# Patient Record
Sex: Female | Born: 1970 | Race: White | Hispanic: No | Marital: Single | Smoking: Never smoker
Health system: Southern US, Community
[De-identification: ages and names within clinical notes are randomized; demographics above are authoritative.]

## PROBLEM LIST (undated history)

## (undated) DIAGNOSIS — E611 Iron deficiency: Secondary | ICD-10-CM

## (undated) DIAGNOSIS — J45909 Unspecified asthma, uncomplicated: Secondary | ICD-10-CM

## (undated) DIAGNOSIS — E785 Hyperlipidemia, unspecified: Secondary | ICD-10-CM

## (undated) DIAGNOSIS — K317 Polyp of stomach and duodenum: Secondary | ICD-10-CM

## (undated) DIAGNOSIS — J309 Allergic rhinitis, unspecified: Secondary | ICD-10-CM

## (undated) DIAGNOSIS — R195 Other fecal abnormalities: Secondary | ICD-10-CM

## (undated) DIAGNOSIS — B019 Varicella without complication: Secondary | ICD-10-CM

## (undated) DIAGNOSIS — D649 Anemia, unspecified: Secondary | ICD-10-CM

## (undated) DIAGNOSIS — T7840XA Allergy, unspecified, initial encounter: Secondary | ICD-10-CM

## (undated) DIAGNOSIS — K811 Chronic cholecystitis: Secondary | ICD-10-CM

## (undated) DIAGNOSIS — I7300 Raynaud's syndrome without gangrene: Secondary | ICD-10-CM

## (undated) DIAGNOSIS — I10 Essential (primary) hypertension: Secondary | ICD-10-CM

## (undated) DIAGNOSIS — U071 COVID-19: Secondary | ICD-10-CM

## (undated) DIAGNOSIS — E119 Type 2 diabetes mellitus without complications: Secondary | ICD-10-CM

## (undated) DIAGNOSIS — D126 Benign neoplasm of colon, unspecified: Secondary | ICD-10-CM

## (undated) DIAGNOSIS — N390 Urinary tract infection, site not specified: Secondary | ICD-10-CM

## (undated) DIAGNOSIS — K219 Gastro-esophageal reflux disease without esophagitis: Secondary | ICD-10-CM

## (undated) DIAGNOSIS — E215 Disorder of parathyroid gland, unspecified: Secondary | ICD-10-CM

## (undated) DIAGNOSIS — K760 Fatty (change of) liver, not elsewhere classified: Secondary | ICD-10-CM

## (undated) DIAGNOSIS — A0811 Acute gastroenteropathy due to Norwalk agent: Secondary | ICD-10-CM

## (undated) HISTORY — DX: Varicella without complication: B01.9

## (undated) HISTORY — DX: Fatty (change of) liver, not elsewhere classified: K76.0

## (undated) HISTORY — DX: Benign neoplasm of colon, unspecified: D12.6

## (undated) HISTORY — DX: Allergic rhinitis, unspecified: J30.9

## (undated) HISTORY — DX: Unspecified asthma, uncomplicated: J45.909

## (undated) HISTORY — DX: Type 2 diabetes mellitus without complications: E11.9

## (undated) HISTORY — DX: Allergy, unspecified, initial encounter: T78.40XA

## (undated) HISTORY — DX: Urinary tract infection, site not specified: N39.0

## (undated) HISTORY — DX: Iron deficiency: E61.1

## (undated) HISTORY — DX: Anemia, unspecified: D64.9

## (undated) HISTORY — DX: Chronic cholecystitis: K81.1

## (undated) HISTORY — DX: Gastro-esophageal reflux disease without esophagitis: K21.9

## (undated) HISTORY — PX: BREAST CYST EXCISION: SHX579

## (undated) HISTORY — DX: Hyperlipidemia, unspecified: E78.5

## (undated) HISTORY — PX: CHOLECYSTECTOMY: SHX55

## (undated) HISTORY — PX: POLYPECTOMY: SHX149

## (undated) HISTORY — DX: Disorder of parathyroid gland, unspecified: E21.5

## (undated) HISTORY — DX: COVID-19: U07.1

## (undated) HISTORY — DX: Raynaud's syndrome without gangrene: I73.00

## (undated) HISTORY — DX: Other fecal abnormalities: R19.5

## (undated) HISTORY — PX: COLONOSCOPY: SHX174

## (undated) HISTORY — DX: Essential (primary) hypertension: I10

## (undated) HISTORY — DX: Polyp of stomach and duodenum: K31.7

---

## 2000-09-27 ENCOUNTER — Other Ambulatory Visit: Admission: RE | Admit: 2000-09-27 | Discharge: 2000-09-27 | Payer: Self-pay | Admitting: Obstetrics and Gynecology

## 2000-09-27 ENCOUNTER — Other Ambulatory Visit: Admission: RE | Admit: 2000-09-27 | Discharge: 2000-09-27 | Payer: Self-pay | Admitting: Obstetrics & Gynecology

## 2001-02-23 ENCOUNTER — Encounter: Admission: RE | Admit: 2001-02-23 | Discharge: 2001-05-24 | Payer: Self-pay | Admitting: Obstetrics and Gynecology

## 2001-05-01 ENCOUNTER — Inpatient Hospital Stay (HOSPITAL_COMMUNITY): Admission: AD | Admit: 2001-05-01 | Discharge: 2001-05-01 | Payer: Self-pay | Admitting: Obstetrics & Gynecology

## 2001-05-05 ENCOUNTER — Inpatient Hospital Stay (HOSPITAL_COMMUNITY): Admission: AD | Admit: 2001-05-05 | Discharge: 2001-05-07 | Payer: Self-pay | Admitting: Obstetrics & Gynecology

## 2001-12-26 ENCOUNTER — Other Ambulatory Visit: Admission: RE | Admit: 2001-12-26 | Discharge: 2001-12-26 | Payer: Self-pay | Admitting: Obstetrics and Gynecology

## 2003-02-05 ENCOUNTER — Other Ambulatory Visit: Admission: RE | Admit: 2003-02-05 | Discharge: 2003-02-05 | Payer: Self-pay | Admitting: Internal Medicine

## 2006-01-18 ENCOUNTER — Ambulatory Visit (HOSPITAL_COMMUNITY): Admission: RE | Admit: 2006-01-18 | Discharge: 2006-01-18 | Payer: Self-pay | Admitting: Obstetrics & Gynecology

## 2006-02-17 ENCOUNTER — Encounter: Admission: RE | Admit: 2006-02-17 | Discharge: 2006-02-17 | Payer: Self-pay | Admitting: Obstetrics & Gynecology

## 2006-02-22 ENCOUNTER — Ambulatory Visit: Payer: Self-pay | Admitting: Cardiology

## 2006-03-06 ENCOUNTER — Ambulatory Visit: Payer: Self-pay | Admitting: Family Medicine

## 2006-05-16 HISTORY — PX: TUBAL LIGATION: SHX77

## 2006-08-30 ENCOUNTER — Encounter: Admission: RE | Admit: 2006-08-30 | Discharge: 2006-08-30 | Payer: Self-pay | Admitting: Obstetrics & Gynecology

## 2006-09-01 ENCOUNTER — Encounter: Admission: RE | Admit: 2006-09-01 | Discharge: 2006-09-01 | Payer: Self-pay | Admitting: Obstetrics & Gynecology

## 2007-05-17 HISTORY — PX: PARATHYROIDECTOMY: SHX19

## 2007-12-28 ENCOUNTER — Ambulatory Visit: Payer: Self-pay | Admitting: Anesthesiology

## 2008-03-06 ENCOUNTER — Encounter: Admission: RE | Admit: 2008-03-06 | Discharge: 2008-03-06 | Payer: Self-pay | Admitting: Obstetrics & Gynecology

## 2008-08-24 ENCOUNTER — Emergency Department (HOSPITAL_COMMUNITY): Admission: EM | Admit: 2008-08-24 | Discharge: 2008-08-24 | Payer: Self-pay | Admitting: Emergency Medicine

## 2010-08-25 LAB — URINALYSIS, ROUTINE W REFLEX MICROSCOPIC
Bilirubin Urine: NEGATIVE
Glucose, UA: NEGATIVE mg/dL
Hgb urine dipstick: NEGATIVE
Ketones, ur: NEGATIVE mg/dL
Specific Gravity, Urine: 1.013 (ref 1.005–1.030)
pH: 6 (ref 5.0–8.0)

## 2010-10-01 NOTE — Assessment & Plan Note (Signed)
[facility]                              CARDIOLOGY OFFICE NOTE   Nicole Bryant, Nicole Bryant             MRN:          [id number]  DATE:02/22/2006                            DOB:          Oct 11, 1970    REFERRING PHYSICIAN:  Freddy Finner, M.D.   REFERRING PHYSICIAN:  Freddy Finner, M.D.   REASON FOR CONSULTATION:  Dyslipidemia.   HISTORY OF PRESENT ILLNESS:  Nicole Bryant is a pleasant 40 year old  woman with a reported history of previously documented mild dyslipidemia,  although on no specific medical therapy.  She has had no problems with  hypertension or documented type 2 diabetes mellitus, although she does have  prior history of gestational diabetes.  She has had gastroesophageal reflux  and has used Pepcid over the counter with relief.  She states that she  recently underwent a tubal ligation and has not been exercising regularly,  although prior to that had a personal trainer and was doing exercise most  days of the week with a subsequent 15 pound weight loss and feeling well.  She had a recent lipid profile obtained as a matter of screening showing a  total cholesterol of 201, triglycerides 116, HDL 37, LDL 141 and a random  glucose of 104.  Thyroid studies were normal.  I reviewed these with her  today.  Her electrocardiogram showed sinus rhythm with sinus arrhythmia and  possible left atrial enlargement.   She has no exertional angina or limiting dyspnea on exertion.  Today, we  talked about lifestyle modification including strategies for weight loss  through diet and exercise.  We talked about her propensity for developing  the metabolic syndrome and I reviewed with her strategies for reducing her  risk down the road.   ALLERGIES:  SULFA.   PRESENT MEDICATIONS:  Over-the-counter Pepcid.   PAST MEDICAL HISTORY:  As outlined in the history of present illness.  She  is status post cyst removal from her left breast in  1991.   SOCIAL HISTORY:  The patient is married and has one child.  She is a  [employer] trader.  She denies any tobacco and alcohol use.  She drinks one to two  caffeinated beverages daily.   FAMILY HISTORY:  Noncontributory for premature cardiovascular disease.   REVIEW OF SYSTEMS:  As per the history of present illness.   PHYSICAL EXAMINATION:  Blood pressure 151/88, heart rate 83 and regular,  weight is not recorded.  The patient is overweight and in no acute distress.  HEENT:  Conjunctivae and lids are normal.  Pharynx is clear.  Neck is supple  without obvious jugular venous pressure or loud bruits.  LUNGS:  Clear without labored breathing.  CARDIAC:  Reveals a regular rate and rhythm without loud murmur or gallop.  EXTREMITIES:  Exhibit no significant pitting edema.   IMPRESSION/RECOMMENDATIONS:  1. Mild dyslipidemia as discussed above with low HDL and mildly elevated      LDL, although with normal triglycerides.  Random glucose was 104.  At      this point, I have discussed her propensity for developing metabolic      syndrome  and encouraged her to get back to a regular exercise regimen      which she had already dissipated doing.  We also talked about a healthy      diet with a goal of weight loss.  I would anticipate that she should      have a follow-up lipid panel most likely over the next six months.      Would not recommend any specific medical therapy at this particular      time.  She will plan to continue to follow up with Dr. Jennette Kettle.  If her      lipid status worsens or does not come under better control with      conservative measures, she can certainly come back for reassessment.  2. Blood pressure elevated today, although no longstanding history of      hypertension:  This should also be followed.  I have encouraged her to      continue to see Dr. Jennette Kettle for routine checks.       Jonelle Sidle, MD     SGM/MedQ  DD:  02/22/2006  DT:  02/23/2006  Job #:   [id number]   cc:   Freddy Finner, M.D.

## 2010-10-01 NOTE — Op Note (Signed)
NAMECHRISTIANN, Nicole Bryant      ACCOUNT NO.:  [id number]   MEDICAL RECORD NO.:  [id number]          PATIENT TYPE:  AMB   LOCATION:  [facility]                           FACILITY:  [facility]   PHYSICIAN:  Freddy Finner, M.D.   DATE OF BIRTH:  [birth year]   DATE OF PROCEDURE:  01/18/2006  DATE OF DISCHARGE:                                 OPERATIVE REPORT   PREOPERATIVE DIAGNOSIS:  Request for voluntary sterilization.   POSTOPERATIVE DIAGNOSES:  1. Request for voluntary sterilization.  2. Boggy enlargement of uterus with no other evidence of pelvic pathology      or upper abdominal abnormality.   OPERATIVE PROCEDURE:  Laparoscopic tubal occlusion with Filshie clips.   ANESTHESIA:  General.   SURGEON:  Freddy Finner, M.D.   INTRAOPERATIVE COMPLICATIONS:  None.   Patient is a 40 year old with one living child who has requested permanent  surgical sterilization.  Other options have been offered to her, including  an IUD, which she has refused.  She is admitted now for laparoscopic  sterilization.   She was admitted on the morning of surgery and brought to the operating room  and placed under adequate general anesthesia, placed in the dorsal lithotomy  position using the Allen stirrups.  Betadine prep of the abdomen, perineum,  and vagina was carried out in the usual fashion.  The bladder was evacuated  with a Robinson catheter.  Hulka tenaculum was attached to the cervix under  direct visualization.  Sterile drapes were applied.  A small internal  umbilical skin incision was made, through which the 11 mm nonbloody  disposable trocar was introduced while elevating the anterior abdominal wall  manually.  Direct inspection revealed adequate placement with no evidence of  injury on entry.  Pneumoperitoneum was allowed to accumulate with carbon  dioxide gas.  Systematic examination of the pelvic and abdominal contents  were carried out, and findings are noted above and recorded.  Photographs  were obtained in the office record.  The isthmus portion of each fallopian  tube was occluded with a Filshie clip without difficulty or without intra-  abdominal bleeding.  Carbon dioxide gas was then allowed to escape from the  abdomen.  The skin incision was closed with interrupted subcuticular sutures  of 3-0 Dexon, and 0.5% plain Marcaine was injected through the incision  sites for postoperative analgesia.  Toradol was given IV and IM 30 mg at  each location.  Immediately on completion of the procedure, she was taken to  the recovery room in good condition.  She is given Vicodin to be taken as  needed for postoperative pain.  She is to have routine outpatient surgical  instruction.  She is to follow up in the office in approximately two weeks  for postoperative visit.     Freddy Finner, M.D.  Electronically Signed    WRN/MEDQ  D:  01/18/2006  T:  01/18/2006  Job:  [id number]

## 2014-08-27 ENCOUNTER — Ambulatory Visit
Admit: 2014-08-27 | Disposition: A | Discharge status: Home / Self Care | Payer: Self-pay | Attending: Family Medicine | Admitting: Family Medicine

## 2014-09-30 ENCOUNTER — Encounter: Payer: Self-pay | Admitting: *Deleted

## 2015-06-29 ENCOUNTER — Ambulatory Visit: Payer: Self-pay | Admitting: Family Medicine

## 2015-07-06 ENCOUNTER — Ambulatory Visit: Payer: Self-pay | Admitting: Family Medicine

## 2015-07-07 ENCOUNTER — Encounter: Payer: Self-pay | Admitting: Family Medicine

## 2015-07-07 ENCOUNTER — Ambulatory Visit (INDEPENDENT_AMBULATORY_CARE_PROVIDER_SITE_OTHER): Payer: BLUE CROSS/BLUE SHIELD | Admitting: Family Medicine

## 2015-07-07 VITALS — BP 130/80 | HR 81 | Temp 98.5°F | Ht 62.5 in | Wt 194.8 lb

## 2015-07-07 DIAGNOSIS — R1013 Epigastric pain: Secondary | ICD-10-CM | POA: Diagnosis not present

## 2015-07-07 DIAGNOSIS — R0602 Shortness of breath: Secondary | ICD-10-CM | POA: Diagnosis not present

## 2015-07-07 DIAGNOSIS — E119 Type 2 diabetes mellitus without complications: Secondary | ICD-10-CM | POA: Diagnosis not present

## 2015-07-07 DIAGNOSIS — R7989 Other specified abnormal findings of blood chemistry: Secondary | ICD-10-CM

## 2015-07-07 LAB — LIPID PANEL
CHOL/HDL RATIO: 6
CHOLESTEROL: 214 mg/dL — AB (ref 0–200)
HDL: 37.8 mg/dL — ABNORMAL LOW (ref >39.00)
NONHDL: 176.49
Triglycerides: 320 mg/dL — ABNORMAL HIGH (ref 0.0–149.0)
VLDL: 64 mg/dL — ABNORMAL HIGH (ref 0.0–40.0)

## 2015-07-07 LAB — CBC
HCT: 42.3 % (ref 36.0–46.0)
HEMOGLOBIN: 13.9 g/dL (ref 12.0–15.0)
MCHC: 32.9 g/dL (ref 30.0–36.0)
MCV: 83.6 fl (ref 78.0–100.0)
PLATELETS: 244 10*3/uL (ref 150.0–400.0)
RBC: 5.07 Mil/uL (ref 3.87–5.11)
RDW: 14.5 % (ref 11.5–15.5)
WBC: 7 10*3/uL (ref 4.0–10.5)

## 2015-07-07 LAB — COMPREHENSIVE METABOLIC PANEL
ALK PHOS: 59 U/L (ref 39–117)
ALT: 15 U/L (ref 0–35)
AST: 16 U/L (ref 0–37)
Albumin: 4.5 g/dL (ref 3.5–5.2)
BUN: 8 mg/dL (ref 6–23)
CO2: 29 mEq/L (ref 19–32)
Calcium: 9.6 mg/dL (ref 8.4–10.5)
Chloride: 101 mEq/L (ref 96–112)
Creatinine, Ser: 0.76 mg/dL (ref 0.40–1.20)
GFR: 87.58 mL/min (ref >60.00)
GLUCOSE: 195 mg/dL — AB (ref 70–99)
POTASSIUM: 4.2 meq/L (ref 3.5–5.1)
SODIUM: 139 meq/L (ref 135–145)
TOTAL PROTEIN: 7.4 g/dL (ref 6.0–8.3)
Total Bilirubin: 0.8 mg/dL (ref 0.2–1.2)

## 2015-07-07 LAB — VITAMIN D 25 HYDROXY (VIT D DEFICIENCY, FRACTURES): VITD: 57.13 ng/mL (ref 30.00–100.00)

## 2015-07-07 LAB — HEMOGLOBIN A1C: Hgb A1c MFr Bld: 7.6 % — ABNORMAL HIGH (ref 4.6–6.5)

## 2015-07-07 LAB — LDL CHOLESTEROL, DIRECT: LDL DIRECT: 130 mg/dL

## 2015-07-07 LAB — LIPASE: Lipase: 21 U/L (ref 11.0–59.0)

## 2015-07-07 LAB — POCT URINE PREGNANCY: PREG TEST UR: NEGATIVE

## 2015-07-07 MED ORDER — FLUTICASONE PROPIONATE 50 MCG/ACT NA SUSP: 2.0000 | Freq: Every day | NASAL | Status: DC

## 2015-07-07 MED ORDER — PANTOPRAZOLE SODIUM 40 MG PO TBEC: 40.0000 mg | DELAYED_RELEASE_TABLET | Freq: Every day | ORAL | Status: DC

## 2015-07-07 NOTE — Assessment & Plan Note (Addendum)
History is most consistent with gastritis versus GERD, though patient does report a mild sensation of shortness of breath with this. Given this an EKG was obtained and was reassuring. Doubt cardiac cause given history of reflux and reassuring EKG and atypical symptoms. Benign lung exam. No history of DVT or PE. No leg swelling. Unlikely VTE. Mild epigastric tenderness on abdominal exam. We will check a CMP, lipase, and CBC to evaluate this further. Urine pregnancy was negative. We will start her on Protonix 40 mg daily. She'll continue to monitor. She is given return precautions.

## 2015-07-07 NOTE — Patient Instructions (Signed)
Nice to see you. We will treat your stomach discomfort with Protonix. Please continue to monitor this. We will also check some lab work to evaluate this further. If you develop worsening pain, nausea, vomiting, diarrhea, blood in her stool, urinary symptoms, vaginal bleeding, chest pain, shortness of breath, or any new or changing symptoms please seek medical attention.

## 2015-07-07 NOTE — Progress Notes (Signed)
Pre visit review using our clinic review tool, if applicable. No additional management support is needed unless otherwise documented below in the visit note. 

## 2015-07-07 NOTE — Assessment & Plan Note (Signed)
Reportedly diet controlled. We'll check an A1c and lipid panel today.

## 2015-07-07 NOTE — Progress Notes (Signed)
Patient ID: Nicole Bryant, female   DOB: 1970/11/16, 45 y.o.   MRN: [id number]  [name], MD Phone: [phone]  Nicole Bryant is a 45 y.o. female who presents today for new patient visit.  Epigastric discomfort: Patient notes history of GERD and gastritis. She notes for the last several days her upper abdomen has felt like it is on fire. She notes nausea with this. She notes no reflux symptoms though does have mild mucus and cough with this. States this feels like her reflux. She notes mild shortness of breath as well that she describes as needing to take a deep breath when she feels the pain in her stomach. She also notes mild shortness of breath with activity and history of bronchitis. She denies chest pain. She notes the stomach discomfort is mildly worse when active. She notes monthly periods with her last period being last Thursday. She takes Prilosec 40 mg daily for this. No history of cardiac issues in her. Does have family history of MI in a grandfather and uncle in their 13s. Father had a brain aneurysm. She takes Aleve daily. No shortness of breath at this time.  DIABETES Disease Monitoring: Blood Sugar ranges-not checking Polyuria/phagia/dipsia- no      Visual problems- no Not currently on medications.   Active Ambulatory Problems    Diagnosis Date Noted  . Abdominal pain, epigastric 07/07/2015  . Diabetes ([place]) 07/07/2015   Resolved Ambulatory Problems    Diagnosis Date Noted  . No Resolved Ambulatory Problems   Past Medical History  Diagnosis Date  . Chickenpox   . GERD (gastroesophageal reflux disease)   . Allergic rhinitis   . Parathyroid abnormality ([place])   . UTI (lower urinary tract infection)     Family History  Problem Relation Age of Onset  . Stroke Father   . Heart disease      Grandfather, uncle  . Hypertension    . Diabetes      Social History   Social History  . Marital Status: Married    Spouse Name: N/A  . Number of  Children: N/A  . Years of Education: N/A   Occupational History  . Not on file.   Social History Main Topics  . Smoking status: Never Smoker   . Smokeless tobacco: Not on file  . Alcohol Use: No  . Drug Use: No  . Sexual Activity: Not on file   Other Topics Concern  . Not on file   Social History Narrative  . No narrative on file    ROS   General:  Negative for nexplained weight loss, fever Skin: Negative for new or changing mole, sore that won't heal HEENT: Negative for trouble hearing, trouble seeing, ringing in ears, mouth sores, hoarseness, change in voice, dysphagia. CV:  Positive for dyspnea, Negative for chest pain, edema, palpitations Resp: Negative for cough, hemoptysis GI: Positive for abdominal pain and nausea, Negative for vomiting, diarrhea, constipation, melena, hematochezia. GU: Negative for dysuria, incontinence, urinary hesitance, hematuria, vaginal or penile discharge, polyuria, sexual difficulty, lumps in testicle or breasts MSK: Negative for muscle cramps or aches, joint pain or swelling Neuro: Negative for headaches, weakness, numbness, dizziness, passing out/fainting Psych: Negative for depression, anxiety, memory problems  Objective  Physical Exam Filed Vitals:   07/07/15 1026  BP: 130/80  Pulse: 81  Temp: 98.5 F (36.9 C)    BP Readings from Last 3 Encounters:  07/07/15 130/80   Wt Readings from Last 3 Encounters:  07/07/15 194  lb 12.8 oz (88.361 kg)    Physical Exam  Constitutional: She is well-developed, well-nourished, and in no distress.  HENT:  Head: Normocephalic.  Right Ear: External ear normal.  Left Ear: External ear normal.  Mouth/Throat: Oropharynx is clear and moist. No oropharyngeal exudate.  Eyes: Conjunctivae are normal. Pupils are equal, round, and reactive to light.  Neck: Neck supple.  Cardiovascular: Normal rate, regular rhythm and normal heart sounds.  Exam reveals no gallop and no friction rub.   No murmur  heard. Pulmonary/Chest: Effort normal and breath sounds normal. No respiratory distress. She has no wheezes. She has no rales.  Abdominal: Soft. Bowel sounds are normal. She exhibits no distension. There is tenderness (mild epigastric). There is no rebound and no guarding.  Musculoskeletal: She exhibits no edema.  Lymphadenopathy:    She has no cervical adenopathy.  Neurological: She is alert. Gait normal.  Skin: Skin is warm and dry. She is not diaphoretic.  Psychiatric: Mood and affect normal.   EKG: Normal sinus rhythm, rate 72, no ST or T-wave changes  Assessment/Plan:   Abdominal pain, epigastric History is most consistent with gastritis versus GERD, though patient does report a mild sensation of shortness of breath with this. Given this an EKG was obtained and was reassuring. Doubt cardiac cause given history of reflux and reassuring EKG and atypical symptoms. Benign lung exam. No history of DVT or PE. No leg swelling. Unlikely VTE. Mild epigastric tenderness on abdominal exam. We will check a CMP, lipase, and CBC to evaluate this further. Urine pregnancy was negative. We will start her on Protonix 40 mg daily. She'll continue to monitor. She is given return precautions.  Diabetes ([place]) Reportedly diet controlled. We'll check an A1c and lipid panel today.    Orders Placed This Encounter  Procedures  . Comp Met (CMET)  . Lipase  . CBC  . HgB A1c  . Lipid Profile  . Vitamin D (25 hydroxy)  . POCT urine pregnancy  . EKG 12-Lead    [name]

## 2015-07-08 ENCOUNTER — Telehealth: Payer: Self-pay | Admitting: Family Medicine

## 2015-07-08 NOTE — Telephone Encounter (Signed)
Spoke with patient and she is still having the abdominal pain. She said that when she eats or drinks anything that she is still having the burning. When she moves she is feeling nauseous. She took the Protonix yesterday and has taken it ago today.

## 2015-07-08 NOTE — Telephone Encounter (Signed)
Patients symptoms are likely related to gastritis. Her lab work from yesterday was unrevealing for a cause. She had no abnormalities of her pancreas or liver. Please ensure that her symptoms are the same as they were previously and have not changed. She could try taking Tums to see if this would help or we can send in Carafate for the patient to try. We could also consider obtaining an ultrasound of her stomach to evaluate further. Please additionally let her know that her cholesterol was elevated and her triglycerides were elevated. Given that she has diabetes she would benefit from treatment with a statin to decrease her cholesterol. Please see if she is willing to start on this. She would additionally benefit from going on medication for her diabetes. Her A1c was 7.6. Please see if she is willing to start metformin. Thanks.

## 2015-07-08 NOTE — Telephone Encounter (Signed)
Noted. If she worsens or develops new symptoms she should be re-evaluated. Thanks.

## 2015-07-08 NOTE — Telephone Encounter (Signed)
Patient is going to try the Tums for right now. Her symptoms have not gotten any worse from yesterday. She would like to hold off on starting the Metformin and cholesterol medication until she comes in for her follow up appointment.

## 2015-07-08 NOTE — Telephone Encounter (Signed)
Pt called back she was seen yesterday and states she is not feeling better. Pt is feeling nauseous. Call pt @ [phone]. Thank you!

## 2015-07-14 ENCOUNTER — Ambulatory Visit (INDEPENDENT_AMBULATORY_CARE_PROVIDER_SITE_OTHER): Payer: BLUE CROSS/BLUE SHIELD | Admitting: Family Medicine

## 2015-07-14 ENCOUNTER — Encounter: Payer: Self-pay | Admitting: Family Medicine

## 2015-07-14 ENCOUNTER — Encounter: Payer: Self-pay | Admitting: *Deleted

## 2015-07-14 ENCOUNTER — Telehealth: Payer: Self-pay

## 2015-07-14 VITALS — BP 128/86 | HR 88 | Temp 98.3°F | Ht 62.5 in | Wt 192.2 lb

## 2015-07-14 DIAGNOSIS — R1013 Epigastric pain: Secondary | ICD-10-CM | POA: Diagnosis not present

## 2015-07-14 MED ORDER — SUCRALFATE 1 GM/10ML PO SUSP: 1.0000 g | Freq: Three times a day (TID) | ORAL | Status: DC

## 2015-07-14 MED ORDER — SUCRALFATE 1 G PO TABS: 1.0000 g | ORAL_TABLET | Freq: Three times a day (TID) | ORAL | Status: DC

## 2015-07-14 NOTE — Telephone Encounter (Signed)
Todd with [facility] left v/m carafate suspension is needing PA and tablet form should not require PA; Todd request resubmit carafate in tablet form.

## 2015-07-14 NOTE — Telephone Encounter (Signed)
I have not received any PA request from pharmacy for Carafate.  Will await fax.

## 2015-07-14 NOTE — Progress Notes (Signed)
Pre visit review using our clinic review tool, if applicable. No additional management support is needed unless otherwise documented below in the visit note. 

## 2015-07-14 NOTE — Addendum Note (Signed)
Addended by: Ellamae Sia on: 07/14/2015 10:21 AM   Modules accepted: Orders

## 2015-07-14 NOTE — Progress Notes (Signed)
   Subjective:    Patient ID: Nicole Bryant, female    DOB: 21-Feb-1971, 45 y.o.   MRN: [id number]  HPI   45 year old female  Pt  of Dr. Sallee Lange  with history of diabetes, GERD presents with continue abdominal pain.   She was seen by her PCP on 2/21 with similar complaints when she established care. She was taking aleve daily. At that time, she had epigastric pain, cough and mucus in throat, no reflux symptoms otherwise.  She was on prilosec 40 mg but was changed to protonix at that time.  CMP, cbc and lipase were normal. Nml EKG  Today she reports she has no  improvement on protonix ( no SE). Some worsening of symptoms  At times but  Definitely no better.  She has been eating bland diet, nonsmoker.  She has also tried pepcid in addition with no benefit.  Still with epigastric pain, metal taste in her mouth, no chest pain.  Nausea. No emesis, no diarrhea.    No endoscopy in past. Never seen GI.  Social History /Family History/Past Medical History reviewed and updated if needed.   Review of Systems  Constitutional: Negative for fever and fatigue.  HENT: Negative for ear pain.   Eyes: Negative for pain.  Respiratory: Negative for chest tightness and shortness of breath.   Cardiovascular: Negative for chest pain, palpitations and leg swelling.  Gastrointestinal: Positive for abdominal pain. Negative for blood in stool.  Genitourinary: Negative for dysuria.       Objective:   Physical Exam  Constitutional: Vital signs are normal. She appears well-developed and well-nourished. She is cooperative.  Non-toxic appearance. She does not appear ill. No distress.  HENT:  Head: Normocephalic.  Right Ear: Hearing, tympanic membrane, external ear and ear canal normal. Tympanic membrane is not erythematous, not retracted and not bulging.  Left Ear: Hearing, tympanic membrane, external ear and ear canal normal. Tympanic membrane is not erythematous, not retracted and not bulging.    Nose: No mucosal edema or rhinorrhea. Right sinus exhibits no maxillary sinus tenderness and no frontal sinus tenderness. Left sinus exhibits no maxillary sinus tenderness and no frontal sinus tenderness.  Mouth/Throat: Uvula is midline, oropharynx is clear and moist and mucous membranes are normal.  Eyes: Conjunctivae, EOM and lids are normal. Pupils are equal, round, and reactive to light. Lids are everted and swept, no foreign bodies found.  Neck: Trachea normal and normal range of motion. Neck supple. Carotid bruit is not present. No thyroid mass and no thyromegaly present.  Cardiovascular: Normal rate, regular rhythm, S1 normal, S2 normal, normal heart sounds, intact distal pulses and normal pulses.  Exam reveals no gallop and no friction rub.   No murmur heard. Pulmonary/Chest: Effort normal and breath sounds normal. No tachypnea. No respiratory distress. She has no decreased breath sounds. She has no wheezes. She has no rhonchi. She has no rales.  Abdominal: Soft. Normal appearance and bowel sounds are normal. There is tenderness in the right upper quadrant and epigastric area.  Neurological: She is alert.  Skin: Skin is warm, dry and intact. No rash noted.  Psychiatric: Her speech is normal and behavior is normal. Judgment and thought content normal. Her mood appears not anxious. Cognition and memory are normal. She does not exhibit a depressed mood.          Assessment & Plan:

## 2015-07-14 NOTE — Assessment & Plan Note (Signed)
Will eval for Hypylori. Reviewed lifestyle changes. Start carafate, continue prpotonix. If neg testing and not improving recommend eval for gallbladder disease with RUQ [place] versus endoscopy.

## 2015-07-14 NOTE — Telephone Encounter (Signed)
Pt left v/m; pts pharmacy advised pt needs PA for carafate to be done; pt request cb with status of PA faxed from pharmacy; pt wants to pick up med ASAP.

## 2015-07-14 NOTE — Addendum Note (Signed)
Addended by: Marchia Bond on: 07/14/2015 02:10 PM   Modules accepted: Orders

## 2015-07-14 NOTE — Patient Instructions (Addendum)
Continue protonix. Start carafate to coat stomach.  Stop at lab on way put for H pylori test.  Avoid soda, alcohol, spicy, tomato, citris, peppermint., caffeine, chocolate.  Small meals size, don't wear tight clothing, work on weight loss. If negative, H pylori will plan on ultrasound eval.

## 2015-07-15 DIAGNOSIS — A0811 Acute gastroenteropathy due to Norwalk agent: Secondary | ICD-10-CM

## 2015-07-15 HISTORY — DX: Acute gastroenteropathy due to Norwalk agent: A08.11

## 2015-07-15 LAB — HELICOBACTER PYLORI  SPECIAL ANTIGEN: H. PYLORI Antigen: NOT DETECTED

## 2015-07-15 NOTE — Telephone Encounter (Signed)
[facility] notified Rx was changed to tablet form.  No PA required for tablets.

## 2015-07-16 ENCOUNTER — Telehealth: Payer: Self-pay | Admitting: Family Medicine

## 2015-07-16 NOTE — Telephone Encounter (Signed)
Pt called requesting call back with lab results. Please advise.  [phone]

## 2015-07-21 ENCOUNTER — Other Ambulatory Visit: Payer: Self-pay | Admitting: Family Medicine

## 2015-07-21 MED ORDER — SUCRALFATE 1 G PO TABS: 1.0000 g | ORAL_TABLET | Freq: Three times a day (TID) | ORAL | Status: DC

## 2015-07-21 NOTE — Telephone Encounter (Signed)
Last office visit 07/14/2015 with Dr. Diona Browner.  Last filled 07/14/2015 for #30 with no refills with instructions to take four times a day.  Refill?

## 2015-07-29 ENCOUNTER — Other Ambulatory Visit: Payer: Self-pay | Admitting: Family Medicine

## 2015-07-29 MED ORDER — SUCRALFATE 1 G PO TABS: 1.0000 g | ORAL_TABLET | Freq: Three times a day (TID) | ORAL | Status: DC

## 2015-07-29 NOTE — Telephone Encounter (Signed)
We will refill. Patient needs follow-up appointment for this issue.

## 2015-07-30 ENCOUNTER — Encounter: Payer: Self-pay | Admitting: Family Medicine

## 2015-08-03 ENCOUNTER — Encounter: Payer: Self-pay | Admitting: Family Medicine

## 2015-08-03 ENCOUNTER — Ambulatory Visit (INDEPENDENT_AMBULATORY_CARE_PROVIDER_SITE_OTHER): Payer: BLUE CROSS/BLUE SHIELD | Admitting: Family Medicine

## 2015-08-03 VITALS — BP 124/82 | HR 78 | Temp 98.5°F | Ht 65.25 in | Wt 191.2 lb

## 2015-08-03 DIAGNOSIS — R195 Other fecal abnormalities: Secondary | ICD-10-CM

## 2015-08-03 DIAGNOSIS — R1013 Epigastric pain: Secondary | ICD-10-CM | POA: Diagnosis not present

## 2015-08-03 DIAGNOSIS — K5900 Constipation, unspecified: Secondary | ICD-10-CM

## 2015-08-03 DIAGNOSIS — E119 Type 2 diabetes mellitus without complications: Secondary | ICD-10-CM

## 2015-08-03 DIAGNOSIS — E785 Hyperlipidemia, unspecified: Secondary | ICD-10-CM

## 2015-08-03 HISTORY — DX: Other fecal abnormalities: R19.5

## 2015-08-03 MED ORDER — RELION LANCETS STANDARD 21G MISC: Status: DC

## 2015-08-03 MED ORDER — PANTOPRAZOLE SODIUM 40 MG PO TBEC: 40.0000 mg | DELAYED_RELEASE_TABLET | Freq: Two times a day (BID) | ORAL | Status: DC

## 2015-08-03 MED ORDER — RELION CONFIRM GLUCOSE MONITOR W/DEVICE KIT: PACK | Status: AC

## 2015-08-03 MED ORDER — ATORVASTATIN CALCIUM 40 MG PO TABS: 40.0000 mg | ORAL_TABLET | Freq: Every day | ORAL | Status: DC

## 2015-08-03 MED ORDER — GLUCOSE BLOOD VI STRP: ORAL_STRIP | Status: DC

## 2015-08-03 MED ORDER — POLYETHYLENE GLYCOL 3350 17 GM/SCOOP PO POWD: 17.0000 g | Freq: Every day | ORAL | Status: DC

## 2015-08-03 NOTE — Progress Notes (Signed)
Pre visit review using our clinic review tool, if applicable. No additional management support is needed unless otherwise documented below in the visit note. 

## 2015-08-03 NOTE — Assessment & Plan Note (Signed)
Patient notes constipation since last week. No BM since Thursday. Normoactive bowel sounds. Constipation could be contributing to her abdominal discomfort. She will continue Colace and we will start her on MiraLAX. She did have positive FOBT which could be related to her constipation or other cause. We will refer her to GI for her positive FOBT and evaluation of her abdominal discomfort. She did recently have a CBC with a normal hemoglobin. Her vital signs are stable. Doubt significant blood loss. She will monitor for further bleeding. Given return precautions.

## 2015-08-03 NOTE — Assessment & Plan Note (Signed)
Patient with continued discomfort in her epigastric region. Mild tenderness on palpation. No peritoneal signs. Not associated with food intake so doubt gallbladder pathology. Suspect gastritis or gastric ulcer. We will increase Protonix to twice daily. Given persistence we will have the patient see GI. She will continue to monitor. She is given return precautions.

## 2015-08-03 NOTE — Assessment & Plan Note (Signed)
No gross blood on rectal exam. Positive FOBT. Recent normal hemoglobin. Stable vital signs make significant blood loss unlikely. Could be related to her constipation and potential hemorrhoids or fissure though no abnormalities seen on exam. We will refer to GI for further evaluation. Given return precautions.

## 2015-08-03 NOTE — Patient Instructions (Signed)
Nice to see you. We will refer you to GI for further evaluation. We will start her on MiraLAX for constipation. We will start her on Lipitor for your cholesterol. Please work on diet and exercise for your Diabetes. If you develop worsening abdominal pain, worsening nausea, or vomiting, diarrhea, blood in her stool, or any new or changing symptoms please seek medical attention.

## 2015-08-03 NOTE — Progress Notes (Signed)
Patient ID: Nicole Bryant, female   DOB: 01-Jul-1970, 45 y.o.   MRN: [id number]  [name], MD Phone: [phone]  Nicole Bryant is a 45 y.o. female who presents today for f/u.  DIABETES Disease Monitoring: Blood Sugar ranges-not checking Polyuria/phagia/dipsia- no    Currently working on diet and exercise. Not on medication.  HYPERLIPIDEMIA Symptoms Chest pain on exertion:  no   Leg claudication:   no Currently not taking medication. She is not eating a significant amount of food at this time given her nausea related to epigastric discomfort. She is not exercising either.  Epigastric pain: Patient notes this is unchanged from previously. She notes Carafate previously prescribed helped the first go round though has not helped since then. She is taking Protonix. She notes some nausea specifically with smelling food. She does not a sour taste in her throat as well. Discomfort is in the upper half of her abdomen in the epigastric area. Some cramping discomfort. No vomiting or diarrhea. Does note some constipation starting last Thursday. No BM since that time. Small quarter-sized amount of blood in her stool one time last week. Is taking Colace starting last night. No prior constipation. No urinary symptoms. LMP was last week. She notes abdominal discomfort does not worsen with eating. She does get more nauseous after eating. No right upper quadrant pain. She has had a tubal ligation.   PMH: nonsmoker    ROS see history of present illness  Objective  Physical Exam Filed Vitals:   08/03/15 1026  BP: 124/82  Pulse: 78  Temp: 98.5 F (36.9 C)    BP Readings from Last 3 Encounters:  08/03/15 124/82  07/14/15 128/86  07/07/15 130/80   Wt Readings from Last 3 Encounters:  08/03/15 191 lb 3.2 oz (86.728 kg)  07/14/15 192 lb 4 oz (87.204 kg)  07/07/15 194 lb 12.8 oz (88.361 kg)    Physical Exam  Constitutional: She is well-developed, well-nourished, and in  no distress.  HENT:  Head: Normocephalic and atraumatic.  Right Ear: External ear normal.  Left Ear: External ear normal.  Cardiovascular: Normal rate, regular rhythm and normal heart sounds.  Exam reveals no gallop and no friction rub.   No murmur heard. Pulmonary/Chest: Effort normal and breath sounds normal. No respiratory distress. She has no wheezes. She has no rales.  Abdominal: Soft. Bowel sounds are normal. She exhibits no distension. There is tenderness (mild epigastric discomfort). There is no rebound and no guarding.  Genitourinary: Guaiac positive stool.  Normal-appearing rectum, normal rectal tone, stool felt in the colon, no gross blood on exam  Musculoskeletal: She exhibits no edema.  Neurological: She is alert. Gait normal.  Skin: Skin is warm and dry. She is not diaphoretic.     Assessment/Plan: Please see individual problem list.  Abdominal pain, epigastric Patient with continued discomfort in her epigastric region. Mild tenderness on palpation. No peritoneal signs. Not associated with food intake so doubt gallbladder pathology. Suspect gastritis or gastric ulcer. We will increase Protonix to twice daily. Given persistence we will have the patient see GI. She will continue to monitor. She is given return precautions.  Diabetes ([place]) A1c slightly above goal. Patient is not currently taking any medications for this. I discussed starting medication though she wants to work on diet and exercise and recheck in 2 months. Advised on diet and exercise.  Hyperlipidemia Discussed elevated triglycerides and LDL cholesterol. Given her history of diabetes I discussed recommendation that she be placed on a  statin to decrease risk of cardiovascular events in the future. She is amenable to this. We will start her on Lipitor 40 mg daily.  Constipation Patient notes constipation since last week. No BM since Thursday. Normoactive bowel sounds. Constipation could be contributing to her  abdominal discomfort. She will continue Colace and we will start her on MiraLAX. She did have positive FOBT which could be related to her constipation or other cause. We will refer her to GI for her positive FOBT and evaluation of her abdominal discomfort. She did recently have a CBC with a normal hemoglobin. Her vital signs are stable. Doubt significant blood loss. She will monitor for further bleeding. Given return precautions.  Guaiac positive stools No gross blood on rectal exam. Positive FOBT. Recent normal hemoglobin. Stable vital signs make significant blood loss unlikely. Could be related to her constipation and potential hemorrhoids or fissure though no abnormalities seen on exam. We will refer to GI for further evaluation. Given return precautions.    Orders Placed This Encounter  Procedures  . Ambulatory referral to Gastroenterology    Referral Priority:  Routine    Referral Type:  Consultation    Referral Reason:  Specialty Services Required    Number of Visits Requested:  1    Meds ordered this encounter  Medications  . polyethylene glycol powder (GLYCOLAX/MIRALAX) powder    Sig: Take 17 g by mouth daily.    Dispense:  3350 g    Refill:  1  . atorvastatin (LIPITOR) 40 MG tablet    Sig: Take 1 tablet (40 mg total) by mouth daily.    Dispense:  90 tablet    Refill:  3  . RELION LANCETS STANDARD 21G MISC    Sig: Check your blood sugar once daily before breakfast.    Dispense:  200 each    Refill:  0  . Blood Glucose Monitoring Suppl (RELION CONFIRM GLUCOSE MONITOR) w/Device KIT    Sig: Check your blood glucose once daily before breakfast    Dispense:  1 kit    Refill:  0  . glucose blood (RELION GLUCOSE TEST STRIPS) test strip    Sig: Check your blood glucose once daily before breakfast    Dispense:  100 each    Refill:  12    [name], MD [place]

## 2015-08-03 NOTE — Assessment & Plan Note (Signed)
Discussed elevated triglycerides and LDL cholesterol. Given her history of diabetes I discussed recommendation that she be placed on a statin to decrease risk of cardiovascular events in the future. She is amenable to this. We will start her on Lipitor 40 mg daily.

## 2015-08-03 NOTE — Assessment & Plan Note (Signed)
A1c slightly above goal. Patient is not currently taking any medications for this. I discussed starting medication though she wants to work on diet and exercise and recheck in 2 months. Advised on diet and exercise.

## 2015-08-04 ENCOUNTER — Encounter: Payer: Self-pay | Admitting: Internal Medicine

## 2015-08-05 ENCOUNTER — Ambulatory Visit: Payer: BLUE CROSS/BLUE SHIELD | Admitting: Family Medicine

## 2015-08-10 ENCOUNTER — Ambulatory Visit (INDEPENDENT_AMBULATORY_CARE_PROVIDER_SITE_OTHER): Payer: BLUE CROSS/BLUE SHIELD | Admitting: Internal Medicine

## 2015-08-10 ENCOUNTER — Encounter: Payer: Self-pay | Admitting: Internal Medicine

## 2015-08-10 ENCOUNTER — Ambulatory Visit (INDEPENDENT_AMBULATORY_CARE_PROVIDER_SITE_OTHER): Payer: BLUE CROSS/BLUE SHIELD | Admitting: Family Medicine

## 2015-08-10 ENCOUNTER — Encounter: Payer: Self-pay | Admitting: Family Medicine

## 2015-08-10 ENCOUNTER — Other Ambulatory Visit (INDEPENDENT_AMBULATORY_CARE_PROVIDER_SITE_OTHER): Payer: BLUE CROSS/BLUE SHIELD

## 2015-08-10 VITALS — BP 138/88 | HR 88 | Ht 64.0 in | Wt 186.2 lb

## 2015-08-10 VITALS — BP 136/88 | HR 97 | Temp 99.0°F | Ht 62.5 in | Wt 187.0 lb

## 2015-08-10 DIAGNOSIS — R6881 Early satiety: Secondary | ICD-10-CM

## 2015-08-10 DIAGNOSIS — R194 Change in bowel habit: Secondary | ICD-10-CM | POA: Diagnosis not present

## 2015-08-10 DIAGNOSIS — R110 Nausea: Secondary | ICD-10-CM

## 2015-08-10 DIAGNOSIS — R195 Other fecal abnormalities: Secondary | ICD-10-CM | POA: Diagnosis not present

## 2015-08-10 DIAGNOSIS — R109 Unspecified abdominal pain: Secondary | ICD-10-CM

## 2015-08-10 DIAGNOSIS — R059 Cough, unspecified: Secondary | ICD-10-CM

## 2015-08-10 DIAGNOSIS — R509 Fever, unspecified: Secondary | ICD-10-CM

## 2015-08-10 DIAGNOSIS — J111 Influenza due to unidentified influenza virus with other respiratory manifestations: Secondary | ICD-10-CM | POA: Diagnosis not present

## 2015-08-10 DIAGNOSIS — R05 Cough: Secondary | ICD-10-CM | POA: Diagnosis not present

## 2015-08-10 LAB — POCT INFLUENZA A/B
INFLUENZA A, POC: POSITIVE — AB
Influenza B, POC: POSITIVE — AB

## 2015-08-10 LAB — IGA: IGA: 317 mg/dL (ref 68–378)

## 2015-08-10 LAB — TSH: TSH: 2.51 u[IU]/mL (ref 0.35–4.50)

## 2015-08-10 MED ORDER — OSELTAMIVIR PHOSPHATE 75 MG PO CAPS: 75.0000 mg | ORAL_CAPSULE | Freq: Two times a day (BID) | ORAL | Status: DC

## 2015-08-10 NOTE — Assessment & Plan Note (Addendum)
Patient's symptoms consistent with influenza and she had a positive rapid flu in the office today. Given her history of diabetes we will proceed with treatment with Tamiflu. Discussed staying well-hydrated. Tylenol for discomfort. She has cough syrup at home that she will use. She notes she had itching with the previously that was never had an anaphylactic-like reaction to any anti-tussive medications. Discussed that she should monitor she has recurrence of itching she should let us know. Of note patient was accompanied by a friend that has spent the majority of the last 5 days with her and is additionally a patient in the clinic, her name is Chief of Staff. I discussed potential prophylaxis with Tamiflu with my patient and Caren Griffins and reviewed Cynthia's chart as well and Caren Griffins opted to proceed with prophylactic Tamiflu with a paper prescription provided to [place]. Caren Griffins was advised that if she develops symptoms she should seek medical attention. My patient was given return precautions.

## 2015-08-10 NOTE — Patient Instructions (Signed)
Nice to see you. You have the flu. We will treat you with Tamiflu. You should stay well hydrated by drinking plenty of fluids. You can take Tylenol for any discomfort. Dry the cough syrup you have at home. If you have itching with a please let us know. If you develop chest pain, shortness of breath, cough productive of bloody, persistent fevers, or any new or changing symptoms please seek medical attention.

## 2015-08-10 NOTE — Patient Instructions (Addendum)
Please go to your primary care and get swabbed for the flu TODAY!  You have been scheduled for an endoscopy and colonoscopy on 09/16/15, Wednesday at 2:30 pm.   You have been scheduled for a previsit to get instructions for procedure on Wednesday, 09/02/15 @ 9 am.  We have sent the following medications to your pharmacy for you to pick up at your convenience: Zofran 4 mg every 6 hours as needed  Continue your Protonix.  Your physician has requested that you go to the basement for the following lab work before leaving today: Celiac, TSH  You have been scheduled for an abdominal ultrasound at [facility] (1st floor of hospital) on 08/21/15 at 8:00 am. Please arrive 15 minutes prior to your appointment for registration. Make certain not to have anything to eat or drink 6 hours prior to your appointment. Should you need to reschedule your appointment, please contact radiology at [phone]. This test typically takes about 30 minutes to perform.

## 2015-08-10 NOTE — Progress Notes (Signed)
Patient ID: Nicole Bryant, female   DOB: [birth year], 45 y.o.   MRN: [id number] HPI: Nicole Bryant is a 45 year old female with a past medical history of diabetes, GERD, hyperlipidemia and Raynaud's disease who is seen in consultation at the request of Dr. Caryl Bis to evaluate abdominal pain and heme positive stool. She reports that she is had 6 weeks of epigastric and mid abdominal pain as well as tightness in her chest. This is worse with eating. She's had early satiety and nausea. She's been eating dramatically less amount of food and has lost 10 pounds. She's had long-standing reflux disease and was on omeprazole 40 mg daily which is been changed to pantoprazole 40 mg twice daily. She feels some breakthrough heartburn despite the new twice daily pantoprazole. About a month ago she was taking Aleve daily for Raynaud's pain but stopped on advice from primary care. She took 3 weeks a Carafate which helped slightly but has been off a week. She has not had vomiting. Her bowel movements were previously okay though she had a week of constipation and started Colace. Colace seem to help but recently at 200 mg a day she's had loose stools. She denies black or tarry stools. She had red blood in her stool about a week ago. She is not taking medication for diabetes but been monitoring blood sugars. They seem to have been slightly better recently but his highest 300 in the last month. She has been trying to control her sugars with dietary modification. She is divorced and has one daughter. No alcohol or tobacco.  Separate from these issues last Wednesday she developed cough and sore throat. Over the weekend she's had fever as high as 102 Fahrenheit. Diffuse body aches and chills. No evaluation of these symptoms to date.  Primary care performed lab testing. She was also found to have heme positive stool. Liver enzyme was normal. H. pylori antigen negative. Hemoglobin normal. Lipase normal. White cell  count normal. Hemoglobin A1c elevated at 7.6. Triglycerides elevated at 320. Vitamin D normal. Urine pregnancy test negative  Past Medical History  Diagnosis Date  . Chickenpox   . Diabetes ([place])   . GERD (gastroesophageal reflux disease)   . Allergic rhinitis   . Parathyroid abnormality ([place])   . UTI (lower urinary tract infection)   . Diabetes ([place])   . Hyperlipemia   . Raynaud disease     Past Surgical History  Procedure Laterality Date  . Parathyroidectomy  2009  . Tubal ligation  2008  . Breast cyst excision      Outpatient Prescriptions Prior to Visit  Medication Sig Dispense Refill  . Blood Glucose Monitoring Suppl (RELION CONFIRM GLUCOSE MONITOR) w/Device KIT Check your blood glucose once daily before breakfast 1 kit 0  . cholecalciferol (VITAMIN D) 1000 units tablet Take 5,000 Units by mouth daily.    . fluticasone (FLONASE) 50 MCG/ACT nasal spray Place 2 sprays into both nostrils daily. 16 g 6  . glucose blood (RELION GLUCOSE TEST STRIPS) test strip Check your blood glucose once daily before breakfast 100 each 12  . loratadine (CLARITIN) 10 MG tablet Take 10 mg by mouth daily.    . pantoprazole (PROTONIX) 40 MG tablet Take 1 tablet (40 mg total) by mouth 2 (two) times daily before a meal. 60 tablet 3  . Pyridoxine HCl (VITAMIN B6) 200 MG TABS Take by mouth daily.    Marland Kitchen RELION LANCETS STANDARD 21G MISC Check your blood sugar once daily before breakfast. 200 each  0  . atorvastatin (LIPITOR) 40 MG tablet Take 1 tablet (40 mg total) by mouth daily. 90 tablet 3  . polyethylene glycol powder (GLYCOLAX/MIRALAX) powder Take 17 g by mouth daily. 3350 g 1  . sucralfate (CARAFATE) 1 g tablet Take 1 tablet (1 g total) by mouth 4 (four) times daily -  with meals and at bedtime. 30 tablet 0   No facility-administered medications prior to visit.    Allergies  Allergen Reactions  . Codeine Itching  . Sulfa Antibiotics     Family History  Problem Relation Age of Onset  . Stroke  Father   . Heart disease Maternal Grandfather   . Hypertension Paternal Aunt   . Diabetes Paternal Aunt   . Cancer Cousin     Peritoneal carcinoma  . Prostate cancer      great uncle  . Heart disease Paternal Grandmother   . Diverticulosis Mother   . GER disease Maternal Aunt     Social History  Substance Use Topics  . Smoking status: Never Smoker   . Smokeless tobacco: Never Used  . Alcohol Use: No    ROS: As per history of present illness, otherwise negative  BP 138/88 mmHg  Pulse 88  Ht _0  (1.626 m)  Wt 186 lb 4 oz (84.482 kg)  BMI 31.95 kg/m2  LMP 08/03/2015 (Within Weeks) Constitutional: Well-developed and well-nourished. No distress. HEENT: Normocephalic and atraumatic. Conjunctivae are normal.  No scleral icterus. Neck: Neck supple. Trachea midline. Cardiovascular: Normal rate, regular rhythm and intact distal pulses. No M/R/G Pulmonary/chest: Effort normal and breath sounds normal. No wheezing, rales or rhonchi. Abdominal: Soft, upper abdominal tenderness without rebound or guarding, nondistended. Bowel sounds active throughout.  Extremities: no clubbing, cyanosis, or edema Lymphadenopathy: No cervical adenopathy noted. Neurological: Alert and oriented to person place and time. Skin: Skin is warm and dry. No rashes noted. Psychiatric: Normal mood and affect. Behavior is normal.  RELEVANT LABS AND IMAGING: CBC    Component Value Date/Time   WBC 7.0 07/07/2015 1118   RBC 5.07 07/07/2015 1118   HGB 13.9 07/07/2015 1118   HCT 42.3 07/07/2015 1118   PLT 244.0 07/07/2015 1118   MCV 83.6 07/07/2015 1118   MCHC 32.9 07/07/2015 1118   RDW 14.5 07/07/2015 1118    CMP     Component Value Date/Time   NA 139 07/07/2015 1118   K 4.2 07/07/2015 1118   CL 101 07/07/2015 1118   CO2 29 07/07/2015 1118   GLUCOSE 195* 07/07/2015 1118   BUN 8 07/07/2015 1118   CREATININE 0.76 07/07/2015 1118   CALCIUM 9.6 07/07/2015 1118   PROT 7.4 07/07/2015 1118   ALBUMIN  4.5 07/07/2015 1118   AST 16 07/07/2015 1118   ALT 15 07/07/2015 1118   ALKPHOS 59 07/07/2015 1118   BILITOT 0.8 07/07/2015 1118    ASSESSMENT/PLAN: 45 year old female with a past medical history of diabetes, GERD, hyperlipidemia and Raynaud's disease who is seen in consultation at the request of Dr. Caryl Bis to evaluate abdominal pain and heme positive stool.  1. Epigastric pain/heme + stools/GERD/change in bowel habits -- I have recommended that she continue pantoprazole 40 mg twice a day for now. I'm going to give Zofran 4 mg every 6-8 hours as needed for nausea. Upper endoscopy and colonoscopy recommended to evaluate abdominal pain, heme positive stool and history of reflux disease. I have clinical suspicion for gastroparesis in the setting of hyperglycemia and diabetes. Gastroparesis could explain early satiety, nausea, abdominal pain.  Rule out ulcer disease, rule out H. pylori though she did have a negative H. pylori antigen (however she was on PPI which can lower sensitivity).  I've also recommended celiac panel and TSH. Abdominal ultrasound rule out gallstones  2. Fever, cough, myalgias -- flu swab not available at our lab. I recommend that she contact PCP today for flu swab to rule out influenza. Hand hygiene and mask when in public recommended.     ZB:CAFQ Carolan Shiver, Md [address] [place], [place] [address]

## 2015-08-10 NOTE — Progress Notes (Signed)
Patient ID: Nicole Bryant, female   DOB: May 16, 1971, 45 y.o.   MRN: [id number]  [name], MD Phone: [phone]  Nicole Bryant is a 45 y.o. female who presents today for same-day visit.  Patient notes onset of symptoms last Wednesday. Started with mild cough. Developed some body aches and fevers on Thursday. Highest temperature was 102F on Saturday. Has been in bed pretty much until today. Cough is nonproductive. Some rhinorrhea. No earache. No shortness of breath. No chest pain. She just does not feel like she is quite getting better. She has a history of diabetes.  PMH: nonsmoker    ROS see history of present illness  Objective  Physical Exam Filed Vitals:   08/10/15 1456  BP: 136/88  Pulse: 97  Temp: 99 F (37.2 C)    BP Readings from Last 3 Encounters:  08/10/15 136/88  08/10/15 138/88  08/03/15 124/82   Wt Readings from Last 3 Encounters:  08/10/15 187 lb (84.823 kg)  08/10/15 186 lb 4 oz (84.482 kg)  08/03/15 191 lb 3.2 oz (86.728 kg)    Physical Exam  Constitutional: She is well-developed, well-nourished, and in no distress.  HENT:  Head: Normocephalic and atraumatic.  Right Ear: External ear normal.  Left Ear: External ear normal.  Mouth/Throat: No oropharyngeal exudate.  Mild posterior oropharyngeal erythema  Eyes: Conjunctivae are normal. Pupils are equal, round, and reactive to light.  Neck: Neck supple.  Cardiovascular: Normal rate, regular rhythm and normal heart sounds.  Exam reveals no gallop and no friction rub.   No murmur heard. Pulmonary/Chest: Effort normal and breath sounds normal. No respiratory distress. She has no wheezes. She has no rales.  Lymphadenopathy:    She has no cervical adenopathy.  Neurological: She is alert. Gait normal.  Skin: Skin is warm and dry. She is not diaphoretic.     Assessment/Plan: Please see individual problem list.  Influenza with respiratory manifestation Patient's symptoms  consistent with influenza and she had a positive rapid flu in the office today. Given her history of diabetes we will proceed with treatment with Tamiflu. Discussed staying well-hydrated. Tylenol for discomfort. She has cough syrup at home that she will use. She notes she had itching with the previously that was never had an anaphylactic-like reaction to any anti-tussive medications. Discussed that she should monitor she has recurrence of itching she should let us know. Of note patient was accompanied by a friend that has spent the majority of the last 5 days with her and is additionally a patient in the clinic, her name is Chief of Staff. I discussed potential prophylaxis with Tamiflu with my patient and Caren Griffins and reviewed Cynthia's chart as well and Caren Griffins opted to proceed with prophylactic Tamiflu with a paper prescription provided to [place]. Caren Griffins was advised that if she develops symptoms she should seek medical attention. My patient was given return precautions.    Orders Placed This Encounter  Procedures  . POCT Influenza A/B    Meds ordered this encounter  Medications  . DISCONTD: oseltamivir (TAMIFLU) 75 MG capsule    Sig: Take 1 capsule (75 mg total) by mouth 2 (two) times daily.    Dispense:  10 capsule    Refill:  0  . oseltamivir (TAMIFLU) 75 MG capsule    Sig: Take 1 capsule (75 mg total) by mouth 2 (two) times daily.    Dispense:  10 capsule    Refill:  0   [name], MD [place]  [facility]

## 2015-08-10 NOTE — Progress Notes (Signed)
Pre visit review using our clinic review tool, if applicable. No additional management support is needed unless otherwise documented below in the visit note. 

## 2015-08-11 LAB — TISSUE TRANSGLUTAMINASE, IGA: TISSUE TRANSGLUTAMINASE AB, IGA: 1 U/mL (ref <4)

## 2015-08-12 ENCOUNTER — Encounter: Payer: Self-pay | Admitting: Family Medicine

## 2015-08-13 ENCOUNTER — Telehealth: Payer: Self-pay | Admitting: Family Medicine

## 2015-08-13 NOTE — Telephone Encounter (Signed)
Pt is coming in tomorrow 08/14/15 at 10:30. Please advise, thanks

## 2015-08-13 NOTE — Telephone Encounter (Signed)
Can you call the patient and let her know she should be reevaluated based on the mychart message that she sent. I would recommend reevaluation today if she has continued to have the symptoms she described in her message.   Dr. Caryl Bis,         I am still having tightness in my chest and coughed up green stuff today and still have a fever. Not sure if this is part of the flu virus. Also, I am still not feeling well enough to return to work so can you change my doctor's note to return to work on Monday.        Thanks,    Eben Burow

## 2015-08-13 NOTE — Telephone Encounter (Signed)
LM for patient to return call. Please transfer to [facility]

## 2015-08-13 NOTE — Telephone Encounter (Signed)
Spoke with patient and she is have chest tightness when she is laying down. She is not having any trouble breathing or shortness of breath. I advised her that she should be seen today since she was having the tightness in the chest at a walkin clinic since we do not have any appointments available. Patient stated that [facility] scheduled her for 08/14/15 @ 10:30 AM and she would rather see Dr. Caryl Bis. I advised her that if her symptoms got worse or if she started having chest pain, SOB, worse tightness in the chest then she should be seen immediately.

## 2015-08-13 NOTE — Telephone Encounter (Signed)
Noted and agree. 

## 2015-08-14 ENCOUNTER — Ambulatory Visit (INDEPENDENT_AMBULATORY_CARE_PROVIDER_SITE_OTHER): Payer: BLUE CROSS/BLUE SHIELD | Admitting: Family Medicine

## 2015-08-14 ENCOUNTER — Encounter: Payer: Self-pay | Admitting: Family Medicine

## 2015-08-14 VITALS — BP 124/86 | HR 79 | Temp 98.3°F | Ht 62.5 in | Wt 188.4 lb

## 2015-08-14 DIAGNOSIS — R059 Cough, unspecified: Secondary | ICD-10-CM

## 2015-08-14 DIAGNOSIS — J111 Influenza due to unidentified influenza virus with other respiratory manifestations: Secondary | ICD-10-CM | POA: Diagnosis not present

## 2015-08-14 DIAGNOSIS — R05 Cough: Secondary | ICD-10-CM | POA: Diagnosis not present

## 2015-08-14 MED ORDER — ALBUTEROL SULFATE HFA 108 (90 BASE) MCG/ACT IN AERS: 2.0000 | INHALATION_SPRAY | Freq: Four times a day (QID) | RESPIRATORY_TRACT | Status: DC | PRN

## 2015-08-14 MED ORDER — ALBUTEROL SULFATE (2.5 MG/3ML) 0.083% IN NEBU
2.5000 mg | INHALATION_SOLUTION | Freq: Once | RESPIRATORY_TRACT | Status: AC
  Administered 2015-08-14: 2.5 mg via RESPIRATORY_TRACT

## 2015-08-14 MED ORDER — BENZONATATE 200 MG PO CAPS: 200.0000 mg | ORAL_CAPSULE | Freq: Two times a day (BID) | ORAL | Status: DC | PRN

## 2015-08-14 NOTE — Patient Instructions (Signed)
Nice to see you. Your symptoms are likely residual from the flu. We will treat you with albuterol inhaler to use as needed. We'll also send in [place] for you to take for cough. If you develop chest pain, shortness of breath, fevers, cough productive of blood, or any new or changing symptoms please seek medical attention.

## 2015-08-14 NOTE — Progress Notes (Signed)
Patient ID: Nicole Bryant, female   DOB: [birth year], 45 y.o.   MRN: [id number]  [name], MD Phone: [phone]  Nicole Bryant is a 45 y.o. female who presents today for follow-up.  Patient was seen earlier this week with flu symptoms and diagnosed with influenza with positive rapid flu test. She notes overall she is improving though she does note some mild tenured productive cough with some minimal tightness in her chest when she lays down and coughs. Minimal shortness of breath with this. No wheezing. Notes the fevers gone. None of her shortness of breath or tightness is exertional. There is no associated diaphoresis. She has no history of cardiac issues. No history of VTE. No recent surgeries or hospitalizations.  PMH: nonsmoker.   ROS see history of present illness  Objective  Physical Exam Filed Vitals:   08/14/15 1027  BP: 124/86  Pulse: 79  Temp: 98.3 F (36.8 C)    BP Readings from Last 3 Encounters:  08/14/15 124/86  08/10/15 136/88  08/10/15 138/88   Wt Readings from Last 3 Encounters:  08/14/15 188 lb 6.4 oz (85.458 kg)  08/10/15 187 lb (84.823 kg)  08/10/15 186 lb 4 oz (84.482 kg)    Physical Exam  Constitutional: She is well-developed, well-nourished, and in no distress.  HENT:  Head: Normocephalic and atraumatic.  Right Ear: External ear normal.  Left Ear: External ear normal.  Mouth/Throat: Oropharynx is clear and moist. No oropharyngeal exudate.  Eyes: Conjunctivae are normal. Pupils are equal, round, and reactive to light.  Neck: Neck supple.  Cardiovascular: Normal rate, regular rhythm and normal heart sounds.  Exam reveals no gallop and no friction rub.   No murmur heard. Pulmonary/Chest: Effort normal and breath sounds normal. No respiratory distress. She has no wheezes. She has no rales.  Musculoskeletal: She exhibits no edema.  Lymphadenopathy:    She has no cervical adenopathy.  Neurological: She is alert. Gait  normal.  Skin: Skin is warm and dry. She is not diaphoretic.     Assessment/Plan: Please see individual problem list.  Influenza with respiratory manifestation Patient presents in follow-up for influenza. Notes she is feeling better. Does have continued cough and some tightness that is likely consistent with reactive airway. Tightness is atypical and not consistent with cardiac cause. Unlikely VTE given stable vital signs and lack of history. Lung exam is benign. She was given a nebulizer treatment to see if this would help with the reactive component. She reported improvement in her breathing with this treatment. We will treat her with an albuterol inhaler at home. She will start Tessalon for cough. She'll continue to monitor. She's given return cautions.     No orders of the defined types were placed in this encounter.    Meds ordered this encounter  Medications  . albuterol (PROVENTIL HFA;VENTOLIN HFA) 108 (90 Base) MCG/ACT inhaler    Sig: Inhale 2 puffs into the lungs every 6 (six) hours as needed for wheezing or shortness of breath.    Dispense:  1 Inhaler    Refill:  0  . benzonatate (TESSALON) 200 MG capsule    Sig: Take 1 capsule (200 mg total) by mouth 2 (two) times daily as needed for cough.    Dispense:  20 capsule    Refill:  0  . albuterol (PROVENTIL) (2.5 MG/3ML) 0.083% nebulizer solution 2.5 mg    Sig:    [name], MD [place]

## 2015-08-14 NOTE — Progress Notes (Signed)
Pre visit review using our clinic review tool, if applicable. No additional management support is needed unless otherwise documented below in the visit note. 

## 2015-08-14 NOTE — Assessment & Plan Note (Addendum)
Patient presents in follow-up for influenza. Notes she is feeling better. Does have continued cough and some tightness that is likely consistent with reactive airway. Tightness is atypical and not consistent with cardiac cause. Unlikely VTE given stable vital signs and lack of history. Lung exam is benign. She was given a nebulizer treatment to see if this would help with the reactive component. She reported improvement in her breathing with this treatment. We will treat her with an albuterol inhaler at home. She will start Tessalon for cough. She'll continue to monitor. She's given return cautions.

## 2015-08-21 ENCOUNTER — Ambulatory Visit (HOSPITAL_COMMUNITY): Payer: BLUE CROSS/BLUE SHIELD

## 2015-09-08 ENCOUNTER — Emergency Department (HOSPITAL_COMMUNITY): Payer: BLUE CROSS/BLUE SHIELD

## 2015-09-08 ENCOUNTER — Observation Stay (HOSPITAL_COMMUNITY)
Admission: EM | Admit: 2015-09-08 | Discharge: 2015-09-09 | Disposition: A | Discharge status: Home / Self Care | Payer: BLUE CROSS/BLUE SHIELD | Attending: Internal Medicine | Admitting: Internal Medicine

## 2015-09-08 ENCOUNTER — Encounter (HOSPITAL_COMMUNITY): Payer: Self-pay | Admitting: Family Medicine

## 2015-09-08 DIAGNOSIS — K760 Fatty (change of) liver, not elsewhere classified: Secondary | ICD-10-CM | POA: Diagnosis not present

## 2015-09-08 DIAGNOSIS — R112 Nausea with vomiting, unspecified: Secondary | ICD-10-CM | POA: Diagnosis present

## 2015-09-08 DIAGNOSIS — E860 Dehydration: Secondary | ICD-10-CM | POA: Diagnosis present

## 2015-09-08 DIAGNOSIS — K5090 Crohn's disease, unspecified, without complications: Secondary | ICD-10-CM | POA: Diagnosis present

## 2015-09-08 DIAGNOSIS — R6881 Early satiety: Secondary | ICD-10-CM | POA: Insufficient documentation

## 2015-09-08 DIAGNOSIS — K219 Gastro-esophageal reflux disease without esophagitis: Secondary | ICD-10-CM | POA: Diagnosis not present

## 2015-09-08 DIAGNOSIS — R109 Unspecified abdominal pain: Secondary | ICD-10-CM | POA: Diagnosis not present

## 2015-09-08 DIAGNOSIS — E872 Acidosis, unspecified: Secondary | ICD-10-CM | POA: Insufficient documentation

## 2015-09-08 DIAGNOSIS — R1011 Right upper quadrant pain: Secondary | ICD-10-CM | POA: Diagnosis not present

## 2015-09-08 DIAGNOSIS — R55 Syncope and collapse: Secondary | ICD-10-CM | POA: Insufficient documentation

## 2015-09-08 DIAGNOSIS — R1013 Epigastric pain: Secondary | ICD-10-CM | POA: Diagnosis present

## 2015-09-08 DIAGNOSIS — E119 Type 2 diabetes mellitus without complications: Secondary | ICD-10-CM | POA: Insufficient documentation

## 2015-09-08 DIAGNOSIS — K529 Noninfective gastroenteritis and colitis, unspecified: Secondary | ICD-10-CM | POA: Insufficient documentation

## 2015-09-08 DIAGNOSIS — E785 Hyperlipidemia, unspecified: Secondary | ICD-10-CM | POA: Diagnosis present

## 2015-09-08 DIAGNOSIS — Z794 Long term (current) use of insulin: Secondary | ICD-10-CM | POA: Insufficient documentation

## 2015-09-08 DIAGNOSIS — R195 Other fecal abnormalities: Secondary | ICD-10-CM | POA: Diagnosis present

## 2015-09-08 DIAGNOSIS — R197 Diarrhea, unspecified: Secondary | ICD-10-CM | POA: Diagnosis not present

## 2015-09-08 DIAGNOSIS — E1165 Type 2 diabetes mellitus with hyperglycemia: Secondary | ICD-10-CM | POA: Diagnosis present

## 2015-09-08 DIAGNOSIS — A0819 Acute gastroenteropathy due to other small round viruses: Secondary | ICD-10-CM | POA: Diagnosis not present

## 2015-09-08 LAB — URINALYSIS, ROUTINE W REFLEX MICROSCOPIC
Bilirubin Urine: NEGATIVE
Glucose, UA: NEGATIVE mg/dL
Ketones, ur: 15 mg/dL — AB
Nitrite: NEGATIVE
Protein, ur: 100 mg/dL — AB
SPECIFIC GRAVITY, URINE: 1.013 (ref 1.005–1.030)
pH: 6.5 (ref 5.0–8.0)

## 2015-09-08 LAB — COMPREHENSIVE METABOLIC PANEL
ALT: 20 U/L (ref 14–54)
ANION GAP: 18 — AB (ref 5–15)
AST: 29 U/L (ref 15–41)
Albumin: 4.5 g/dL (ref 3.5–5.0)
Alkaline Phosphatase: 53 U/L (ref 38–126)
BUN: 6 mg/dL (ref 6–20)
CALCIUM: 9.7 mg/dL (ref 8.9–10.3)
CHLORIDE: 104 mmol/L (ref 101–111)
CO2: 16 mmol/L — AB (ref 22–32)
Creatinine, Ser: 1.02 mg/dL — ABNORMAL HIGH (ref 0.44–1.00)
GFR calc non Af Amer: >60 mL/min (ref >60)
Glucose, Bld: 154 mg/dL — ABNORMAL HIGH (ref 65–99)
POTASSIUM: 3.7 mmol/L (ref 3.5–5.1)
SODIUM: 138 mmol/L (ref 135–145)
Total Bilirubin: 1.4 mg/dL — ABNORMAL HIGH (ref 0.3–1.2)
Total Protein: 8 g/dL (ref 6.5–8.1)

## 2015-09-08 LAB — URINE MICROSCOPIC-ADD ON

## 2015-09-08 LAB — I-STAT BETA HCG BLOOD, ED (MC, WL, AP ONLY)

## 2015-09-08 LAB — PROTIME-INR
INR: 1.2 (ref 0.00–1.49)
Prothrombin Time: 15.3 seconds — ABNORMAL HIGH (ref 11.6–15.2)

## 2015-09-08 LAB — APTT: aPTT: 27 seconds (ref 24–37)

## 2015-09-08 LAB — POC OCCULT BLOOD, ED: FECAL OCCULT BLD: POSITIVE — AB

## 2015-09-08 LAB — CBC
HCT: 43.9 % (ref 36.0–46.0)
HEMOGLOBIN: 13.6 g/dL (ref 12.0–15.0)
MCH: 27 pg (ref 26.0–34.0)
MCHC: 31 g/dL (ref 30.0–36.0)
MCV: 87.3 fL (ref 78.0–100.0)
Platelets: 268 10*3/uL (ref 150–400)
RBC: 5.03 MIL/uL (ref 3.87–5.11)
RDW: 14.7 % (ref 11.5–15.5)
WBC: 13.2 10*3/uL — ABNORMAL HIGH (ref 4.0–10.5)

## 2015-09-08 LAB — LIPASE, BLOOD: LIPASE: 29 U/L (ref 11–51)

## 2015-09-08 LAB — I-STAT CG4 LACTIC ACID, ED
Lactic Acid, Venous: 3.29 mmol/L (ref 0.5–2.0)
Lactic Acid, Venous: 1.99 mmol/L (ref 0.5–2.0)

## 2015-09-08 MED ORDER — PROCHLORPERAZINE EDISYLATE 5 MG/ML IJ SOLN
10.0000 mg | INTRAMUSCULAR | Status: DC | PRN
  Administered 2015-09-09: 10 mg via INTRAVENOUS
  Filled 2015-09-08: qty 2

## 2015-09-08 MED ORDER — SODIUM CHLORIDE 0.9 % IV BOLUS (SEPSIS): 1000.0000 mL | Freq: Once | INTRAVENOUS | Status: DC

## 2015-09-08 MED ORDER — ACETAMINOPHEN 650 MG RE SUPP: 650.0000 mg | Freq: Four times a day (QID) | RECTAL | Status: DC | PRN

## 2015-09-08 MED ORDER — ONDANSETRON HCL 4 MG/2ML IJ SOLN
4.0000 mg | Freq: Once | INTRAMUSCULAR | Status: AC
  Administered 2015-09-08: 4 mg via INTRAVENOUS
  Filled 2015-09-08: qty 2

## 2015-09-08 MED ORDER — IOPAMIDOL (ISOVUE-300) INJECTION 61%
INTRAVENOUS | Status: AC
  Administered 2015-09-08: 100 mL
  Filled 2015-09-08: qty 100

## 2015-09-08 MED ORDER — CIPROFLOXACIN IN D5W 400 MG/200ML IV SOLN
400.0000 mg | Freq: Two times a day (BID) | INTRAVENOUS | Status: DC
  Administered 2015-09-09: 400 mg via INTRAVENOUS
  Filled 2015-09-08: qty 200

## 2015-09-08 MED ORDER — METRONIDAZOLE IN NACL 5-0.79 MG/ML-% IV SOLN
500.0000 mg | Freq: Three times a day (TID) | INTRAVENOUS | Status: DC
  Administered 2015-09-09 (×2): 500 mg via INTRAVENOUS
  Filled 2015-09-08 (×2): qty 100

## 2015-09-08 MED ORDER — ENOXAPARIN SODIUM 40 MG/0.4ML ~~LOC~~ SOLN
40.0000 mg | SUBCUTANEOUS | Status: DC
  Administered 2015-09-09: 40 mg via SUBCUTANEOUS
  Filled 2015-09-08: qty 0.4

## 2015-09-08 MED ORDER — SODIUM CHLORIDE 0.9 % IV SOLN
Freq: Once | INTRAVENOUS | Status: AC
  Administered 2015-09-08: 19:00:00 via INTRAVENOUS

## 2015-09-08 MED ORDER — INSULIN ASPART 100 UNIT/ML ~~LOC~~ SOLN
0.0000 [IU] | Freq: Three times a day (TID) | SUBCUTANEOUS | Status: DC
  Administered 2015-09-09: 2 [IU] via SUBCUTANEOUS

## 2015-09-08 MED ORDER — SODIUM CHLORIDE 0.9 % IV BOLUS (SEPSIS)
1000.0000 mL | Freq: Once | INTRAVENOUS | Status: AC
  Administered 2015-09-08: 1000 mL via INTRAVENOUS

## 2015-09-08 MED ORDER — FENTANYL CITRATE (PF) 100 MCG/2ML IJ SOLN: 25.0000 ug | INTRAMUSCULAR | Status: DC | PRN

## 2015-09-08 MED ORDER — HYDRALAZINE HCL 20 MG/ML IJ SOLN: 10.0000 mg | INTRAMUSCULAR | Status: DC | PRN

## 2015-09-08 MED ORDER — FENTANYL CITRATE (PF) 100 MCG/2ML IJ SOLN
50.0000 ug | Freq: Once | INTRAMUSCULAR | Status: AC
  Administered 2015-09-08: 50 ug via INTRAVENOUS
  Filled 2015-09-08: qty 2

## 2015-09-08 MED ORDER — ALBUTEROL SULFATE (2.5 MG/3ML) 0.083% IN NEBU: 3.0000 mL | INHALATION_SOLUTION | Freq: Four times a day (QID) | RESPIRATORY_TRACT | Status: DC | PRN

## 2015-09-08 MED ORDER — LORATADINE 10 MG PO TABS
10.0000 mg | ORAL_TABLET | Freq: Every day | ORAL | Status: DC
  Administered 2015-09-09: 10 mg via ORAL
  Filled 2015-09-08: qty 1

## 2015-09-08 MED ORDER — SODIUM CHLORIDE 0.9 % IV BOLUS (SEPSIS)
500.0000 mL | Freq: Once | INTRAVENOUS | Status: AC
  Administered 2015-09-08: 500 mL via INTRAVENOUS

## 2015-09-08 MED ORDER — FLUTICASONE PROPIONATE 50 MCG/ACT NA SUSP
2.0000 | Freq: Every day | NASAL | Status: DC
  Administered 2015-09-09: 2 via NASAL
  Filled 2015-09-08: qty 16

## 2015-09-08 MED ORDER — ACETAMINOPHEN 325 MG PO TABS
650.0000 mg | ORAL_TABLET | Freq: Four times a day (QID) | ORAL | Status: DC | PRN
  Administered 2015-09-09: 650 mg via ORAL
  Filled 2015-09-08: qty 2

## 2015-09-08 MED ORDER — PANTOPRAZOLE SODIUM 40 MG PO TBEC
40.0000 mg | DELAYED_RELEASE_TABLET | Freq: Two times a day (BID) | ORAL | Status: DC
  Administered 2015-09-09 (×2): 40 mg via ORAL
  Filled 2015-09-08 (×2): qty 1

## 2015-09-08 MED ORDER — PROCHLORPERAZINE EDISYLATE 5 MG/ML IJ SOLN
10.0000 mg | Freq: Once | INTRAMUSCULAR | Status: AC
  Administered 2015-09-08: 10 mg via INTRAVENOUS
  Filled 2015-09-08: qty 2

## 2015-09-08 MED ORDER — SODIUM CHLORIDE 0.9 % IV SOLN
INTRAVENOUS | Status: AC
  Administered 2015-09-09: via INTRAVENOUS

## 2015-09-08 NOTE — ED Notes (Signed)
Pt here for abd pain, N,V,D. sts she is scheduled to have her gallbladder checked in the next week.

## 2015-09-08 NOTE — ED Notes (Signed)
Dr. Wyvonnia Dusky MD at bedside.

## 2015-09-08 NOTE — ED Notes (Signed)
Patient transported to CT 

## 2015-09-08 NOTE — ED Provider Notes (Signed)
CSN: [id number]     Arrival date & time 09/08/15  1539 History   First MD Initiated Contact with Patient 09/08/15 1637     Chief Complaint  Patient presents with  . Abdominal Pain     (Consider location/radiation/quality/duration/timing/severity/associated sxs/prior Treatment) HPI Comments: Patient from home with a two-month history of intermittent nausea vomiting diarrhea and abdominal pain. She states this is been waxing and waning for the past 2 months intermittently bloody diarrhea as well as vomiting. She was seen by gastroenterology and scheduled for an EGD, colonoscopy and gallbladder scan. She has not had this done yet. She is a diabetic but not had any medications. She reports today she's had 20 episodes of diarrhea over the past 24 hours has been nonbloody. 3 episodes of vomiting. Had a near syncopal episode in triage with vagal episode to blood pressure of 60. Denies any chest pain or shortness of breath. Complains of diffuse abdominal pain worse on the right side. Denies any recent antibiotic use, sick contacts or recent travel.  The history is provided by the patient.    Past Medical History  Diagnosis Date  . Chickenpox   . Diabetes ([place])   . GERD (gastroesophageal reflux disease)   . Allergic rhinitis   . Parathyroid abnormality ([place])   . UTI (lower urinary tract infection)   . Diabetes ([place])   . Hyperlipemia   . Raynaud disease    Past Surgical History  Procedure Laterality Date  . Parathyroidectomy  2009  . Tubal ligation  2008  . Breast cyst excision     Family History  Problem Relation Age of Onset  . Stroke Father   . Heart disease Maternal Grandfather   . Hypertension Paternal Aunt   . Diabetes Paternal Aunt   . Cancer Cousin     Peritoneal carcinoma  . Prostate cancer      great uncle  . Heart disease Paternal Grandmother   . Diverticulosis Mother   . GER disease Maternal Aunt    Social History  Substance Use Topics  . Smoking status: Never  Smoker   . Smokeless tobacco: Never Used  . Alcohol Use: No   OB History    No data available     Review of Systems  Constitutional: Positive for diaphoresis, activity change and appetite change. Negative for fever.  HENT: Negative for congestion and nosebleeds.   Eyes: Negative for visual disturbance.  Respiratory: Negative for cough and shortness of breath.   Cardiovascular: Negative for chest pain.  Gastrointestinal: Positive for nausea, vomiting, abdominal pain and diarrhea.  Genitourinary: Negative for dysuria, hematuria, vaginal bleeding and vaginal discharge.  Musculoskeletal: Positive for myalgias and arthralgias.  Skin: Negative for rash.  Neurological: Positive for weakness. Negative for dizziness, light-headedness and headaches.  A complete 10 system review of systems was obtained and all systems are negative except as noted in the HPI and PMH.      Allergies  Codeine and Sulfa antibiotics  Home Medications   Prior to Admission medications   Medication Sig Start Date End Date Taking? Authorizing Provider  albuterol (PROVENTIL HFA;VENTOLIN HFA) 108 (90 Base) MCG/ACT inhaler Inhale 2 puffs into the lungs every 6 (six) hours as needed for wheezing or shortness of breath. 08/14/15  Yes Leone Haven, MD  Blood Glucose Monitoring Suppl ([place]) w/Device KIT Check your blood glucose once daily before breakfast 08/03/15  Yes Leone Haven, MD  cholecalciferol (VITAMIN D) 1000 units tablet Take 5,000 Units  by mouth daily.   Yes Historical Provider, MD  fluticasone (FLONASE) 50 MCG/ACT nasal spray Place 2 sprays into both nostrils daily. 07/07/15  Yes Leone Haven, MD  glucose blood ([facility] GLUCOSE TEST STRIPS) test strip Check your blood glucose once daily before breakfast 08/03/15  Yes Leone Haven, MD  Lancet Devices ([place] LANCING DEV) [place]  08/03/15  Yes Historical Provider, MD  loratadine (CLARITIN) 10 MG tablet Take 10 mg  by mouth daily.   Yes Historical Provider, MD  pantoprazole (PROTONIX) 40 MG tablet Take 1 tablet (40 mg total) by mouth 2 (two) times daily before a meal. 08/03/15  Yes Leone Haven, MD  Pyridoxine HCl (VITAMIN B6) 200 MG TABS Take by mouth daily.   Yes Historical Provider, MD  RELION LANCETS STANDARD 21G MISC Check your blood sugar once daily before breakfast. 08/03/15  Yes Leone Haven, MD  atorvastatin (LIPITOR) 40 MG tablet Take 40 mg by mouth daily. Reported on 09/08/2015 08/03/15   Historical Provider, MD  benzonatate (TESSALON) 200 MG capsule Take 1 capsule (200 mg total) by mouth 2 (two) times daily as needed for cough. 08/14/15   Leone Haven, MD  oseltamivir (TAMIFLU) 75 MG capsule Take 1 capsule (75 mg total) by mouth 2 (two) times daily. 08/10/15   Leone Haven, MD   BP 175/98 mmHg  Pulse 117  Temp(Src) 100 F (37.8 C) (Oral)  Resp 20  SpO2 98%  LMP 08/03/2015 (Within Weeks) Physical Exam  Constitutional: She is oriented to person, place, and time. She appears well-developed and well-nourished. No distress.  Dry mucus membranes  HENT:  Head: Normocephalic and atraumatic.  Mouth/Throat: Oropharynx is clear and moist. No oropharyngeal exudate.  Eyes: Conjunctivae and EOM are normal. Pupils are equal, round, and reactive to light.  Neck: Normal range of motion. Neck supple.  No meningismus.  Cardiovascular: Normal rate, regular rhythm, normal heart sounds and intact distal pulses.   No murmur heard. Pulmonary/Chest: Effort normal and breath sounds normal. No respiratory distress.  Abdominal: Soft. There is tenderness. There is no rebound and no guarding.  R sided abdominal pain, upper >Lower.    Musculoskeletal: Normal range of motion. She exhibits no edema or tenderness.  Neurological: She is alert and oriented to person, place, and time. No cranial nerve deficit. She exhibits normal muscle tone. Coordination normal.  No ataxia on finger to nose bilaterally. No  pronator drift. 5/5 strength throughout. CN 2-12 intact.Equal grip strength. Sensation intact.   Skin: Skin is warm.  Psychiatric: She has a normal mood and affect. Her behavior is normal.  Nursing note and vitals reviewed.   ED Course  Procedures (including critical care time) Labs Review Labs Reviewed  COMPREHENSIVE METABOLIC PANEL - Abnormal; Notable for the following:    CO2 16 (*)    Glucose, Bld 154 (*)    Creatinine, Ser 1.02 (*)    Total Bilirubin 1.4 (*)    Anion gap 18 (*)    All other components within normal limits  CBC - Abnormal; Notable for the following:    WBC 13.2 (*)    All other components within normal limits  URINALYSIS, ROUTINE W REFLEX MICROSCOPIC (NOT AT [facility]) - Abnormal; Notable for the following:    APPearance CLOUDY (*)    Hgb urine dipstick TRACE (*)    Ketones, ur 15 (*)    Protein, ur 100 (*)    Leukocytes, UA SMALL (*)    All other  components within normal limits  URINE MICROSCOPIC-ADD ON - Abnormal; Notable for the following:    Squamous Epithelial / LPF 6-30 (*)    Bacteria, UA MANY (*)    Casts GRANULAR CAST (*)    All other components within normal limits  I-STAT CG4 LACTIC ACID, ED - Abnormal; Notable for the following:    Lactic Acid, Venous 3.29 (*)    All other components within normal limits  POC OCCULT BLOOD, ED - Abnormal; Notable for the following:    Fecal Occult Bld POSITIVE (*)    All other components within normal limits  URINE CULTURE  GASTROINTESTINAL PANEL BY PCR, STOOL (REPLACES STOOL CULTURE)  LIPASE, BLOOD  PROTIME-INR  APTT  BASIC METABOLIC PANEL  CBC WITH DIFFERENTIAL/PLATELET  LACTIC ACID, PLASMA  LACTIC ACID, PLASMA  HEMOGLOBIN A1C  PROCALCITONIN  I-STAT BETA HCG BLOOD, ED ([facility], [facility], [facility] ONLY)  I-STAT CG4 LACTIC ACID, ED    Imaging Review Ct Abdomen Pelvis W Contrast  09/08/2015  CLINICAL DATA:  Abdominal pain with nausea, vomiting and diarrhea. Mid abdominal pain since this morning. EXAM: CT ABDOMEN AND  PELVIS WITH CONTRAST TECHNIQUE: Multidetector CT imaging of the abdomen and pelvis was performed using the standard protocol following bolus administration of intravenous contrast. CONTRAST:  134m ISOVUE-300 IOPAMIDOL (ISOVUE-300) INJECTION 61% COMPARISON:  Ultrasound earlier this day. FINDINGS: Lower chest: The included lung bases are clear. Macroscopic calcifications within both breasts. Liver: Decreased density consistent with steatosis. No focal lesion. Hepatobiliary: Gallbladder physiologically distended, no calcified stone. No biliary dilatation. Pancreas: No ductal dilatation or inflammation. Spleen: Normal. Adrenal glands: No nodule. Kidneys: Symmetric renal enhancement and excretion. No hydronephrosis. Stomach/Bowel: Stomach physiologically distended. Large and small bowel fluid-filled with mild small bowel wall thickening. No mesenteric edema or perienteric inflammation. The large or small bowel dilatation. The appendix is normal. Vascular/Lymphatic: No retroperitoneal adenopathy. Abdominal aorta is normal in caliber. Reproductive: Uterus normal in size. Question of right fundal fibroid. Bilateral tubal ligation clips. Follicular cyst in the left ovary measures 2.5 cm, no dedicated imaging follow-up is needed. Bladder: Physiologically distended, no wall thickening. Other: No free air, free fluid, or intra-abdominal fluid collection. Musculoskeletal: There are no acute or suspicious osseous abnormalities. IMPRESSION: 1. Fluid-filled large and small bowel with mild small bowel wall thickening, enteritis pattern. No obstruction. 2. Otherwise no acute abnormality. Electronically Signed   By: MJeb LeveringM.D.   On: 09/08/2015 20:42   [place] Limited Ru[place]  09/08/2015  CLINICAL DATA:  Right upper quadrant pain for 2 months, worse today. Nausea, vomiting and diarrhea today. Initial encounter. EXAM: [place] - RIGHT UPPER QUADRANT COMPARISON:  None. FINDINGS: Gallbladder: No gallstones or wall  thickening visualized. No sonographic Murphy sign noted by sonographer. Common bile duct: Diameter: 0.6 cm Liver: Demonstrates some fatty infiltration with focal sparing along the gallbladder. No focal lesion or intrahepatic biliary ductal dilatation. IMPRESSION: Negative for gallstones.  No acute abnormality. Fatty infiltration of the liver. Electronically Signed   By: TInge RiseM.D.   On: 09/08/2015 18:02   I have personally reviewed and evaluated these images and lab results as part of my medical decision-making.   EKG Interpretation   Date/Time:  Tuesday September 08 2015 17:00:24 EDT Ventricular Rate:  99 PR Interval:  132 QRS Duration: 86 QT Interval:  340 QTC Calculation: 436 R Axis:   59 Text Interpretation:  Sinus rhythm Low voltage, precordial leads No  previous ECGs available Confirmed by RWyvonnia Dusky MD, Danisha Brassfield ([phone] on  09/08/2015 5:17:46 PM      MDM   Final diagnoses:  RUQ pain  Nausea vomiting and diarrhea  Metabolic acidosis  Worsening abdominal pain with diarrhea and nausea and vomiting. Scheduled to see GI for the same.  Dry mucous membranes orthostatic on arrival. IV fluids given. Right-sided abdominal tenderness. LFTs and lipase normal. Ultrasound negative for gallstones.   labs show elevated lactate, leukocytosis, and elevated anion gap.  Ultrasound is negative for gallstones. Patient is a have nausea despite Zofran, Phenergan. She is given IV Compazine and additional fluids.  CT scan shows enteritis without bowel obstruction.  Given her dehydration and ongoing nausea and vomiting, she'll be admitted for additional IV hydration into the control. Discussed with Dr. Myna Hidalgo.  Ezequiel Essex, MD 09/08/15 [phone]

## 2015-09-08 NOTE — H&P (Addendum)
History and Physical    Nicole ORTEZ FGB:[id number] DOB: Oct 01, 1970 DOA: 09/08/2015  Referring Provider: EDP PCP: Nicole Rumps, MD  Outpatient Specialists: Dr. Hilarie Bryant (GI)   Patient coming from: Home  Chief Complaint: Nausea, vomiting, diarrhea  HPI: Nicole Bryant is a 45 y.o. female with medical history significant for diet controlled type 2 diabetes, allergic rhinitis, and Raynaud disease who presents to the ED with nausea, vomiting, and diarrhea. Patient had been in her usual state of fairly good health until approximately 2 months ago when she developed nausea and early satiety with pain in the epigastrium and mid abdomen. This was associated with diarrhea, occasionally bloody, and vomiting. PCP sent the patient for GI consultation and EGD and colonoscopy are scheduled for May 3. Early today, the patient experienced acute worsening in her symptoms and reports approximately 25 episodes of vomiting and many episodes of diarrhea today alone. She has not noted any blood in her stool or emesis today. She denies fevers, but endorses chills. She denies any family history of inflammatory bowel disease. She has type 2 diabetes that is diet controlled and she denies peripheral neuropathy or other complication. There's been no recent travel or antibiotic use. She is postmenopausal.  ED Course: Upon arrival to the ED, patient is found to have temperature of 37.8 mL, is saturating well on room air, tachycardic in the 110s, and with vitals otherwise stable. EKG demonstrates sinus rhythm with low voltage QRS. CMP is notable for serum bicarbonate is 16 and anion gap of 18. CBC features a leukocytosis to 13,200. Lactic acid returns elevated to 3.29. DRE produces brown stool which is FOBT positive. Urine was obtained for analysis and features many bacteria, small leukocyte, negative nitrite, and 6-30 WBC. CT of the abdomen and pelvis was obtained and notable for fluid-filled large and small  bowel and mild small bowel thickening in a pattern consistent with enteritis. 2.5 L of normal saline were bolused and the patient was treated unsuccessfully with Zofran 2 and fentanyl. Some partial relief was achieved with Compazine. Patient has remained hemodynamically stable in the emergency department and will be admitted for ongoing evaluation and management of nausea, vomiting, and diarrhea with dehydration and suspected enteritis.  Review of Systems:  All other systems reviewed and apart from HPI, are negative.  Past Medical History  Diagnosis Date  . Chickenpox   . Diabetes ([place])   . GERD (gastroesophageal reflux disease)   . Allergic rhinitis   . Parathyroid abnormality ([place])   . UTI (lower urinary tract infection)   . Diabetes ([place])   . Hyperlipemia   . Raynaud disease     Past Surgical History  Procedure Laterality Date  . Parathyroidectomy  2009  . Tubal ligation  2008  . Breast cyst excision       reports that she has never smoked. She has never used smokeless tobacco. She reports that she does not drink alcohol or use illicit drugs.  Allergies  Allergen Reactions  . Codeine Itching  . Sulfa Antibiotics     Family History  Problem Relation Age of Onset  . Stroke Father   . Heart disease Maternal Grandfather   . Hypertension Paternal Aunt   . Diabetes Paternal Aunt   . Cancer Cousin     Peritoneal carcinoma  . Prostate cancer      great uncle  . Heart disease Paternal Grandmother   . Diverticulosis Mother   . GER disease Maternal Aunt  Prior to Admission medications   Medication Sig Start Date End Date Taking? Authorizing Provider  albuterol (PROVENTIL HFA;VENTOLIN HFA) 108 (90 Base) MCG/ACT inhaler Inhale 2 puffs into the lungs every 6 (six) hours as needed for wheezing or shortness of breath. 08/14/15  Yes Leone Haven, MD  Blood Glucose Monitoring Suppl ([place]) w/Device KIT Check your blood glucose once daily before  breakfast 08/03/15  Yes Leone Haven, MD  cholecalciferol (VITAMIN D) 1000 units tablet Take 5,000 Units by mouth daily.   Yes Historical Provider, MD  fluticasone (FLONASE) 50 MCG/ACT nasal spray Place 2 sprays into both nostrils daily. 07/07/15  Yes Leone Haven, MD  glucose blood (RELION GLUCOSE TEST STRIPS) test strip Check your blood glucose once daily before breakfast 08/03/15  Yes Leone Haven, MD  Lancet Devices ([place] LANCING DEV) [place]  08/03/15  Yes Historical Provider, MD  loratadine (CLARITIN) 10 MG tablet Take 10 mg by mouth daily.   Yes Historical Provider, MD  pantoprazole (PROTONIX) 40 MG tablet Take 1 tablet (40 mg total) by mouth 2 (two) times daily before a meal. 08/03/15  Yes Leone Haven, MD  Pyridoxine HCl (VITAMIN B6) 200 MG TABS Take by mouth daily.   Yes Historical Provider, MD  RELION LANCETS STANDARD 21G MISC Check your blood sugar once daily before breakfast. 08/03/15  Yes Leone Haven, MD  atorvastatin (LIPITOR) 40 MG tablet Take 40 mg by mouth daily. Reported on 09/08/2015 08/03/15   Historical Provider, MD  benzonatate (TESSALON) 200 MG capsule Take 1 capsule (200 mg total) by mouth 2 (two) times daily as needed for cough. 08/14/15   Leone Haven, MD  oseltamivir (TAMIFLU) 75 MG capsule Take 1 capsule (75 mg total) by mouth 2 (two) times daily. 08/10/15   Leone Haven, MD    Physical Exam: Filed Vitals:   09/08/15 2100 09/08/15 2130 09/08/15 2145 09/08/15 2213  BP: 134/92 144/102 167/109 175/98  Pulse: 103 109 119 117  Temp:    100 F (37.8 C)  TempSrc:    Oral  Resp: _0 SpO2: 100% 98% 97% 98%      Constitutional: NAD, calm, comfortable Eyes: PERTLA, lids and conjunctivae normal ENMT: Mucous membranes are dry. Posterior pharynx clear of any exudate or lesions.    Neck: normal, supple, no masses, no thyromegaly Respiratory: clear to auscultation bilaterally, no wheezing, no crackles. Normal respiratory effort.  No accessory muscle use.  Cardiovascular: Rate ~120 and regular, no murmurs / rubs / gallops. No extremity edema. 2+ pedal pulses. No carotid bruits.  Abdomen: No distension, mild tenderness throughout, no masses palpated. Bowel sounds normal.  Musculoskeletal: no clubbing / cyanosis. No joint deformity upper and lower extremities. Good ROM, no contractures. Normal muscle tone.  Skin: no rashes, lesions, ulcers. No induration Neurologic: CN 2-12 grossly intact. Sensation intact, DTR normal. Strength 5/5 in all 4 limbs.  Psychiatric: Normal judgment and insight. Alert and oriented x 3. Normal mood.     Labs on Admission: I have personally reviewed following labs and imaging studies  CBC:  Recent Labs Lab 09/08/15 1624  WBC 13.2*  HGB 13.6  HCT 43.9  MCV 87.3  PLT 144   Basic Metabolic Panel:  Recent Labs Lab 09/08/15 1624  NA 138  K 3.7  CL 104  CO2 16*  GLUCOSE 154*  BUN 6  CREATININE 1.02*  CALCIUM 9.7   GFR: CrCl cannot be calculated (Unknown  ideal weight.). Liver Function Tests:  Recent Labs Lab 09/08/15 1624  AST 29  ALT 20  ALKPHOS 53  BILITOT 1.4*  PROT 8.0  ALBUMIN 4.5    Recent Labs Lab 09/08/15 1624  LIPASE 29   No results for input(s): AMMONIA in the last 168 hours. Coagulation Profile: No results for input(s): INR, PROTIME in the last 168 hours. Cardiac Enzymes: No results for input(s): CKTOTAL, CKMB, CKMBINDEX, TROPONINI in the last 168 hours. BNP (last 3 results) No results for input(s): PROBNP in the last 8760 hours. HbA1C: No results for input(s): HGBA1C in the last 72 hours. CBG: No results for input(s): GLUCAP in the last 168 hours. Lipid Profile: No results for input(s): CHOL, HDL, LDLCALC, TRIG, CHOLHDL, LDLDIRECT in the last 72 hours. Thyroid Function Tests: No results for input(s): TSH, T4TOTAL, FREET4, T3FREE, THYROIDAB in the last 72 hours. Anemia Panel: No results for input(s): VITAMINB12, FOLATE, FERRITIN, TIBC, IRON,  RETICCTPCT in the last 72 hours. Urine analysis:    Component Value Date/Time   COLORURINE YELLOW 09/08/2015 1739   APPEARANCEUR CLOUDY* 09/08/2015 1739   LABSPEC 1.013 09/08/2015 1739   PHURINE 6.5 09/08/2015 1739   GLUCOSEU NEGATIVE 09/08/2015 1739   HGBUR TRACE* 09/08/2015 1739   BILIRUBINUR NEGATIVE 09/08/2015 1739   KETONESUR 15* 09/08/2015 1739   PROTEINUR 100* 09/08/2015 1739   UROBILINOGEN 0.2 08/24/2008 1023   NITRITE NEGATIVE 09/08/2015 1739   LEUKOCYTESUR SMALL* 09/08/2015 1739   Sepsis Labs: _0 (procalcitonin:4,lacticidven:4) )No results found for this or any previous visit (from the past 240 hour(s)).   Radiological Exams on Admission: Ct Abdomen Pelvis W Contrast  09/08/2015  CLINICAL DATA:  Abdominal pain with nausea, vomiting and diarrhea. Mid abdominal pain since this morning. EXAM: CT ABDOMEN AND PELVIS WITH CONTRAST TECHNIQUE: Multidetector CT imaging of the abdomen and pelvis was performed using the standard protocol following bolus administration of intravenous contrast. CONTRAST:  1106m ISOVUE-300 IOPAMIDOL (ISOVUE-300) INJECTION 61% COMPARISON:  Ultrasound earlier this day. FINDINGS: Lower chest: The included lung bases are clear. Macroscopic calcifications within both breasts. Liver: Decreased density consistent with steatosis. No focal lesion. Hepatobiliary: Gallbladder physiologically distended, no calcified stone. No biliary dilatation. Pancreas: No ductal dilatation or inflammation. Spleen: Normal. Adrenal glands: No nodule. Kidneys: Symmetric renal enhancement and excretion. No hydronephrosis. Stomach/Bowel: Stomach physiologically distended. Large and small bowel fluid-filled with mild small bowel wall thickening. No mesenteric edema or perienteric inflammation. The large or small bowel dilatation. The appendix is normal. Vascular/Lymphatic: No retroperitoneal adenopathy. Abdominal aorta is normal in caliber. Reproductive: Uterus normal in size. Question of  right fundal fibroid. Bilateral tubal ligation clips. Follicular cyst in the left ovary measures 2.5 cm, no dedicated imaging follow-up is needed. Bladder: Physiologically distended, no wall thickening. Other: No free air, free fluid, or intra-abdominal fluid collection. Musculoskeletal: There are no acute or suspicious osseous abnormalities. IMPRESSION: 1. Fluid-filled large and small bowel with mild small bowel wall thickening, enteritis pattern. No obstruction. 2. Otherwise no acute abnormality. Electronically Signed   By: MJeb LeveringM.D.   On: 09/08/2015 20:42   [place]  09/08/2015  CLINICAL DATA:  Right upper quadrant pain for 2 months, worse today. Nausea, vomiting and diarrhea today. Initial encounter. EXAM: [place] - RIGHT UPPER QUADRANT COMPARISON:  None. FINDINGS: Gallbladder: No gallstones or wall thickening visualized. No sonographic Murphy sign noted by sonographer. Common bile duct: Diameter: 0.6 cm Liver: Demonstrates some fatty infiltration with focal sparing along the gallbladder. No focal lesion or intrahepatic  biliary ductal dilatation. IMPRESSION: Negative for gallstones.  No acute abnormality. Fatty infiltration of the liver. Electronically Signed   By: Inge Rise M.D.   On: 09/08/2015 18:02    EKG: Independently reviewed. Sinus rhythm, low-voltage QRS  Assessment/Plan  1. Refractory nausea, vomiting, & diarrhea with dehydration  - Pt has had more subtle GI sxs for 2 months and is undergoing outpt GI workup currently  - Acute sxs suggestive of infectious enteritis, with Crohn's also on Ddx  - Significant dehydration on arrival with dry mucosa and lactic acid of 3.3 - 2.5 L NS given in ED, will continue with NS at 100 cc/hr overnight  - GI pathogen panel ordered - Blood and urine cultures incubating  - Trend lactate, check procalcitonin  - Infection-control with enteric precautions  - Treat empirically with Cipro and Flagyl   2. Type II  DM - Diet-controlled  - A1c 7.6% in February 2017  - Check CBG with meals and qHS  - Low-intensity sliding-scale insulin correctional  - Carb-modified diet when appropriate    DVT prophylaxis: sq Lovenox  Code Status: Full  Family Communication: Mother and father at bedside  Disposition Plan: Admit to med-surg  Consults called: None   Admission status: Observation   Vianne Bulls MD [facility] Pager [phone]  If 7PM-7AM, please contact night-coverage [email or web address] Password [facility]  09/08/2015, 10:46 PM

## 2015-09-09 DIAGNOSIS — E872 Acidosis, unspecified: Secondary | ICD-10-CM | POA: Insufficient documentation

## 2015-09-09 DIAGNOSIS — E119 Type 2 diabetes mellitus without complications: Secondary | ICD-10-CM

## 2015-09-09 DIAGNOSIS — R195 Other fecal abnormalities: Secondary | ICD-10-CM | POA: Diagnosis not present

## 2015-09-09 DIAGNOSIS — K529 Noninfective gastroenteritis and colitis, unspecified: Secondary | ICD-10-CM | POA: Insufficient documentation

## 2015-09-09 DIAGNOSIS — R1013 Epigastric pain: Secondary | ICD-10-CM

## 2015-09-09 DIAGNOSIS — E1165 Type 2 diabetes mellitus with hyperglycemia: Secondary | ICD-10-CM | POA: Diagnosis present

## 2015-09-09 DIAGNOSIS — E785 Hyperlipidemia, unspecified: Secondary | ICD-10-CM

## 2015-09-09 DIAGNOSIS — R197 Diarrhea, unspecified: Secondary | ICD-10-CM

## 2015-09-09 DIAGNOSIS — R112 Nausea with vomiting, unspecified: Secondary | ICD-10-CM | POA: Diagnosis not present

## 2015-09-09 LAB — CBC WITH DIFFERENTIAL/PLATELET
Basophils Absolute: 0 10*3/uL (ref 0.0–0.1)
Basophils Relative: 0 %
EOS PCT: 0 %
Eosinophils Absolute: 0 10*3/uL (ref 0.0–0.7)
HEMATOCRIT: 39.4 % (ref 36.0–46.0)
Hemoglobin: 12.4 g/dL (ref 12.0–15.0)
LYMPHS ABS: 0.3 10*3/uL — AB (ref 0.7–4.0)
LYMPHS PCT: 4 %
MCH: 27.1 pg (ref 26.0–34.0)
MCHC: 31.5 g/dL (ref 30.0–36.0)
MCV: 86.2 fL (ref 78.0–100.0)
MONO ABS: 0.4 10*3/uL (ref 0.1–1.0)
MONOS PCT: 5 %
NEUTROS ABS: 6.6 10*3/uL (ref 1.7–7.7)
Neutrophils Relative %: 91 %
PLATELETS: 163 10*3/uL (ref 150–400)
RBC: 4.57 MIL/uL (ref 3.87–5.11)
RDW: 14.6 % (ref 11.5–15.5)
WBC: 7.3 10*3/uL (ref 4.0–10.5)

## 2015-09-09 LAB — LACTIC ACID, PLASMA
LACTIC ACID, VENOUS: 3.1 mmol/L — AB (ref 0.5–2.0)
Lactic Acid, Venous: 2.1 mmol/L (ref 0.5–2.0)
Lactic Acid, Venous: 3.1 mmol/L (ref 0.5–2.0)

## 2015-09-09 LAB — BASIC METABOLIC PANEL
ANION GAP: 12 (ref 5–15)
CHLORIDE: 105 mmol/L (ref 101–111)
CO2: 20 mmol/L — AB (ref 22–32)
Calcium: 8.3 mg/dL — ABNORMAL LOW (ref 8.9–10.3)
Creatinine, Ser: 0.87 mg/dL (ref 0.44–1.00)
GFR calc Af Amer: >60 mL/min (ref >60)
GFR calc non Af Amer: >60 mL/min (ref >60)
GLUCOSE: 224 mg/dL — AB (ref 65–99)
POTASSIUM: 3.8 mmol/L (ref 3.5–5.1)
Sodium: 137 mmol/L (ref 135–145)

## 2015-09-09 LAB — GASTROINTESTINAL PANEL BY PCR, STOOL (REPLACES STOOL CULTURE)
Adenovirus F40/41: NOT DETECTED
Astrovirus: NOT DETECTED
CRYPTOSPORIDIUM: NOT DETECTED
Campylobacter species: NOT DETECTED
Cyclospora cayetanensis: NOT DETECTED
E. COLI O157: NOT DETECTED
ENTAMOEBA HISTOLYTICA: NOT DETECTED
ENTEROAGGREGATIVE E COLI (EAEC): NOT DETECTED
Enteropathogenic E coli (EPEC): NOT DETECTED
Enterotoxigenic E coli (ETEC): NOT DETECTED
GIARDIA LAMBLIA: NOT DETECTED
NOROVIRUS GI/GII: DETECTED — AB
PLESIMONAS SHIGELLOIDES: NOT DETECTED
Rotavirus A: NOT DETECTED
SALMONELLA SPECIES: NOT DETECTED
SAPOVIRUS (I, II, IV, AND V): NOT DETECTED
SHIGELLA/ENTEROINVASIVE E COLI (EIEC): NOT DETECTED
Shiga like toxin producing E coli (STEC): NOT DETECTED
VIBRIO CHOLERAE: NOT DETECTED
Vibrio species: NOT DETECTED
YERSINIA ENTEROCOLITICA: NOT DETECTED

## 2015-09-09 LAB — GLUCOSE, CAPILLARY
GLUCOSE-CAPILLARY: 187 mg/dL — AB (ref 65–99)
GLUCOSE-CAPILLARY: 156 mg/dL — AB (ref 65–99)

## 2015-09-09 LAB — PROCALCITONIN: PROCALCITONIN: 0.18 ng/mL

## 2015-09-09 MED ORDER — METRONIDAZOLE 500 MG PO TABS: 500.0000 mg | ORAL_TABLET | Freq: Three times a day (TID) | ORAL | Status: AC

## 2015-09-09 MED ORDER — SODIUM CHLORIDE 0.9 % IV BOLUS (SEPSIS)
500.0000 mL | Freq: Once | INTRAVENOUS | Status: AC
  Administered 2015-09-09: 500 mL via INTRAVENOUS

## 2015-09-09 MED ORDER — CIPROFLOXACIN HCL 500 MG PO TABS: 500.0000 mg | ORAL_TABLET | Freq: Two times a day (BID) | ORAL | Status: AC

## 2015-09-09 MED ORDER — SODIUM CHLORIDE 0.9 % IV SOLN
INTRAVENOUS | Status: DC
  Administered 2015-09-09: 1000 mL via INTRAVENOUS

## 2015-09-09 NOTE — Progress Notes (Signed)
Nsg Discharge Note  Admit Date:  09/08/2015 Discharge date: 09/09/2015   Nicole Bryant to be D/C'd Home per MD order.  AVS completed.  Copy for chart, and copy for patient signed, and dated. Patient/caregiver able to verbalize understanding.  Discharge Medication:   Medication List    STOP taking these medications        benzonatate 200 MG capsule  Commonly known as:  TESSALON     oseltamivir 75 MG capsule  Commonly known as:  TAMIFLU      TAKE these medications        albuterol 108 (90 Base) MCG/ACT inhaler  Commonly known as:  PROVENTIL HFA;VENTOLIN HFA  Inhale 2 puffs into the lungs every 6 (six) hours as needed for wheezing or shortness of breath.     atorvastatin 40 MG tablet  Commonly known as:  LIPITOR  Take 40 mg by mouth daily. Reported on 09/08/2015     cholecalciferol 1000 units tablet  Commonly known as:  VITAMIN D  Take 5,000 Units by mouth daily.     ciprofloxacin 500 MG tablet  Commonly known as:  CIPRO  Take 1 tablet (500 mg total) by mouth 2 (two) times daily.     fluticasone 50 MCG/ACT nasal spray  Commonly known as:  FLONASE  Place 2 sprays into both nostrils daily.     glucose blood test strip  Commonly known as:  RELION GLUCOSE TEST STRIPS  Check your blood glucose once daily before breakfast     loratadine 10 MG tablet  Commonly known as:  CLARITIN  Take 10 mg by mouth daily.     metroNIDAZOLE 500 MG tablet  Commonly known as:  FLAGYL  Take 1 tablet (500 mg total) by mouth 3 (three) times daily.     ONE TOUCH DELICA LANCING DEV Misc     pantoprazole 40 MG tablet  Commonly known as:  PROTONIX  Take 1 tablet (40 mg total) by mouth 2 (two) times daily before a meal.     RELION CONFIRM GLUCOSE MONITOR w/Device Kit  Check your blood glucose once daily before breakfast     RELION LANCETS STANDARD 21G Misc  Check your blood sugar once daily before breakfast.     Vitamin B6 200 MG Tabs  Take by mouth daily.         Discharge Assessment: Filed Vitals:   09/09/15 0310 09/09/15 0540  BP: 135/83 152/90  Pulse: 109 109  Temp: 99.8 F (37.7 C) 99.6 F (37.6 C)  Resp: 20 20   Skin clean, dry and intact without evidence of skin break down, no evidence of skin tears noted. IV catheter discontinued intact. Site without signs and symptoms of complications - no redness or edema noted at insertion site, patient denies c/o pain - only slight tenderness at site.  Dressing with slight pressure applied.  D/c Instructions-Education: Discharge instructions given to patient/family with verbalized understanding. D/c education completed with patient/family including follow up instructions, medication list, d/c activities limitations if indicated, with other d/c instructions as indicated by MD - patient able to verbalize understanding, all questions fully answered. Patient instructed to return to ED, call 911, or call MD for any changes in condition.  Patient escorted via [place], and D/C home via private auto.  Tylesha Gibeault Margaretha Sheffield, RN 09/09/2015 11:51 AM

## 2015-09-09 NOTE — Progress Notes (Signed)
[place]  Critical value received:  Lactic acid    Date of notification:  09/09/15  Time of notification:  12:05  Critical value read back:Yes.    Nurse who received alert:  Delcie Roch  MD notified (1st page):  Hospitalist  Time of first page:   12:15  MD notified (2nd page):  Time of second page:  Responding MD:  Dr. Alcario Drought  Time MD responded:  12:20

## 2015-09-09 NOTE — Progress Notes (Signed)
Received patient to floor via stretcher, alert and oriented, accompanied by parents.  Able to make needs known. Pt placed on enteric precautions due to N/V/D.

## 2015-09-09 NOTE — Progress Notes (Signed)
CAlled Dr. Myna Bright with positive Noro virus result. She is trying to contact patient.

## 2015-09-09 NOTE — Significant Event (Signed)
Night coverage note: Lactate trending up, increasing IVF to 125 cc/hr, giving patient 500 cc bolus as well.

## 2015-09-09 NOTE — Progress Notes (Signed)
CRITICAL VALUE ALERT  Critical value received:  Lactic Acid 3.1  Date of notification:  09/09/2015  Time of notification:  2:52  Critical value read back:Yes.    Nurse who received alert:  Job Founds, RN  MD notified (1st page):  MD, Alcario Drought  Time of first page:  2:54  Responding MD: Sheran Luz MD  Time MD responded:  2:58, Orders placed. 500cc bolus, Normal Saline at 136mL/hr

## 2015-09-09 NOTE — Discharge Summary (Addendum)
Discharge Summary  Patient Name: Nicole Bryant [id number] DOB: 04-21-1971  PCP: [name], MD  Admit date: 09/08/2015 Discharge date: 09/09/2015  Time spent: >30 minutes  Recommendations for Outpatient Follow-up:  1. Keep appointment with gastroenterology for outpatient colonoscopy and EGD. Follow up on GI pathogen panel.  2. Follow up with PCP, 2-3 weeks; Monitor CBC and BMP, especially for acidosis and renal dysfunction.   Discharge Diagnoses:  Active Hospital Problems   Diagnosis Date Noted  . 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 Guaiac positive stools 08/03/2015    Priority: High  . Non-insulin dependent type 2 diabetes mellitus ([place])   . Hyperlipidemia 08/03/2015    Resolved Hospital Problems   Diagnosis Date Noted Date Resolved  . Nausea vomiting and diarrhea 09/08/2015 09/09/2015  . Dehydration 09/08/2015 09/09/2015  . Abdominal pain, epigastric 07/07/2015 09/09/2015    Discharge Condition: Good   Diet recommendation: as tolerated  Filed Vitals:   09/09/15 0310 09/09/15 0540  BP: 135/83 152/90  Pulse: 109 109  Temp: 99.8 F (37.7 C) 99.6 F (37.6 C)  Resp: 20 20    History of present illness:  Patient is a 45 year old Caucasian female who presented for N/V and diarrhea. This has been ongoing for the past 2 months, no prior history. Diarrhea has mostly been watery, but occasional blood. Associated mid-epigastric abdominal pain and early satiety. She was actually set up with her PCP to see a gastroenterologist and has EGD and colonoscopy on 09/16/15. Presented with 25 episodes of vomiting and diarrhea on day of admission. Otherwise, no fevers but with chills.   Hospital Course:  Patient admitted as observation for supportive care and IVFs. Patient's lactate on venous draw was noted to be elevated. CT scan of the abd/pelv showed fluid-filled large and small bowel with mild small bowel wall thickening, enteritis pattern. No obstruction noted. She  was started empirically on cipro and flagyl. Patient with mild leukocytosis on admission which resolved by discharge. Stool was sent for GI pathogen panel and will need to be followed up. There was also mild renal insufficiency which resolved by the next day. Patient reported improvement in symptoms and tolerated clear liquids on the day of discharge. She wishes to return home and keep appointments for outpatient GI. She has her pre-op scheduled for tomorrow. I will prescribe cipro and flagyl empirically for now to complete a week's course. Advised inflammatory bowel disease showed definitely be ruled out. Hemoccult noted to be positive without obvious GI bleed. Needs to be followed up as well. Otherwise, the patient was stable, had reached maximum benefit of the hospital and will be discharged to home. I have asked that lactate be rechecked prior to discharge. Clinically patient looks stable for discharge. She had reported already having zofran for nausea at home.   **ADDENDUM: GI pathogen resulted in norovirus positivity. I have notified patient of this and avoiding sharing/preparing food until symptoms resolved. Advised although other pathogens negative, would continue antibiotics and follow up with GI. Unclear if there was still any underlying process as symptoms have been ongoing for 2 months. Acute worsening was likely due to norovirus though.   Procedures:  None  Consultations:  None  Discharge Exam: BP 152/90 mmHg  Pulse 109  Temp(Src) 99.6 F (37.6 C) (Oral)  Resp 20  Ht 5' 4" (1.626 m)  Wt 85.1 kg (187 lb 9.8 oz)  BMI 32.19 kg/m2  SpO2 97%  LMP 08/03/2015 (Within Weeks)  Physical Exam  Constitutional: She is  oriented to person, place, and time. No distress.  Cardiovascular: Normal rate, regular rhythm and normal heart sounds.   No murmur heard. Pulmonary/Chest: Effort normal and breath sounds normal. No respiratory distress. She has no wheezes.  Abdominal: Soft. She exhibits no  distension. Bowel sounds are hyperactive. There is no tenderness.  Musculoskeletal: She exhibits no edema or tenderness.  Neurological: She is alert and oriented to person, place, and time. GCS score is 15.  Skin: Skin is warm and dry. She is not diaphoretic. No erythema.     Discharge Instructions You were cared for by a hospitalist during your hospital stay. If you have any questions about your discharge medications or the care you received while you were in the hospital after you are discharged, you can call the unit and asked to speak with the hospitalist on call if the hospitalist that took care of you is not available. Once you are discharged, your primary care physician will handle any further medical issues. Please note that NO REFILLS for any discharge medications will be authorized once you are discharged, as it is imperative that you return to your primary care physician (or establish a relationship with a primary care physician if you do not have one) for your aftercare needs so that they can reassess your need for medications and monitor your lab values.  Discharge Instructions    Diet - low sodium heart healthy    Complete by:  As directed   Diabetic diet, stay hydrated. Can drink electrolyte supplements with low sugar. Eat as tolerated.     Discharge instructions    Complete by:  As directed   Do not take imodium just yet, follow up with gastroenterologist     Increase activity slowly    Complete by:  As directed    Current Discharge Medication List    START taking these medications   Details  ciprofloxacin (CIPRO) 500 MG tablet Take 1 tablet (500 mg total) by mouth 2 (two) times daily. Qty: 12 tablet, Refills: 0    metroNIDAZOLE (FLAGYL) 500 MG tablet Take 1 tablet (500 mg total) by mouth 3 (three) times daily. Qty: 18 tablet, Refills: 0      CONTINUE these medications which have NOT CHANGED   Details  albuterol (PROVENTIL HFA;VENTOLIN HFA) 108 (90 Base) MCG/ACT  inhaler Inhale 2 puffs into the lungs every 6 (six) hours as needed for wheezing or shortness of breath. Qty: 1 Inhaler, Refills: 0    Blood Glucose Monitoring Suppl (RELION CONFIRM GLUCOSE MONITOR) w/Device KIT Check your blood glucose once daily before breakfast Qty: 1 kit, Refills: 0    cholecalciferol (VITAMIN D) 1000 units tablet Take 5,000 Units by mouth daily.    fluticasone (FLONASE) 50 MCG/ACT nasal spray Place 2 sprays into both nostrils daily. Qty: 16 g, Refills: 6    glucose blood (RELION GLUCOSE TEST STRIPS) test strip Check your blood glucose once daily before breakfast Qty: 100 each, Refills: 12    Lancet Devices (ONE TOUCH DELICA LANCING DEV) MISC Refills: 0    loratadine (CLARITIN) 10 MG tablet Take 10 mg by mouth daily.    pantoprazole (PROTONIX) 40 MG tablet Take 1 tablet (40 mg total) by mouth 2 (two) times daily before a meal. Qty: 60 tablet, Refills: 3    Pyridoxine HCl (VITAMIN B6) 200 MG TABS Take by mouth daily.    RELION LANCETS STANDARD 21G MISC Check your blood sugar once daily before breakfast. Qty: 200 each, Refills: 0  atorvastatin (LIPITOR) 40 MG tablet Take 40 mg by mouth daily. Reported on 09/08/2015 Refills: 2      STOP taking these medications     benzonatate (TESSALON) 200 MG capsule      oseltamivir (TAMIFLU) 75 MG capsule         Allergies  Allergen Reactions  . Codeine Itching  . Sulfa Antibiotics     The results of significant diagnostics from this hospitalization (including imaging, microbiology, ancillary and laboratory) are listed below for reference.    Significant Diagnostic Studies: Ct Abdomen Pelvis W Contrast  09/08/2015  CLINICAL DATA:  Abdominal pain with nausea, vomiting and diarrhea. Mid abdominal pain since this morning. EXAM: CT ABDOMEN AND PELVIS WITH CONTRAST TECHNIQUE: Multidetector CT imaging of the abdomen and pelvis was performed using the standard protocol following bolus administration of intravenous  contrast. CONTRAST:  115m ISOVUE-300 IOPAMIDOL (ISOVUE-300) INJECTION 61% COMPARISON:  Ultrasound earlier this day. FINDINGS: Lower chest: The included lung bases are clear. Macroscopic calcifications within both breasts. Liver: Decreased density consistent with steatosis. No focal lesion. Hepatobiliary: Gallbladder physiologically distended, no calcified stone. No biliary dilatation. Pancreas: No ductal dilatation or inflammation. Spleen: Normal. Adrenal glands: No nodule. Kidneys: Symmetric renal enhancement and excretion. No hydronephrosis. Stomach/Bowel: Stomach physiologically distended. Large and small bowel fluid-filled with mild small bowel wall thickening. No mesenteric edema or perienteric inflammation. The large or small bowel dilatation. The appendix is normal. Vascular/Lymphatic: No retroperitoneal adenopathy. Abdominal aorta is normal in caliber. Reproductive: Uterus normal in size. Question of right fundal fibroid. Bilateral tubal ligation clips. Follicular cyst in the left ovary measures 2.5 cm, no dedicated imaging follow-up is needed. Bladder: Physiologically distended, no wall thickening. Other: No free air, free fluid, or intra-abdominal fluid collection. Musculoskeletal: There are no acute or suspicious osseous abnormalities. IMPRESSION: 1. Fluid-filled large and small bowel with mild small bowel wall thickening, enteritis pattern. No obstruction. 2. Otherwise no acute abnormality. Electronically Signed   By: MJeb LeveringM.D.   On: 09/08/2015 20:42   [place]dom[place] Limited Ruq  09/08/2015  CLINICAL DATA:  Right upper quadrant pain for 2 months, worse today. Nausea, vomiting and diarrhea today. Initial encounter. EXAM: [place] LIMITED - RIGHT UPPER QUADRANT COMPARISON:  None. FINDINGS: Gallbladder: No gallstones or wall thickening visualized. No sonographic Murphy sign noted by sonographer. Common bile duct: Diameter: 0.6 cm Liver: Demonstrates some fatty infiltration with focal sparing  along the gallbladder. No focal lesion or intrahepatic biliary ductal dilatation. IMPRESSION: Negative for gallstones.  No acute abnormality. Fatty infiltration of the liver. Electronically Signed   By: TInge RiseM.D.   On: 09/08/2015 18:02    Microbiology: No results found for this or any previous visit (from the past 240 hour(s)).   Labs: Basic Metabolic Panel:  Recent Labs Lab 09/08/15 1624 09/09/15 0201  NA 138 137  K 3.7 3.8  CL 104 105  CO2 16* 20*  GLUCOSE 154* 224*  BUN 6 <5*  CREATININE 1.02* 0.87  CALCIUM 9.7 8.3*   Liver Function Tests:  Recent Labs Lab 09/08/15 1624  AST 29  ALT 20  ALKPHOS 53  BILITOT 1.4*  PROT 8.0  ALBUMIN 4.5    Recent Labs Lab 09/08/15 1624  LIPASE 29   CBC:  Recent Labs Lab 09/08/15 1624 09/09/15 0201  WBC 13.2* 7.3  NEUTROABS  --  6.6  HGB 13.6 12.4  HCT 43.9 39.4  MCV 87.3 86.2  PLT 268 163   CBG:  Recent Labs Lab 09/09/15  [place]    Signed: Sea Bright  [facility] 09/09/2015, 10:36 AM

## 2015-09-09 NOTE — Discharge Instructions (Signed)

## 2015-09-09 NOTE — Progress Notes (Signed)
Patient has a stool report form lab GI PCR + Norivirus, notified  Dr. Myna Bright

## 2015-09-10 ENCOUNTER — Ambulatory Visit (AMBULATORY_SURGERY_CENTER): Payer: Self-pay

## 2015-09-10 ENCOUNTER — Other Ambulatory Visit: Payer: Self-pay

## 2015-09-10 VITALS — Ht 63.0 in | Wt 188.0 lb

## 2015-09-10 DIAGNOSIS — R195 Other fecal abnormalities: Secondary | ICD-10-CM

## 2015-09-10 LAB — URINE CULTURE: Culture: 2000 — AB

## 2015-09-10 LAB — HEMOGLOBIN A1C
Hgb A1c MFr Bld: 6.8 % — ABNORMAL HIGH (ref 4.8–5.6)
Mean Plasma Glucose: 148 mg/dL

## 2015-09-10 MED ORDER — POLYETHYLENE GLYCOL 3350 17 GM/SCOOP PO POWD: 1.0000 | Freq: Once | ORAL | Status: DC

## 2015-09-10 MED ORDER — ONDANSETRON HCL 4 MG PO TABS: 4.0000 mg | ORAL_TABLET | Freq: Four times a day (QID) | ORAL | Status: DC | PRN

## 2015-09-10 MED ORDER — POLYETHYLENE GLYCOL 3350 17 GM/SCOOP PO POWD: 1.0000 | Freq: Every day | ORAL | Status: DC

## 2015-09-10 NOTE — Progress Notes (Signed)
No allergies to eggs or soy No past problems with anesthesia No diet meds No home oxygen  Has email and internet; registered for emmis

## 2015-09-14 ENCOUNTER — Ambulatory Visit: Payer: BLUE CROSS/BLUE SHIELD | Admitting: Family Medicine

## 2015-09-16 ENCOUNTER — Encounter: Payer: Self-pay | Admitting: Internal Medicine

## 2015-09-16 ENCOUNTER — Ambulatory Visit (AMBULATORY_SURGERY_CENTER): Payer: BLUE CROSS/BLUE SHIELD | Admitting: Internal Medicine

## 2015-09-16 VITALS — BP 172/95 | HR 70 | Temp 99.6°F | Resp 16 | Ht 63.0 in | Wt 188.0 lb

## 2015-09-16 DIAGNOSIS — K2950 Unspecified chronic gastritis without bleeding: Secondary | ICD-10-CM | POA: Diagnosis not present

## 2015-09-16 DIAGNOSIS — R1013 Epigastric pain: Secondary | ICD-10-CM

## 2015-09-16 DIAGNOSIS — R195 Other fecal abnormalities: Secondary | ICD-10-CM

## 2015-09-16 DIAGNOSIS — K317 Polyp of stomach and duodenum: Secondary | ICD-10-CM | POA: Diagnosis not present

## 2015-09-16 DIAGNOSIS — D124 Benign neoplasm of descending colon: Secondary | ICD-10-CM

## 2015-09-16 DIAGNOSIS — D125 Benign neoplasm of sigmoid colon: Secondary | ICD-10-CM | POA: Diagnosis not present

## 2015-09-16 DIAGNOSIS — R194 Change in bowel habit: Secondary | ICD-10-CM | POA: Diagnosis not present

## 2015-09-16 LAB — GLUCOSE, CAPILLARY
Glucose-Capillary: 96 mg/dL (ref 65–99)
Glucose-Capillary: 81 mg/dL (ref 65–99)

## 2015-09-16 MED ORDER — SODIUM CHLORIDE 0.9 % IV SOLN: 500.0000 mL | INTRAVENOUS | Status: DC

## 2015-09-16 NOTE — Progress Notes (Signed)
Report to PACU, RN, vss, BBS= Clear.  

## 2015-09-16 NOTE — Patient Instructions (Signed)
YOU HAD AN ENDOSCOPIC PROCEDURE TODAY AT THE [facility]:   Refer to the procedure report that was given to you for any specific questions about what was found during the examination.  If the procedure report does not answer your questions, please call your gastroenterologist to clarify.  If you requested that your care partner not be given the details of your procedure findings, then the procedure report has been included in a sealed envelope for you to review at your convenience later.  YOU SHOULD EXPECT: Some feelings of bloating in the abdomen. Passage of more gas than usual.  Walking can help get rid of the air that was put into your GI tract during the procedure and reduce the bloating. If you had a lower endoscopy (such as a colonoscopy or flexible sigmoidoscopy) you may notice spotting of blood in your stool or on the toilet paper. If you underwent a bowel prep for your procedure, you may not have a normal bowel movement for a few days.  Please Note:  You might notice some irritation and congestion in your nose or some drainage.  This is from the oxygen used during your procedure.  There is no need for concern and it should clear up in a day or so.  SYMPTOMS TO REPORT IMMEDIATELY:   Following lower endoscopy (colonoscopy or flexible sigmoidoscopy):  Excessive amounts of blood in the stool  Significant tenderness or worsening of abdominal pains  Swelling of the abdomen that is new, acute  Fever of 100F or higher   Following upper endoscopy (EGD)  Vomiting of blood or coffee ground material  New chest pain or pain under the shoulder blades  Painful or persistently difficult swallowing  New shortness of breath  Fever of 100F or higher  Black, tarry-looking stools  For urgent or emergent issues, a gastroenterologist can be reached at any hour by calling [phone].   DIET: Your first meal following the procedure should be a small meal and then it is ok to progress to  your normal diet. Heavy or fried foods are harder to digest and may make you feel nauseous or bloated.  Likewise, meals heavy in dairy and vegetables can increase bloating.  Drink plenty of fluids but you should avoid alcoholic beverages for 24 hours.  ACTIVITY:  You should plan to take it easy for the rest of today and you should NOT DRIVE or use heavy machinery until tomorrow (because of the sedation medicines used during the test).    FOLLOW UP: Our staff will call the number listed on your records the next business day following your procedure to check on you and address any questions or concerns that you may have regarding the information given to you following your procedure. If we do not reach you, we will leave a message.  However, if you are feeling well and you are not experiencing any problems, there is no need to return our call.  We will assume that you have returned to your regular daily activities without incident.  If any biopsies were taken you will be contacted by phone or by letter within the next 1-3 weeks.  Please call us at [phone] if you have not heard about the biopsies in 3 weeks.    SIGNATURES/CONFIDENTIALITY: You and/or your care partner have signed paperwork which will be entered into your electronic medical record.  These signatures attest to the fact that that the information above on your After Visit Summary has been reviewed   and is understood.  Full responsibility of the confidentiality of this discharge information lies with you and/or your care-partner. 

## 2015-09-16 NOTE — Op Note (Signed)
[place] Patient Name: Nicole Bryant Procedure Date: 09/16/2015 2:15 PM MRN: [id number] Endoscopist: Jerene Bears , MD Age: 45 Date of Birth: 12-17-1970 Gender: Female Procedure:                Colonoscopy Indications:              Epigastric abdominal pain, Heme positive stool,                            Change in bowel habits Medicines:                Monitored Anesthesia Care Procedure:                Pre-Anesthesia Assessment:                           - Prior to the procedure, a History and Physical                            was performed, and patient medications and                            allergies were reviewed. The patient's tolerance of                            previous anesthesia was also reviewed. The risks                            and benefits of the procedure and the sedation                            options and risks were discussed with the patient.                            All questions were answered, and informed consent                            was obtained. Prior Anticoagulants: The patient has                            taken no previous anticoagulant or antiplatelet                            agents. ASA Grade Assessment: II - A patient with                            mild systemic disease. After reviewing the risks                            and benefits, the patient was deemed in                            satisfactory condition to undergo the procedure.  After obtaining informed consent, the colonoscope                            was passed under direct vision. Throughout the                            procedure, the patient's blood pressure, pulse, and                            oxygen saturations were monitored continuously. The                            Model CF-HQ190L [id number] scope was introduced                            through the anus and advanced to the the terminal        ileum. The colonoscopy was performed without                            difficulty. The patient tolerated the procedure                            well. The quality of the bowel preparation was                            excellent. The terminal ileum, ileocecal valve,                            appendiceal orifice, and rectum were photographed. Scope In: 2:16:08 PM Scope Out: 2:33:26 PM Scope Withdrawal Time: 0 hours 13 minutes 56 seconds  Total Procedure Duration: 0 hours 17 minutes 18 seconds  Findings:                 The terminal ileum appeared normal.                           Two sessile polyps were found in the sigmoid colon                            and descending colon. The polyps were 4 to 5 mm in                            size. These polyps were removed with a cold snare.                            Resection and retrieval were complete.                           Anal papilla(e) were hypertrophied. Complications:            No immediate complications. Estimated Blood Loss:     Estimated blood loss: none. Impression:               - The examined portion of the ileum was normal.                           -  Two 4 to 5 mm polyps in the sigmoid colon and in                            the descending colon, removed with a cold snare.                            Resected and retrieved.                           - Anal papillae were hypertrophied on retroflexed                            view. Recommendation:           - Patient has a contact number available for                            emergencies. The signs and symptoms of potential                            delayed complications were discussed with the                            patient. Return to normal activities tomorrow.                            Written discharge instructions were provided to the                            patient.                           - Resume previous diet.                           - Continue  present medications.                           - Await pathology results.                           - Repeat colonoscopy for surveillance based on                            pathology results.                           - If epigastric pain continues consider gastric                            empyting scan. [place] negative for gallstones. CT                            showed mid small bowel thickening, but in the  setting of confirmed Norovirus.                           - Office follow-up next available. Jerene Bears, MD 09/16/2015 2:46:01 PM This report has been signed electronically.

## 2015-09-16 NOTE — Progress Notes (Signed)
Called to room to assist during endoscopic procedure.  Patient ID and intended procedure confirmed with present staff. Received instructions for my participation in the procedure from the performing physician.  

## 2015-09-16 NOTE — Op Note (Signed)
[place] Patient Name: Nicole Bryant Procedure Date: 09/16/2015 1:55 PM MRN: [id number] Endoscopist: Jerene Bears , MD Age: 45 Date of Birth: 1970-05-31 Gender: Female Procedure:                Upper GI endoscopy Indications:              Epigastric abdominal pain, Gastro-esophageal reflux                            disease, Heme positive stool Medicines:                Monitored Anesthesia Care Procedure:                Pre-Anesthesia Assessment:                           - Prior to the procedure, a History and Physical                            was performed, and patient medications and                            allergies were reviewed. The patient's tolerance of                            previous anesthesia was also reviewed. The risks                            and benefits of the procedure and the sedation                            options and risks were discussed with the patient.                            All questions were answered, and informed consent                            was obtained. Prior Anticoagulants: The patient has                            taken no previous anticoagulant or antiplatelet                            agents. ASA Grade Assessment: II - A patient with                            mild systemic disease. After reviewing the risks                            and benefits, the patient was deemed in                            satisfactory condition to undergo the procedure.  After obtaining informed consent, the endoscope was                            passed under direct vision. Throughout the                            procedure, the patient's blood pressure, pulse, and                            oxygen saturations were monitored continuously. The                            Model GIF-HQ190 [id number]) scope was introduced                            through the mouth, and advanced to the second part                        of duodenum. The upper GI endoscopy was                            accomplished without difficulty. The patient                            tolerated the procedure well. Scope In: Scope Out: Findings:                 The examined esophagus was normal.                           Multiple 3 to 5 mm sessile polyps with no bleeding                            were found in the cardia, in the gastric fundus and                            in the gastric body. Multiple biopsies were                            obtained with cold forceps for histology in a                            targeted manner in the gastric fundus and in the                            gastric body.                           The exam of the stomach was otherwise normal.                           Biopsies were taken with a cold forceps in the                            gastric body,  at the incisura and in the gastric                            antrum for histology and Helicobacter pylori                            testing.                           The examined duodenum was normal. Complications:            No immediate complications. Estimated Blood Loss:     Estimated blood loss was minimal. Impression:               - Normal esophagus.                           - Multiple gastric polyps. Biopsied.                           - Otherwise normal stomach. Biopsies were taken                            with a cold forceps for histology and Helicobacter                            pylori testing.                           - Normal examined duodenum. Recommendation:           - Patient has a contact number available for                            emergencies. The signs and symptoms of potential                            delayed complications were discussed with the                            patient. Return to normal activities tomorrow.                            Written discharge instructions were provided to  the                            patient.                           - Resume previous diet.                           - Continue present medications.                           - Await pathology results.                           -  Perform a colonoscopy today. Jerene Bears, MD 09/16/2015 2:39:31 PM This report has been signed electronically.

## 2015-09-17 ENCOUNTER — Telehealth: Payer: Self-pay

## 2015-09-17 ENCOUNTER — Encounter: Payer: Self-pay | Admitting: Internal Medicine

## 2015-09-17 NOTE — Telephone Encounter (Signed)
  Follow up Call-  Call back number 09/16/2015  Post procedure Call Back phone  # [phone]  Permission to leave phone message Yes     Patient questions:  Do you have a fever, pain , or abdominal swelling? No. Pain Score  0 *  Have you tolerated food without any problems? Yes.    Have you been able to return to your normal activities? Yes.    Do you have any questions about your discharge instructions: Diet   No. Medications  No. Follow up visit  No.  Do you have questions or concerns about your Care? No.  Actions: * If pain score is 4 or above: No action needed, pain <4.

## 2015-09-21 ENCOUNTER — Encounter: Payer: Self-pay | Admitting: Internal Medicine

## 2015-09-22 ENCOUNTER — Encounter: Payer: Self-pay | Admitting: Internal Medicine

## 2015-09-22 ENCOUNTER — Other Ambulatory Visit: Payer: Self-pay

## 2015-09-22 ENCOUNTER — Ambulatory Visit: Payer: BLUE CROSS/BLUE SHIELD | Admitting: Family Medicine

## 2015-09-22 DIAGNOSIS — R1084 Generalized abdominal pain: Secondary | ICD-10-CM

## 2015-09-22 NOTE — Telephone Encounter (Signed)
Would recommend 4 hr gastric emptying study Please arrange

## 2015-09-25 ENCOUNTER — Ambulatory Visit (HOSPITAL_COMMUNITY)
Admission: RE | Admit: 2015-09-25 | Discharge: 2015-09-25 | Disposition: A | Discharge status: Home / Self Care | Payer: BLUE CROSS/BLUE SHIELD | Source: Ambulatory Visit | Attending: Internal Medicine | Admitting: Internal Medicine

## 2015-09-25 ENCOUNTER — Other Ambulatory Visit: Payer: Self-pay

## 2015-09-25 DIAGNOSIS — R1084 Generalized abdominal pain: Secondary | ICD-10-CM | POA: Insufficient documentation

## 2015-09-25 DIAGNOSIS — R109 Unspecified abdominal pain: Secondary | ICD-10-CM | POA: Diagnosis not present

## 2015-09-25 MED ORDER — TECHNETIUM TC 99M SULFUR COLLOID
2.0000 | Freq: Once | INTRAVENOUS | Status: AC | PRN
  Administered 2015-09-25: 2 via INTRAVENOUS

## 2015-10-01 ENCOUNTER — Other Ambulatory Visit: Payer: Self-pay

## 2015-10-01 ENCOUNTER — Ambulatory Visit (HOSPITAL_COMMUNITY)
Admission: RE | Admit: 2015-10-01 | Discharge: 2015-10-01 | Disposition: A | Discharge status: Home / Self Care | Payer: BLUE CROSS/BLUE SHIELD | Source: Ambulatory Visit | Attending: Internal Medicine | Admitting: Internal Medicine

## 2015-10-01 DIAGNOSIS — R109 Unspecified abdominal pain: Secondary | ICD-10-CM | POA: Diagnosis not present

## 2015-10-01 DIAGNOSIS — R1084 Generalized abdominal pain: Secondary | ICD-10-CM | POA: Insufficient documentation

## 2015-10-01 MED ORDER — TECHNETIUM TC 99M MEBROFENIN IV KIT
5.5000 | PACK | Freq: Once | INTRAVENOUS | Status: AC | PRN
  Administered 2015-10-01: 6 via INTRAVENOUS

## 2015-10-01 MED ORDER — DEXLANSOPRAZOLE 60 MG PO CPDR: 60.0000 mg | DELAYED_RELEASE_CAPSULE | Freq: Every day | ORAL | Status: DC

## 2015-10-01 NOTE — Telephone Encounter (Signed)
Please let the patient know I understand Nicole Bryant frustration Multiple studies have been unrevealing at this is a process of elimination Hopefully the new PPI will help with the pain and nausea Can also be given a trial of Reglan 10 mg each morning and each evening to help with nausea. This also can treat functional dyspepsia. Regarding the HIDA, one cannot have an ejection fraction that is too high.  Nicole Bryant gallbladder functions well.

## 2015-10-02 ENCOUNTER — Other Ambulatory Visit: Payer: Self-pay

## 2015-10-02 MED ORDER — METOCLOPRAMIDE HCL 10 MG PO TABS
10.0000 mg | ORAL_TABLET | Freq: Two times a day (BID) | ORAL | Status: DC
Start: 2015-10-02 — End: 2016-01-22

## 2015-10-05 ENCOUNTER — Encounter: Payer: Self-pay | Admitting: Internal Medicine

## 2015-10-05 ENCOUNTER — Ambulatory Visit: Payer: BLUE CROSS/BLUE SHIELD | Admitting: Family Medicine

## 2015-10-06 ENCOUNTER — Ambulatory Visit (HOSPITAL_COMMUNITY): Payer: BLUE CROSS/BLUE SHIELD

## 2015-10-06 ENCOUNTER — Telehealth: Payer: Self-pay | Admitting: *Deleted

## 2015-10-06 NOTE — Telephone Encounter (Signed)
[employer] [place] has approved patient's Dexilant from 10/02/15-05/15/2038. Reference number: [id number]

## 2015-11-02 ENCOUNTER — Ambulatory Visit: Payer: BLUE CROSS/BLUE SHIELD | Admitting: Family Medicine

## 2015-11-05 ENCOUNTER — Encounter: Payer: Self-pay | Admitting: *Deleted

## 2015-11-09 ENCOUNTER — Encounter: Payer: Self-pay | Admitting: Internal Medicine

## 2015-11-24 ENCOUNTER — Other Ambulatory Visit (INDEPENDENT_AMBULATORY_CARE_PROVIDER_SITE_OTHER): Payer: BLUE CROSS/BLUE SHIELD

## 2015-11-24 ENCOUNTER — Encounter: Payer: Self-pay | Admitting: Internal Medicine

## 2015-11-24 ENCOUNTER — Ambulatory Visit (INDEPENDENT_AMBULATORY_CARE_PROVIDER_SITE_OTHER): Payer: BLUE CROSS/BLUE SHIELD | Admitting: Internal Medicine

## 2015-11-24 VITALS — BP 158/102 | HR 100 | Ht 63.0 in | Wt 192.6 lb

## 2015-11-24 DIAGNOSIS — R1013 Epigastric pain: Secondary | ICD-10-CM

## 2015-11-24 DIAGNOSIS — R1011 Right upper quadrant pain: Secondary | ICD-10-CM

## 2015-11-24 DIAGNOSIS — R110 Nausea: Secondary | ICD-10-CM

## 2015-11-24 DIAGNOSIS — K219 Gastro-esophageal reflux disease without esophagitis: Secondary | ICD-10-CM | POA: Diagnosis not present

## 2015-11-24 LAB — COMPREHENSIVE METABOLIC PANEL
ALK PHOS: 70 U/L (ref 39–117)
ALT: 17 U/L (ref 0–35)
AST: 15 U/L (ref 0–37)
Albumin: 4.8 g/dL (ref 3.5–5.2)
BILIRUBIN TOTAL: 0.7 mg/dL (ref 0.2–1.2)
BUN: 8 mg/dL (ref 6–23)
CALCIUM: 10.4 mg/dL (ref 8.4–10.5)
CO2: 27 mEq/L (ref 19–32)
CREATININE: 0.84 mg/dL (ref 0.40–1.20)
Chloride: 101 mEq/L (ref 96–112)
GFR: 77.89 mL/min (ref >60.00)
GLUCOSE: 153 mg/dL — AB (ref 70–99)
Potassium: 4.1 mEq/L (ref 3.5–5.1)
Sodium: 136 mEq/L (ref 135–145)
TOTAL PROTEIN: 8.5 g/dL — AB (ref 6.0–8.3)

## 2015-11-24 LAB — CBC WITH DIFFERENTIAL/PLATELET
BASOS ABS: 0 10*3/uL (ref 0.0–0.1)
Basophils Relative: 0.3 % (ref 0.0–3.0)
Eosinophils Absolute: 0.1 10*3/uL (ref 0.0–0.7)
Eosinophils Relative: 1 % (ref 0.0–5.0)
HEMATOCRIT: 41 % (ref 36.0–46.0)
Hemoglobin: 13.7 g/dL (ref 12.0–15.0)
LYMPHS PCT: 21.2 % (ref 12.0–46.0)
Lymphs Abs: 2.1 10*3/uL (ref 0.7–4.0)
MCHC: 33.3 g/dL (ref 30.0–36.0)
MCV: 81.6 fl (ref 78.0–100.0)
MONOS PCT: 7.1 % (ref 3.0–12.0)
Monocytes Absolute: 0.7 10*3/uL (ref 0.1–1.0)
Neutro Abs: 6.9 10*3/uL (ref 1.4–7.7)
Neutrophils Relative %: 70.4 % (ref 43.0–77.0)
Platelets: 272 10*3/uL (ref 150.0–400.0)
RBC: 5.03 Mil/uL (ref 3.87–5.11)
RDW: 15.1 % (ref 11.5–15.5)
WBC: 9.8 10*3/uL (ref 4.0–10.5)

## 2015-11-24 NOTE — Progress Notes (Signed)
Subjective:    Patient ID: Nicole Bryant, female    DOB: 1970/12/02, 45 y.o.   MRN: [id number]  HPI Nicole Bryant a 45 year old female with a past medical history of diabetes, hyperlipidemia and GERD who seen in follow-up. She was initially seen in late March 2017 to evaluate abdominal pain and heme positive stool.   She had also experienced a change in bowel habits. After her last visit with me in the office she was diagnosed with influenza. This improved but she subsequently developed norovirus which resolved.  After her initial visit with me she came for upper endoscopy and colonoscopy which were performed on 09/16/2015. Upper endoscopy revealed small 3-5 mm sessile polyps in the proximal stomach. These were biopsied and found to be fundic gland polyps. The stomach was normal as was the examined duodenum. Gastric antrum and body biopsies showed slight chronic inflammation with hyperemia negative for H. pylori, metaplasia and dysplasia. Colonoscopy revealed 2 sessile polyps in the left colon. These were removed with snare. There were found to be tubular adenomas.  After her visit she continue to have issues with nausea epigastric and right upper quadrant abdominal pain. Appetite was poor. Gastric empty scan was performed which was normal. HIDA scan with CCK was performed which also was normal. Due to persistent symptoms she was given a trial of metoclopramide initially 10 mg twice a day. She reports this helped significantly but seemed to lose its efficacy. She emailed and we increased the dose to 3 times a day before meals and at bedtime. She has continued pantoprazole 40 mg twice a day before meals but feels that previous use of omeprazole was more effective.  She reports that she continues to have issues with intermittent nausea, which seems to be worse in the morning. With the use of metoclopramide she has been able to be more regularly with a better appetite. Nausea has  improved with metoclopramide but not completely resolved. She reports that she develops right-sided abdominal pain worse after a full meal. Also worse with moving and deep breath at times. She reports having right-sided upper abdominal pain for 1-2 days after her HIDA scan. Bowel movements have been regular to slightly loose. No blood in her stool or melena. She still doesn't feel like her usual self from an upper GI perspective which has been present since February to March of this year.  No fevers or chills. No weight loss in fact she has gained 6 pounds by our scale since her last visit  She reports regular menstrual periods.  Review of Systems As per history of present illness, otherwise negative  Current Medications, Allergies, Past Medical History, Past Surgical History, Family History and Social History were reviewed in [employer] record.     Objective:   Physical Exam BP 158/102 mmHg  Pulse 100  Ht 5\' 3"  (1.6 m)  Wt 192 lb 9.6 oz (87.363 kg)  BMI 34.13 kg/m2  LMP 11/09/2015 (Approximate) Constitutional: Well-developed and well-nourished. No distress. HEENT: Normocephalic and atraumatic. Oropharynx is clear and moist. No oropharyngeal exudate. Conjunctivae are normal.  No scleral icterus. Neck: Neck supple. Trachea midline. Cardiovascular: Normal rate, regular rhythm and intact distal pulses. No M/R/G Pulmonary/chest: Effort normal and breath sounds normal. No wheezing, rales or rhonchi. Abdominal: Soft, Right upper quadrant and epigastric tenderness without rebound or guarding nontender, nondistended. Bowel sounds active throughout. There are no masses palpable. No hepatosplenomegaly. Extremities: no clubbing, cyanosis, or edema Lymphadenopathy: No cervical adenopathy noted. Neurological: Alert  and oriented to person place and time. Skin: Skin is warm and dry. No rashes noted. Psychiatric: Normal mood and affect. Behavior is normal.   CBC      Component Value Date/Time   WBC 9.8 11/24/2015 1558   RBC 5.03 11/24/2015 1558   HGB 13.7 11/24/2015 1558   HCT 41.0 11/24/2015 1558   PLT 272.0 11/24/2015 1558   MCV 81.6 11/24/2015 1558   MCH 27.1 09/09/2015 0201   MCHC 33.3 11/24/2015 1558   RDW 15.1 11/24/2015 1558   LYMPHSABS 2.1 11/24/2015 1558   MONOABS 0.7 11/24/2015 1558   EOSABS 0.1 11/24/2015 1558   BASOSABS 0.0 11/24/2015 1558    CMP     Component Value Date/Time   NA 136 11/24/2015 1558   K 4.1 11/24/2015 1558   CL 101 11/24/2015 1558   CO2 27 11/24/2015 1558   GLUCOSE 153* 11/24/2015 1558   BUN 8 11/24/2015 1558   CREATININE 0.84 11/24/2015 1558   CALCIUM 10.4 11/24/2015 1558   PROT 8.5* 11/24/2015 1558   ALBUMIN 4.8 11/24/2015 1558   AST 15 11/24/2015 1558   ALT 17 11/24/2015 1558   ALKPHOS 70 11/24/2015 1558   BILITOT 0.7 11/24/2015 1558   GFRNONAA >60 09/09/2015 0201   GFRAA >60 09/09/2015 0201    Celiac panel - neg  NUCLEAR MEDICINE GASTRIC EMPTYING SCAN   TECHNIQUE: After oral ingestion of radiolabeled meal, sequential abdominal images were obtained for 120 minutes. Residual percentage of activity remaining within the stomach was calculated at 60 and 120 minutes.   RADIOPHARMACEUTICALS:  2.0 mCi Tc-77m MDP labeled sulfur colloid orally   COMPARISON:  None.   FINDINGS: Expected location of the stomach in the left upper quadrant. Ingested meal empties the stomach gradually over the course of the study with 57.3% retention at 60 min and 23.4% retention at 120 min (normal retention less than 30% at a 120 min).   IMPRESSION: Normal gastric emptying study.     Electronically Signed   By: Lowella Grip III M.D.   On: 09/25/2015 10:45   NUCLEAR MEDICINE HEPATOBILIARY IMAGING WITH GALLBLADDER EF   TECHNIQUE: Sequential images of the abdomen were obtained out to 60 minutes following intravenous administration of radiopharmaceutical. After oral ingestion of Ensure, gallbladder  ejection fraction was determined. At 60 min, normal ejection fraction is greater than 33%.   RADIOPHARMACEUTICALS:  5.5 mCi Tc-70m  Choletec IV   COMPARISON:  Abdominal ultrasound of September 08, 2015   FINDINGS: Prompt uptake and biliary excretion of activity by the liver is seen. Gallbladder activity is visualized, consistent with patency of cystic duct. Biliary activity passes into small bowel, consistent with patent common bile duct.   Calculated gallbladder ejection fraction is 86%%. (Normal gallbladder ejection fraction with Ensure is greater than 33%.)   IMPRESSION: Normal hepatobiliary scan with normal gallbladder ejection fraction.     Electronically Signed   By: David  Martinique M.D.   On: 10/01/2015 11:27      Assessment & Plan:  45 year old female with a past medical history of diabetes, hyperlipidemia and GERD who seen in follow-up.  1. Epigastric and RUQ pain/nausea -- symptoms could be that of functional dyspepsia, intermittent gastroparesis in the setting of diabetes, biliary dyskinesia/gallbladder pain, or post infectious irritable bowel. We have discussed this at length today. We again discussed the risk, benefits and alternatives to metoclopramide therapy. We discussed the long-term risk of neurologic complications such as tardive dyskinesia. This medication has been helpful for her and she  wishes to continue it for now. We will plan to do this short-term. Continue metoclopramide 10 mg 3 times a day before meals and at bedtime. I'm changing her PPI back to omeprazole 40 mg twice a day before meals as this seemed to work better than pantoprazole, subjectively. She can still use Zofran as needed for nausea per instructions. I'll have her follow-up in September 2017 to reassess symptoms. We did discuss surgical referral for consideration of elective cholecystectomy given persistent symptoms despite relatively negative workup. She understands that cholecystectomy may not result  in symptom improvement. We will see how she does in the coming months before making this decision.  2. History of adenomatous colon polyp -- follow-up in 5 years for surveillance colonoscopy  25 minutes spent with the patient today. Greater than 50% was spent in counseling and coordination of care with the patient

## 2015-11-24 NOTE — Patient Instructions (Addendum)
We have sent the following medications to your pharmacy for you to pick up at your convenience: Omeprazole 40 mg twice daily before meals (in place of pantoprazole)  Continue Reglan before meals and bedtime.  Continue zofran as needed.  Follow up with Dr Hilarie Fredrickson on Monday, 02/01/16 @ 11:00 am.  If you are age 45 or older, your body mass index should be between 23-30. Your Body mass index is 34.13 kg/(m^2). If this is out of the aforementioned range listed, please consider follow up with your Primary Care Provider.  If you are age 45 or younger, your body mass index should be between 19-25. Your Body mass index is 34.13 kg/(m^2). If this is out of the aformentioned range listed, please consider follow up with your Primary Care Provider.

## 2015-11-25 ENCOUNTER — Encounter: Payer: Self-pay | Admitting: Internal Medicine

## 2015-11-25 ENCOUNTER — Other Ambulatory Visit: Payer: Self-pay

## 2015-11-25 MED ORDER — OMEPRAZOLE 40 MG PO CPDR: 40.0000 mg | DELAYED_RELEASE_CAPSULE | Freq: Two times a day (BID) | ORAL | Status: DC

## 2015-11-27 ENCOUNTER — Ambulatory Visit: Payer: BLUE CROSS/BLUE SHIELD | Admitting: Family Medicine

## 2015-12-14 ENCOUNTER — Encounter: Payer: Self-pay | Admitting: Internal Medicine

## 2015-12-14 NOTE — Telephone Encounter (Signed)
Please refer to [facility] for evaluation of upper abdominal pain as discussed in my last note for consideration of cholecystectomy

## 2015-12-16 ENCOUNTER — Encounter: Payer: Self-pay | Admitting: Internal Medicine

## 2015-12-16 ENCOUNTER — Other Ambulatory Visit: Payer: Self-pay

## 2015-12-16 MED ORDER — METOCLOPRAMIDE HCL 10 MG PO TABS: 10.0000 mg | ORAL_TABLET | Freq: Four times a day (QID) | ORAL | 3 refills | Status: DC

## 2015-12-23 ENCOUNTER — Ambulatory Visit: Payer: Self-pay | Admitting: Surgery

## 2015-12-23 DIAGNOSIS — K811 Chronic cholecystitis: Secondary | ICD-10-CM | POA: Diagnosis not present

## 2015-12-23 NOTE — H&P (Signed)
Gaye Alken. Pandit 12/23/2015 4:13 PM Location: [facility] Patient #: [id number] DOB: 06/19/1970 Single / Language: Cleophus Molt / Race: White Female  History of Present Illness Adin Hector MD; 12/23/2015 5:40 PM) The patient is a 45 year old female who presents with non-malignant abdominal pain. Note for "Non-malignant abdominal pain": Patient sent for surgical consultation by her gastroenterologist given concerns of postprandial right upper quadrant pain with reproduction of symptoms with cholecystokinin injection on HIDA scan.  Pleasant woman. Comes today with a close friend. She's been struggling with intermittent abdominal pains for the past 6 months. She did have a flare of some upper abdominal pain nausea vomiting and diarrhea. Was diagnosed with a neurovirus. Chronic heartburn and reflux. She's been on proton pump inhibitor for over a year.  More recently she had some pain it's more focused in the right upper quadrant. Usually its stimulated after she eats. Sometimes she can feel nauseated if she has an empty stomach. Occasionally burping and belching. Her and asked medications were double. He went upper and lower endoscopy which were mostly underwhelming. Mild irritation stomach. Some adenomatous polyps. No major abnormality. CT scan and ultrasound showed no gallstones or cholecystitis. Crushable fatty change in the liver but no major changes in her LFTs. This rarely drink alcohol. Half hour to an hour without difficulty. She does not smoke. Usually moves her bowels every day to every other day. She had a tubal ligation but no other abdominal surgery. She had gastric emptying study given her history of diabetes. Normal. Was started on some Reglan anyway. No real improvement despite going up to 4 pills. She wondered if the metoclopramide did actually help for a day or 2 but not consistent and certainly not helping now. She did get a HIDA scan. Gallbladder  ejection fraction was elevated. She got sharp worsening of her right upper quadrant pain after given the cholecystokinin. Lasted for several days as well. Another attack. Based on those concerns, surgical consultation requested since no other etiology for her pain can be explained  No personal nor family history of GI/colon cancer, inflammatory bowel disease, irritable bowel syndrome, allergy such as Celiac Sprue, dietary/dairy problems, colitis, ulcers nor gastritis. No recent sick contacts/gastroenteritis. No travel outside the country. No changes in diet. No dysphagia to solids or liquids. No significant heartburn or reflux. No hematochezia, hematemesis, coffee ground emesis. No evidence of prior gastric/peptic ulceration.              Diagnosis 1. Surgical [P], gastric antrum and gastric body - SLIGHT CHRONIC INFLAMMATION WITH HYPEREMIA. - WARTHIN-STARRY STAIN NEGATIVE FOR HELICOBACTER PYLORI. - NO INTESTINAL METAPLASIA, DYSPLASIA, OR MALIGNANCY. 2. Surgical [P], gastric polyp - BENIGN FUNDIC GLAND POLYPS. - WARTHIN-STARRY STAIN NEGATIVE FOR HELICOBACTER PYLORI. - NO ADENOMATOUS CHANGE, DYSPLASIA, OR MALIGNANCY. 3. Surgical [P], descending, sigmoid, polyp (2) - TUBULAR ADENOMA. - NO HIGH GRADE DYSPLASIA OR MALIGNANCY. Claudette Laws MD Pathologist, Electronic Signature (Case signed 09/21/2015)   Other Problems Marjean Donna, Kelly; 12/23/2015 4:13 PM) Diabetes Mellitus Gastroesophageal Reflux Disease High blood pressure Hypercholesterolemia Thyroid Disease  Past Surgical History Marjean Donna, CMA; 12/23/2015 4:13 PM) Breast Biopsy Left. Colon Polyp Removal - Colonoscopy Thyroid Surgery  Diagnostic Studies History Marjean Donna, CMA; 12/23/2015 4:13 PM) Colonoscopy within last year Mammogram >3 years ago Pap Smear 1-5 years ago  Allergies Marjean Donna, CMA; 12/23/2015 4:14 PM) Codeine Phosphate *ANALGESICS - OPIOID*  Medication History (Sonya Bynum,  CMA; 12/23/2015 4:15 PM) Omeprazole (10MG  Capsule DR, Oral) Active. Loratadine (10MG  Capsule,  Oral) Active. Metoclopramide HCl (10MG  Tablet, Oral) Active. Flonase Allergy Relief (50MCG/ACT Suspension, Nasal) Active. Medications Reconciled  Social History Marjean Donna, CMA; 12/23/2015 4:13 PM) Alcohol use Occasional alcohol use. Caffeine use Carbonated beverages, Tea. No drug use Tobacco use Never smoker.  Family History Marjean Donna, CMA; 12/23/2015 4:13 PM) Arthritis Father. Cancer Family Members In General. Cerebrovascular Accident Father. Colon Polyps Family Members In General, Mother. Diabetes Mellitus Family Members In General. Heart Disease Family Members In General. Heart disease in female family member before age 81 Hypertension Family Members In General. Melanoma Family Members In General. Migraine Headache Father.     Review of Systems ([place]; 12/23/2015 4:13 PM) General Present- Appetite Loss and Weight Loss. Not Present- Chills, Fatigue, Fever, Night Sweats and Weight Gain. Skin Not Present- Change in Wart/Mole, Dryness, Hives, Jaundice, New Lesions, Non-Healing Wounds, Rash and Ulcer. HEENT Present- Nose Bleed, Seasonal Allergies and Wears glasses/contact lenses. Not Present- Earache, Hearing Loss, Hoarseness, Oral Ulcers, Ringing in the Ears, Sinus Pain, Sore Throat, Visual Disturbances and Yellow Eyes. Respiratory Present- Snoring. Not Present- Bloody sputum, Chronic Cough, Difficulty Breathing and Wheezing. Breast Not Present- Breast Mass, Breast Pain, Nipple Discharge and Skin Changes. Cardiovascular Not Present- Chest Pain, Difficulty Breathing Lying Down, Leg Cramps, Palpitations, Rapid Heart Rate, Shortness of Breath and Swelling of Extremities. Gastrointestinal Present- Abdominal Pain, Bloating, Constipation, Gets full quickly at meals, Indigestion and Nausea. Not Present- Bloody Stool, Change in Bowel Habits, Chronic diarrhea, Difficulty  Swallowing, Excessive gas, Hemorrhoids, Rectal Pain and Vomiting. Female Genitourinary Not Present- Frequency, Nocturia, Painful Urination, Pelvic Pain and Urgency. Musculoskeletal Present- Joint Stiffness. Not Present- Back Pain, Joint Pain, Muscle Pain, Muscle Weakness and Swelling of Extremities. Neurological Not Present- Decreased Memory, Fainting, Headaches, Numbness, Seizures, Tingling, Tremor, Trouble walking and Weakness. Psychiatric Not Present- Anxiety, Bipolar, Change in Sleep Pattern, Depression, Fearful and Frequent crying. Endocrine Present- Cold Intolerance and New Diabetes. Not Present- Excessive Hunger, Hair Changes, Heat Intolerance and Hot flashes. Hematology Not Present- Blood Thinners, Easy Bruising, Excessive bleeding, Gland problems, HIV and Persistent Infections.  Vitals (Sonya Bynum CMA; 12/23/2015 4:14 PM) 12/23/2015 4:13 PM Weight: 200 lb Height: 64in Body Surface Area: 1.96 m Body Mass Index: 34.33 kg/m  Temp.: 69F(Temporal)  Pulse: 76 (Regular)  BP: 128/76 (Sitting, Left Arm, Standard)      Physical Exam Adin Hector MD; 12/23/2015 5:40 PM)  General Mental Status-Alert. General Appearance-Not in acute distress, Not Sickly. Orientation-Oriented X3. Hydration-Well hydrated. Voice-Normal.  Integumentary Global Assessment Upon inspection and palpation of skin surfaces of the - Axillae: non-tender, no inflammation or ulceration, no drainage. and Distribution of scalp and body hair is normal. General Characteristics Temperature - normal warmth is noted.  Head and Neck Head-normocephalic, atraumatic with no lesions or palpable masses. Face Global Assessment - atraumatic, no absence of expression. Neck Global Assessment - no abnormal movements, no bruit auscultated on the right, no bruit auscultated on the left, no decreased range of motion, non-tender. Trachea-midline. Thyroid Gland Characteristics -  non-tender.  Eye Eyeball - Left-Extraocular movements intact, No Nystagmus. Eyeball - Right-Extraocular movements intact, No Nystagmus. Cornea - Left-No Hazy. Cornea - Right-No Hazy. Sclera/Conjunctiva - Left-No scleral icterus, No Discharge. Sclera/Conjunctiva - Right-No scleral icterus, No Discharge. Pupil - Left-Direct reaction to light normal. Pupil - Right-Direct reaction to light normal.  ENMT Ears Pinna - Left - no drainage observed, no generalized tenderness observed. Right - no drainage observed, no generalized tenderness observed. Nose and Sinuses External Inspection of the Nose - no  destructive lesion observed. Inspection of the nares - Left - quiet respiration. Right - quiet respiration. Mouth and Throat Lips - Upper Lip - no fissures observed, no pallor noted. Lower Lip - no fissures observed, no pallor noted. Nasopharynx - no discharge present. Oral Cavity/Oropharynx - Tongue - no dryness observed. Oral Mucosa - no cyanosis observed. Hypopharynx - no evidence of airway distress observed.  Chest and Lung Exam Inspection Movements - Normal and Symmetrical. Accessory muscles - No use of accessory muscles in breathing. Palpation Palpation of the chest reveals - Non-tender. Auscultation Breath sounds - Normal and Clear.  Cardiovascular Auscultation Rhythm - Regular. Murmurs & Other Heart Sounds - Auscultation of the heart reveals - No Murmurs and No Systolic Clicks.  Abdomen Inspection Inspection of the abdomen reveals - No Visible peristalsis and No Abnormal pulsations. Umbilicus - No Bleeding, No Urine drainage. Palpation/Percussion Palpation and Percussion of the abdomen reveal - Soft, Non Tender, No Rebound tenderness, No Rigidity (guarding) and No Cutaneous hyperesthesia. Note: Abdomen soft. Mild discomfort in right upper quadrant but no true Murphy sign. Rest of the abdomen is Nontender, nondistended. No guarding. No umbilical no other  hernias.  Female Genitourinary Sexual Maturity Tanner 5 - Adult hair pattern. Note: No vaginal bleeding nor discharge. No lymphadenopathy. No inguinal hernias.  Peripheral Vascular Upper Extremity Inspection - Left - No Cyanotic nailbeds, Not Ischemic. Right - No Cyanotic nailbeds, Not Ischemic.  Neurologic Neurologic evaluation reveals -normal attention span and ability to concentrate, able to name objects and repeat phrases. Appropriate fund of knowledge , normal sensation and normal coordination. Mental Status Affect - not angry, not paranoid. Cranial Nerves-Normal Bilaterally. Gait-Normal.  Neuropsychiatric Mental status exam performed with findings of-able to articulate well with normal speech/language, rate, volume and coherence, thought content normal with ability to perform basic computations and apply abstract reasoning and no evidence of hallucinations, delusions, obsessions or homicidal/suicidal ideation.  Musculoskeletal Global Assessment Spine, Ribs and Pelvis - no instability, subluxation or laxity. Right Upper Extremity - no instability, subluxation or laxity.  Lymphatic Head & Neck  General Head & Neck Lymphatics: Bilateral - Description - No Localized lymphadenopathy. Axillary  General Axillary Region: Bilateral - Description - No Localized lymphadenopathy. Femoral & Inguinal  Generalized Femoral & Inguinal Lymphatics: Left - Description - No Localized lymphadenopathy. Right - Description - No Localized lymphadenopathy.    Assessment & Plan Adin Hector MD; 12/23/2015 4:51 PM)  CHRONIC CHOLECYSTITIS WITHOUT CALCULUS (K81.1) Impression: History suspicious for episodes of biliary colic and postprandial right upper quadrant pain with reproduction of symptoms with cholecystokinin injection despite no gallstones and no gallbladder ejection fraction abnormality. She's had repeated endoscopies are otherwise underwhelming. The consistent with heartburn or  reflux. She did have an episode of probable neurovirus enterovirus 5 months ago that was more central in this is not like that.  Offered cholecystectomy. I cautioned that she is at risk for having persistent symptoms and this does not solve her problem, however workup is otherwise rather thorough with no other sources my objective data and improvement with other treatment regimens. She wishes to proceed with surgery  Current Plans You are being scheduled for surgery - Our schedulers will call you.  You should hear from our office's scheduling department within 5 working days about the location, date, and time of surgery. We try to make accommodations for patient's preferences in scheduling surgery, but sometimes the OR schedule or the surgeon's schedule prevents [place] from making those accommodations.  If you have not  heard from our office [phone]) in 5 working days, call the office and ask for your surgeon's nurse.  If you have other questions about your diagnosis, plan, or surgery, call the office and ask for your surgeon's nurse.  The anatomy & physiology of hepatobiliary & pancreatic function was discussed. The pathophysiology of gallbladder dysfunction was discussed. Natural history risks without surgery was discussed. I feel the risks of no intervention will lead to serious problems that outweigh the operative risks; therefore, I recommended cholecystectomy to remove the pathology. I explained laparoscopic techniques with possible need for an open approach. Probable cholangiogram to evaluate the bilary tract was explained as well.  Risks such as bleeding, infection, abscess, leak, injury to other organs, need for further treatment, heart attack, death, and other risks were discussed. I noted a good likelihood this will help address the problem. Possibility that this will not correct all abdominal symptoms was explained. Goals of post-operative recovery were discussed as well. We will work to  minimize complications. An educational handout further explaining the pathology and treatment options was given as well. Questions were answered. The patient expresses understanding & wishes to proceed with surgery.  Pt Education - Pamphlet Given - Laparoscopic Gallbladder Surgery: discussed with patient and provided information. Written instructions provided Pt Education - [facility] Laparosopic Post Op HCI (Riaan Toledo) Pt Education - [facility] Good Bowel Health (Merleen Picazo) Pt Education - Laparoscopic Cholecystectomy: gallbladder  Adin Hector, M.D., F.A.C.S. Gastrointestinal and Minimally Invasive Surgery [facility] [address] [place], [place] [address] [phone] Main / Paging

## 2015-12-29 ENCOUNTER — Encounter: Payer: Self-pay | Admitting: Family Medicine

## 2015-12-29 ENCOUNTER — Ambulatory Visit (INDEPENDENT_AMBULATORY_CARE_PROVIDER_SITE_OTHER): Payer: BLUE CROSS/BLUE SHIELD | Admitting: Family Medicine

## 2015-12-29 VITALS — BP 128/98 | HR 76 | Temp 98.3°F | Resp 12 | Ht 63.0 in | Wt 197.8 lb

## 2015-12-29 DIAGNOSIS — R1010 Upper abdominal pain, unspecified: Secondary | ICD-10-CM

## 2015-12-29 DIAGNOSIS — R030 Elevated blood-pressure reading, without diagnosis of hypertension: Secondary | ICD-10-CM

## 2015-12-29 DIAGNOSIS — E119 Type 2 diabetes mellitus without complications: Secondary | ICD-10-CM | POA: Diagnosis not present

## 2015-12-29 DIAGNOSIS — G479 Sleep disorder, unspecified: Secondary | ICD-10-CM | POA: Insufficient documentation

## 2015-12-29 DIAGNOSIS — R1011 Right upper quadrant pain: Secondary | ICD-10-CM

## 2015-12-29 DIAGNOSIS — IMO0001 Reserved for inherently not codable concepts without codable children: Secondary | ICD-10-CM

## 2015-12-29 DIAGNOSIS — G8929 Other chronic pain: Secondary | ICD-10-CM

## 2015-12-29 LAB — HEMOGLOBIN A1C: HEMOGLOBIN A1C: 7.4 % — AB (ref 4.6–6.5)

## 2015-12-29 NOTE — Assessment & Plan Note (Signed)
Asymptomatic. Last A1c improved. We'll check an A1c today.

## 2015-12-29 NOTE — Progress Notes (Signed)
[name], MD Phone: [phone]  SRAH CAYTON is a 45 y.o. female who presents today for f/u.  DIABETES Disease Monitoring: Blood Sugar ranges-not checking Polyuria/phagia/dipsia- no      Optho- sam in December  Medications: Compliance- not currently on medications  Elevated blood pressure: Blood pressure elevated today diastolically. Appears to have been elevated on most recent checks though patient reports in the surgeon's office last week it was in the normal range. No chest pain, shortness breath, or edema. No history of hypertension.  Right upper quadrant pain: Patient notes this has been a chronic issue. She has seen the surgeon to discuss cholecystectomy. They're planning on a cholecystectomy. Reglan does help. She is taking this 4 times a day. Also on Prilosec 40 mg twice daily. Workup thus far has been relatively unremarkable with the exception of some discomfort after the HIDA scan though she reports the HIDA scan was normal otherwise. Continues to have some nausea with this.  Sleep issues: Patient notes she has issues falling asleep and staying asleep at night. Typically goes to bed between 10 and 11 PM. The hour prior to bed she occasionally takes a bath. Also is on her phone or watching TV while in bed prior to going to sleep. Little caffeine intake in the morning. No alcohol use. Is taking over-the-counter sleep aids   PMH: nonsmoker.   ROS see history of present illness  Objective  Physical Exam Vitals:   12/29/15 0803  BP: (!) 128/98  Pulse: 76  Resp: 12  Temp: 98.3 F (36.8 C)    BP Readings from Last 3 Encounters:  12/29/15 (!) 128/98  11/24/15 (!) 158/102  09/16/15 (!) 172/95   Wt Readings from Last 3 Encounters:  12/29/15 197 lb 12.8 oz (89.7 kg)  11/24/15 192 lb 9.6 oz (87.4 kg)  09/16/15 188 lb (85.3 kg)    Physical Exam  Constitutional: No distress.  HENT:  Head: Normocephalic and atraumatic.  Cardiovascular: Normal rate,  regular rhythm and normal heart sounds.   Pulmonary/Chest: Effort normal and breath sounds normal.  Mild right lower rib tenderness  Abdominal: Soft. Bowel sounds are normal. She exhibits no distension. There is no tenderness. There is no rebound and no guarding.  Neurological: She is alert. Gait normal.  Skin: Skin is warm and dry. She is not diaphoretic.     Assessment/Plan: Please see individual problem list.  Non-insulin dependent type 2 diabetes mellitus ([place]) Asymptomatic. Last A1c improved. We'll check an A1c today.  Elevated blood pressure BP is been elevated recently in our system. Reports normal blood pressure at the surgeon's office last week. Discussed options for management including starting medication now versus monitoring for a week and rechecking. She opted to monitor at home for one week and then recheck in the office next week. If elevated at that time would start on an ACE inhibitor given history of diabetes.  Abdominal pain, chronic, right upper quadrant Followed by GI and now surgery. They are going to do a cholecystectomy per patient report. I discussed this may not take away her discomfort though it may resolve it. She will continue to follow with the surgeon and follow up for cholecystectomy.  Sleeping difficulty Advised on sleep hygiene. If no benefit with sleep hygiene measures such as decreasing screen time the hour prior to bed and relaxation prior to bed would consider medication short-term.   Orders Placed This Encounter  Procedures  . HgB A1c    No orders of the defined  types were placed in this encounter.   [name], MD [place]

## 2015-12-29 NOTE — Assessment & Plan Note (Signed)
BP is been elevated recently in our system. Reports normal blood pressure at the surgeon's office last week. Discussed options for management including starting medication now versus monitoring for a week and rechecking. She opted to monitor at home for one week and then recheck in the office next week. If elevated at that time would start on an ACE inhibitor given history of diabetes.

## 2015-12-29 NOTE — Assessment & Plan Note (Signed)
Advised on sleep hygiene. If no benefit with sleep hygiene measures such as decreasing screen time the hour prior to bed and relaxation prior to bed would consider medication short-term.

## 2015-12-29 NOTE — Assessment & Plan Note (Signed)
Followed by GI and now surgery. They are going to do a cholecystectomy per patient report. I discussed this may not take away her discomfort though it may resolve it. She will continue to follow with the surgeon and follow up for cholecystectomy.

## 2015-12-29 NOTE — Patient Instructions (Addendum)
Nice to see you. We will check your A1c. Please follow up with the surgeon regarding your gallbladder surgery. For your sleep please pick a set bedtime and stick to it. You should do relaxing activities the hour before bed. No TV or phone time prior to bed. You may take a warm bath or warm shower. Please check your blood pressure at home and return in one week for a nurse visit. Your goal blood pressures less than 130/90.

## 2015-12-29 NOTE — Progress Notes (Signed)
Pre visit review using our clinic review tool, if applicable. No additional management support is needed unless otherwise documented below in the visit note. 

## 2016-01-01 ENCOUNTER — Telehealth: Payer: Self-pay | Admitting: Family Medicine

## 2016-01-01 ENCOUNTER — Telehealth: Payer: Self-pay | Admitting: *Deleted

## 2016-01-01 NOTE — Telephone Encounter (Signed)
Please advise 

## 2016-01-01 NOTE — Telephone Encounter (Signed)
LM for patient to return call. Please transfer call to [facility] for results.

## 2016-01-01 NOTE — Telephone Encounter (Signed)
Patient has been notified

## 2016-01-01 NOTE — Telephone Encounter (Signed)
Patient would love to try to change diet again to get A1c down. But if Dr. Caryl Bis really thinks she needs to be placed on medication, then she will comply. Patient was informed of results.

## 2016-01-01 NOTE — Telephone Encounter (Signed)
Patient requested lab results  Pt contact [phone]

## 2016-01-01 NOTE — Telephone Encounter (Signed)
Diet and exercise is an option. We can refer her to a nutritionist or diabetic educator to help with this if she would like. If no improvement at her next visit with diet and exercise I would recommend medication.

## 2016-01-01 NOTE — Telephone Encounter (Signed)
Pt called returning your call. Thank you! °

## 2016-01-01 NOTE — Telephone Encounter (Deleted)
Spoke with patients daughter. Please see result note

## 2016-01-04 NOTE — Telephone Encounter (Signed)
Fransisco Beau gave patient results.

## 2016-01-07 ENCOUNTER — Encounter: Payer: Self-pay | Admitting: Internal Medicine

## 2016-01-27 ENCOUNTER — Encounter (HOSPITAL_COMMUNITY): Payer: Self-pay

## 2016-01-27 ENCOUNTER — Encounter (HOSPITAL_COMMUNITY)
Admission: RE | Admit: 2016-01-27 | Discharge: 2016-01-27 | Disposition: A | Discharge status: Home / Self Care | Payer: BLUE CROSS/BLUE SHIELD | Source: Ambulatory Visit | Attending: Surgery | Admitting: Surgery

## 2016-01-27 DIAGNOSIS — Z01812 Encounter for preprocedural laboratory examination: Secondary | ICD-10-CM | POA: Diagnosis not present

## 2016-01-27 DIAGNOSIS — K811 Chronic cholecystitis: Secondary | ICD-10-CM | POA: Diagnosis not present

## 2016-01-27 HISTORY — DX: Acute gastroenteropathy due to Norwalk agent: A08.11

## 2016-01-27 LAB — BASIC METABOLIC PANEL
ANION GAP: 7 (ref 5–15)
BUN: 16 mg/dL (ref 6–20)
CALCIUM: 9.3 mg/dL (ref 8.9–10.3)
CO2: 27 mmol/L (ref 22–32)
Chloride: 103 mmol/L (ref 101–111)
Creatinine, Ser: 0.86 mg/dL (ref 0.44–1.00)
GLUCOSE: 230 mg/dL — AB (ref 65–99)
Potassium: 4.2 mmol/L (ref 3.5–5.1)
SODIUM: 137 mmol/L (ref 135–145)

## 2016-01-27 LAB — CBC
HCT: 42.8 % (ref 36.0–46.0)
HEMOGLOBIN: 13.6 g/dL (ref 12.0–15.0)
MCH: 27.3 pg (ref 26.0–34.0)
MCHC: 31.8 g/dL (ref 30.0–36.0)
MCV: 85.8 fL (ref 78.0–100.0)
Platelets: 222 10*3/uL (ref 150–400)
RBC: 4.99 MIL/uL (ref 3.87–5.11)
RDW: 14.6 % (ref 11.5–15.5)
WBC: 5.9 10*3/uL (ref 4.0–10.5)

## 2016-01-27 LAB — HCG, SERUM, QUALITATIVE: PREG SERUM: NEGATIVE

## 2016-01-27 NOTE — Pre-Procedure Instructions (Signed)
CXR and EKG in EPIC Hgb A1C in EPIC

## 2016-01-27 NOTE — Patient Instructions (Signed)
Nicole Bryant  01/27/2016   Your procedure is scheduled on:02/04/16  Report to [place]  Entrance take [facility]  elevators to 3rd floor to  [place] at  7:55 AM.  Call this number if you have problems the morning of surgery [phone]   Remember: ONLY 1 PERSON MAY GO WITH YOU TO SHORT STAY TO GET  READY MORNING OF [place].  Do not eat food or drink liquids :After Midnight.     Take these medicines the morning of surgery with A SIP OF WATER: Loratidine, Reglan, Omeprazole, Flonase                                You may not have any metal on your body including hair pins and              piercings  Do not wear jewelry, make-up, lotions, powders or perfumes, deodorant             Do not wear nail polish.  Do not shave  48 hours prior to surgery.                 Do not bring valuables to the hospital. [place].  Contacts, dentures or bridgework may not be worn into surgery.      Patients discharged the day of surgery will not be allowed to drive home.  Name and phone number of your driver:Nicole [phone]              _____________________________________________________________________             [facility] - Preparing for Surgery Before surgery, you can play an important role.  Because skin is not sterile, your skin needs to be as free of germs as possible.  You can reduce the number of germs on your skin by washing with CHG (chlorahexidine gluconate) soap before surgery.  CHG is an antiseptic cleaner which kills germs and bonds with the skin to continue killing germs even after washing. Please DO NOT use if you have an allergy to CHG or antibacterial soaps.  If your skin becomes reddened/irritated stop using the CHG and inform your nurse when you arrive at Short Stay. Do not shave (including legs and underarms) for at least 48 hours prior to the first CHG shower.   You may shave your face/neck. Please follow these instructions carefully:  1.  Shower with CHG Soap the night before surgery and the  morning of Surgery.  2.  If you choose to wash your hair, wash your hair first as usual with your  normal  shampoo.  3.  After you shampoo, rinse your hair and body thoroughly to remove the  shampoo.                           4.  Use CHG as you would any other liquid soap.  You can apply chg directly  to the skin and wash                       Gently with a scrungie or clean washcloth.  5.  Apply the CHG Soap to your body  ONLY FROM THE NECK DOWN.   Do not use on face/ open                           Wound or open sores. Avoid contact with eyes, ears mouth and genitals (private parts).                       Wash face,  Genitals (private parts) with your normal soap.             6.  Wash thoroughly, paying special attention to the area where your surgery  will be performed.  7.  Thoroughly rinse your body with warm water from the neck down.  8.  DO NOT shower/wash with your normal soap after using and rinsing off  the CHG Soap.                9.  Pat yourself dry with a clean towel.            10.  Wear clean pajamas.            11.  Place clean sheets on your bed the night of your first shower and do not  sleep with pets. Day of Surgery : Do not apply any lotions/deodorants the morning of surgery.  Please wear clean clothes to the hospital/surgery center.  FAILURE TO FOLLOW THESE INSTRUCTIONS MAY RESULT IN THE CANCELLATION OF YOUR SURGERY PATIENT SIGNATURE_________________________________  NURSE SIGNATURE__________________________________  ________________________________________________________________________

## 2016-02-01 ENCOUNTER — Ambulatory Visit: Payer: BLUE CROSS/BLUE SHIELD | Admitting: Internal Medicine

## 2016-02-04 ENCOUNTER — Encounter (HOSPITAL_COMMUNITY)
Admission: RE | Disposition: A | Discharge status: Home / Self Care | Payer: Self-pay | Source: Ambulatory Visit | Attending: Surgery

## 2016-02-04 ENCOUNTER — Ambulatory Visit (HOSPITAL_COMMUNITY): Payer: BLUE CROSS/BLUE SHIELD | Admitting: Certified Registered Nurse Anesthetist

## 2016-02-04 ENCOUNTER — Ambulatory Visit (HOSPITAL_COMMUNITY): Payer: BLUE CROSS/BLUE SHIELD

## 2016-02-04 ENCOUNTER — Ambulatory Visit (HOSPITAL_COMMUNITY)
Admission: RE | Admit: 2016-02-04 | Discharge: 2016-02-04 | Disposition: A | Discharge status: Home / Self Care | Payer: BLUE CROSS/BLUE SHIELD | Source: Ambulatory Visit | Attending: Surgery | Admitting: Surgery

## 2016-02-04 ENCOUNTER — Encounter (HOSPITAL_COMMUNITY): Payer: Self-pay | Admitting: *Deleted

## 2016-02-04 DIAGNOSIS — E669 Obesity, unspecified: Secondary | ICD-10-CM | POA: Diagnosis not present

## 2016-02-04 DIAGNOSIS — R030 Elevated blood-pressure reading, without diagnosis of hypertension: Secondary | ICD-10-CM | POA: Insufficient documentation

## 2016-02-04 DIAGNOSIS — K8050 Calculus of bile duct without cholangitis or cholecystitis without obstruction: Secondary | ICD-10-CM | POA: Diagnosis not present

## 2016-02-04 DIAGNOSIS — K219 Gastro-esophageal reflux disease without esophagitis: Secondary | ICD-10-CM | POA: Diagnosis not present

## 2016-02-04 DIAGNOSIS — E119 Type 2 diabetes mellitus without complications: Secondary | ICD-10-CM | POA: Diagnosis not present

## 2016-02-04 DIAGNOSIS — K811 Chronic cholecystitis: Secondary | ICD-10-CM | POA: Insufficient documentation

## 2016-02-04 DIAGNOSIS — Z79899 Other long term (current) drug therapy: Secondary | ICD-10-CM | POA: Insufficient documentation

## 2016-02-04 DIAGNOSIS — Z791 Long term (current) use of non-steroidal anti-inflammatories (NSAID): Secondary | ICD-10-CM | POA: Diagnosis not present

## 2016-02-04 DIAGNOSIS — Z6833 Body mass index (BMI) 33.0-33.9, adult: Secondary | ICD-10-CM | POA: Diagnosis not present

## 2016-02-04 DIAGNOSIS — Z7951 Long term (current) use of inhaled steroids: Secondary | ICD-10-CM | POA: Insufficient documentation

## 2016-02-04 DIAGNOSIS — Z419 Encounter for procedure for purposes other than remedying health state, unspecified: Secondary | ICD-10-CM

## 2016-02-04 DIAGNOSIS — K8020 Calculus of gallbladder without cholecystitis without obstruction: Secondary | ICD-10-CM | POA: Diagnosis not present

## 2016-02-04 HISTORY — PX: LAPAROSCOPIC CHOLECYSTECTOMY SINGLE SITE WITH INTRAOPERATIVE CHOLANGIOGRAM: SHX6538

## 2016-02-04 HISTORY — DX: Chronic cholecystitis: K81.1

## 2016-02-04 SURGERY — LAPAROSCOPIC CHOLECYSTECTOMY SINGLE SITE WITH INTRAOPERATIVE CHOLANGIOGRAM
Anesthesia: General | Site: Abdomen

## 2016-02-04 MED ORDER — BUPIVACAINE-EPINEPHRINE 0.5% -1:200000 IJ SOLN
INTRAMUSCULAR | Status: DC | PRN
  Administered 2016-02-04: 30 mL

## 2016-02-04 MED ORDER — DEXAMETHASONE SODIUM PHOSPHATE 10 MG/ML IJ SOLN
INTRAMUSCULAR | Status: AC
  Filled 2016-02-04: qty 1

## 2016-02-04 MED ORDER — ONDANSETRON HCL 4 MG/2ML IJ SOLN
INTRAMUSCULAR | Status: DC | PRN
  Administered 2016-02-04: 4 mg via INTRAVENOUS

## 2016-02-04 MED ORDER — IOPAMIDOL (ISOVUE-300) INJECTION 61%
INTRAVENOUS | Status: AC
  Filled 2016-02-04: qty 50

## 2016-02-04 MED ORDER — ROCURONIUM BROMIDE 10 MG/ML (PF) SYRINGE
PREFILLED_SYRINGE | INTRAVENOUS | Status: AC
  Filled 2016-02-04: qty 10

## 2016-02-04 MED ORDER — IOHEXOL 300 MG/ML  SOLN
INTRAMUSCULAR | Status: DC | PRN
  Administered 2016-02-04: 10 mL

## 2016-02-04 MED ORDER — BUPIVACAINE-EPINEPHRINE (PF) 0.5% -1:200000 IJ SOLN
INTRAMUSCULAR | Status: AC
  Filled 2016-02-04: qty 60

## 2016-02-04 MED ORDER — LACTATED RINGERS IV SOLN
INTRAVENOUS | Status: DC
  Administered 2016-02-04: 1000 mL via INTRAVENOUS

## 2016-02-04 MED ORDER — EPHEDRINE SULFATE 50 MG/ML IJ SOLN
INTRAMUSCULAR | Status: DC | PRN
  Administered 2016-02-04: 10 mg via INTRAVENOUS

## 2016-02-04 MED ORDER — LIDOCAINE 2% (20 MG/ML) 5 ML SYRINGE
INTRAMUSCULAR | Status: AC
  Filled 2016-02-04: qty 5

## 2016-02-04 MED ORDER — MIDAZOLAM HCL 2 MG/2ML IJ SOLN
INTRAMUSCULAR | Status: AC
  Filled 2016-02-04: qty 2

## 2016-02-04 MED ORDER — TRAMADOL HCL 50 MG PO TABS
50.0000 mg | ORAL_TABLET | Freq: Once | ORAL | Status: AC
  Administered 2016-02-04: 50 mg via ORAL
  Filled 2016-02-04: qty 1

## 2016-02-04 MED ORDER — BUPIVACAINE LIPOSOME 1.3 % IJ SUSP
20.0000 mL | Freq: Once | INTRAMUSCULAR | Status: DC
  Filled 2016-02-04: qty 20

## 2016-02-04 MED ORDER — PROPOFOL 10 MG/ML IV BOLUS
INTRAVENOUS | Status: DC | PRN
  Administered 2016-02-04: 180 mg via INTRAVENOUS

## 2016-02-04 MED ORDER — 0.9 % SODIUM CHLORIDE (POUR BTL) OPTIME
TOPICAL | Status: DC | PRN
  Administered 2016-02-04: 1000 mL

## 2016-02-04 MED ORDER — ONDANSETRON HCL 4 MG/2ML IJ SOLN
INTRAMUSCULAR | Status: AC
  Filled 2016-02-04: qty 2

## 2016-02-04 MED ORDER — LACTATED RINGERS IR SOLN
Status: DC | PRN
  Administered 2016-02-04: 1000 mL

## 2016-02-04 MED ORDER — SODIUM CHLORIDE 0.9 % IJ SOLN
INTRAMUSCULAR | Status: AC
  Filled 2016-02-04: qty 50

## 2016-02-04 MED ORDER — PROMETHAZINE HCL 25 MG/ML IJ SOLN
6.2500 mg | INTRAMUSCULAR | Status: DC | PRN
  Administered 2016-02-04: 6.25 mg via INTRAVENOUS
  Filled 2016-02-04: qty 1

## 2016-02-04 MED ORDER — CELECOXIB 200 MG PO CAPS
400.0000 mg | ORAL_CAPSULE | ORAL | Status: AC
  Administered 2016-02-04: 400 mg via ORAL
  Filled 2016-02-04: qty 2

## 2016-02-04 MED ORDER — DEXAMETHASONE SODIUM PHOSPHATE 10 MG/ML IJ SOLN
INTRAMUSCULAR | Status: DC | PRN
  Administered 2016-02-04: 10 mg via INTRAVENOUS

## 2016-02-04 MED ORDER — GLYCOPYRROLATE 0.2 MG/ML IV SOSY
PREFILLED_SYRINGE | INTRAVENOUS | Status: AC
  Filled 2016-02-04: qty 3

## 2016-02-04 MED ORDER — GABAPENTIN 300 MG PO CAPS
300.0000 mg | ORAL_CAPSULE | ORAL | Status: AC
  Administered 2016-02-04: 300 mg via ORAL
  Filled 2016-02-04: qty 1

## 2016-02-04 MED ORDER — MEPERIDINE HCL 50 MG/ML IJ SOLN: 6.2500 mg | INTRAMUSCULAR | Status: DC | PRN

## 2016-02-04 MED ORDER — ACETAMINOPHEN 500 MG PO TABS
1000.0000 mg | ORAL_TABLET | ORAL | Status: AC
  Administered 2016-02-04: 1000 mg via ORAL
  Filled 2016-02-04: qty 2

## 2016-02-04 MED ORDER — GLYCOPYRROLATE 0.2 MG/ML IJ SOLN
INTRAMUSCULAR | Status: DC | PRN
  Administered 2016-02-04: .1 mg via INTRAVENOUS

## 2016-02-04 MED ORDER — LIDOCAINE HCL (CARDIAC) 20 MG/ML IV SOLN
INTRAVENOUS | Status: DC | PRN
  Administered 2016-02-04: 80 mg via INTRAVENOUS

## 2016-02-04 MED ORDER — HYDROMORPHONE HCL 1 MG/ML IJ SOLN: 0.2500 mg | INTRAMUSCULAR | Status: DC | PRN

## 2016-02-04 MED ORDER — ROCURONIUM BROMIDE 100 MG/10ML IV SOLN
INTRAVENOUS | Status: DC | PRN
  Administered 2016-02-04: 35 mg via INTRAVENOUS
  Administered 2016-02-04: 20 mg via INTRAVENOUS
  Administered 2016-02-04: 5 mg via INTRAVENOUS

## 2016-02-04 MED ORDER — FENTANYL CITRATE (PF) 250 MCG/5ML IJ SOLN
INTRAMUSCULAR | Status: AC
  Filled 2016-02-04: qty 5

## 2016-02-04 MED ORDER — SUGAMMADEX SODIUM 200 MG/2ML IV SOLN
INTRAVENOUS | Status: DC | PRN
  Administered 2016-02-04: 400 mg via INTRAVENOUS

## 2016-02-04 MED ORDER — FENTANYL CITRATE (PF) 100 MCG/2ML IJ SOLN
INTRAMUSCULAR | Status: DC | PRN
  Administered 2016-02-04 (×5): 50 ug via INTRAVENOUS

## 2016-02-04 MED ORDER — MIDAZOLAM HCL 2 MG/2ML IJ SOLN: 0.5000 mg | Freq: Once | INTRAMUSCULAR | Status: DC | PRN

## 2016-02-04 MED ORDER — PROPOFOL 10 MG/ML IV BOLUS
INTRAVENOUS | Status: AC
  Filled 2016-02-04: qty 20

## 2016-02-04 MED ORDER — TRAMADOL HCL 50 MG PO TABS: 50.0000 mg | ORAL_TABLET | Freq: Four times a day (QID) | ORAL | 0 refills | Status: DC | PRN

## 2016-02-04 MED ORDER — SUGAMMADEX SODIUM 500 MG/5ML IV SOLN
INTRAVENOUS | Status: AC
  Filled 2016-02-04: qty 5

## 2016-02-04 MED ORDER — MIDAZOLAM HCL 5 MG/5ML IJ SOLN
INTRAMUSCULAR | Status: DC | PRN
  Administered 2016-02-04: 2 mg via INTRAVENOUS

## 2016-02-04 MED ORDER — SUCCINYLCHOLINE CHLORIDE 20 MG/ML IJ SOLN
INTRAMUSCULAR | Status: DC | PRN
  Administered 2016-02-04: 100 mg via INTRAVENOUS

## 2016-02-04 SURGICAL SUPPLY — 40 items
APPLIER CLIP 5 13 M/L LIGAMAX5 (MISCELLANEOUS) ×2
CABLE HIGH FREQUENCY MONO STRZ (ELECTRODE) ×2 IMPLANT
CHLORAPREP W/TINT 26ML (MISCELLANEOUS) ×2 IMPLANT
CLIP APPLIE 5 13 M/L LIGAMAX5 (MISCELLANEOUS) ×1 IMPLANT
COVER MAYO STAND STRL (DRAPES) ×2 IMPLANT
COVER SURGICAL LIGHT HANDLE (MISCELLANEOUS) ×2 IMPLANT
DECANTER SPIKE VIAL GLASS SM (MISCELLANEOUS) ×2 IMPLANT
DRAIN CHANNEL 19F RND (DRAIN) IMPLANT
DRAPE C-ARM 42X120 X-RAY (DRAPES) ×2 IMPLANT
DRAPE WARM FLUID 44X44 (DRAPE) ×2 IMPLANT
DRSG TEGADERM 2-3/8X2-3/4 SM (GAUZE/BANDAGES/DRESSINGS) ×2 IMPLANT
DRSG TEGADERM 4X4.75 (GAUZE/BANDAGES/DRESSINGS) ×2 IMPLANT
ELECT REM PT RETURN 9FT ADLT (ELECTROSURGICAL) ×2
ELECTRODE REM PT RTRN 9FT ADLT (ELECTROSURGICAL) ×1 IMPLANT
ENDOLOOP SUT PDS II  0 18 (SUTURE)
ENDOLOOP SUT PDS II 0 18 (SUTURE) IMPLANT
EVACUATOR SILICONE 100CC (DRAIN) IMPLANT
GAUZE SPONGE 2X2 8PLY STRL LF (GAUZE/BANDAGES/DRESSINGS) ×1 IMPLANT
GLOVE ECLIPSE 8.0 STRL XLNG CF (GLOVE) ×2 IMPLANT
GLOVE INDICATOR 8.0 STRL GRN (GLOVE) ×10 IMPLANT
GOWN STRL REUS W/TWL XL LVL3 (GOWN DISPOSABLE) ×6 IMPLANT
IRRIG SUCT STRYKERFLOW 2 WTIP (MISCELLANEOUS) ×2
IRRIGATION SUCT STRKRFLW 2 WTP (MISCELLANEOUS) ×1 IMPLANT
KIT BASIN OR (CUSTOM PROCEDURE TRAY) ×2 IMPLANT
PAD POSITIONING PINK XL (MISCELLANEOUS) ×2 IMPLANT
POSITIONER SURGICAL ARM (MISCELLANEOUS) IMPLANT
POUCH SPECIMEN RETRIEVAL 10MM (ENDOMECHANICALS) IMPLANT
SCISSORS LAP 5X35 DISP (ENDOMECHANICALS) ×2 IMPLANT
SET CHOLANGIOGRAPH MIX (MISCELLANEOUS) ×2 IMPLANT
SHEARS HARMONIC ACE PLUS 36CM (ENDOMECHANICALS) ×2 IMPLANT
SPONGE GAUZE 2X2 STER 10/PKG (GAUZE/BANDAGES/DRESSINGS) ×1
SUT MNCRL AB 4-0 PS2 18 (SUTURE) ×2 IMPLANT
SUT PDS AB 1 CT1 27 (SUTURE) ×4 IMPLANT
SYR 20CC LL (SYRINGE) ×2 IMPLANT
TOWEL OR 17X26 10 PK STRL BLUE (TOWEL DISPOSABLE) ×2 IMPLANT
TOWEL OR NON WOVEN STRL DISP B (DISPOSABLE) ×2 IMPLANT
TRAY LAPAROSCOPIC (CUSTOM PROCEDURE TRAY) ×2 IMPLANT
TROCAR BLADELESS OPT 5 100 (ENDOMECHANICALS) ×2 IMPLANT
TROCAR BLADELESS OPT 5 150 (ENDOMECHANICALS) ×2 IMPLANT
TUBING INSUF HEATED (TUBING) ×2 IMPLANT

## 2016-02-04 NOTE — Anesthesia Postprocedure Evaluation (Signed)
Anesthesia Post Note  Patient: AESHA MIESSE  Procedure(s) Performed: Procedure(s) (LRB): LAPAROSCOPIC CHOLECYSTECTOMY SINGLE SITE WITH INTRAOPERATIVE CHOLANGIOGRAM AND STUDY OF BILE DUCTS (N/A)  Patient location during evaluation: PACU Anesthesia Type: General Level of consciousness: awake and alert, oriented and patient cooperative Pain management: pain level controlled Vital Signs Assessment: post-procedure vital signs reviewed and stable Respiratory status: spontaneous breathing, nonlabored ventilation and respiratory function stable Cardiovascular status: blood pressure returned to baseline and stable Postop Assessment: no signs of nausea or vomiting Anesthetic complications: no    Last Vitals:  Vitals:   02/04/16 1156 02/04/16 1405  BP: 131/78 (!) 135/94  Pulse: 93 (!) 111  Resp: 16 18  Temp: 36.5 C 36.8 C    Last Pain:  Vitals:   02/04/16 1405  TempSrc: Oral  PainSc:                  Kailen Hinkle,E. Marquay Kruse

## 2016-02-04 NOTE — Interval H&P Note (Signed)
History and Physical Interval Note:  02/04/2016 9:11 AM  Nicole Bryant  has presented today for surgery, with the diagnosis of Symptomatic biliary colic, probable chronic cholecystitis  The various methods of treatment have been discussed with the patient and family. After consideration of risks, benefits and other options for treatment, the patient has consented to  Procedure(s): LAPAROSCOPIC CHOLECYSTECTOMY SINGLE SITE WITH INTRAOPERATIVE CHOLANGIOGRAM AND STUDY OF BILE DUCTS (N/A) as a surgical intervention .  The patient's history has been reviewed, patient examined, no change in status, stable for surgery.  I have reviewed the patient's chart and labs.  Questions were answered to the patient's satisfaction.     Jamae Tison C.

## 2016-02-04 NOTE — Anesthesia Procedure Notes (Signed)
Procedure Name: Intubation Date/Time: 02/04/2016 9:50 AM Performed by: Maxwell Caul Pre-anesthesia Checklist: Patient identified, Emergency Drugs available, Suction available and Patient being monitored Patient Re-evaluated:Patient Re-evaluated prior to inductionOxygen Delivery Method: Circle system utilized Preoxygenation: Pre-oxygenation with 100% oxygen Intubation Type: IV induction Ventilation: Mask ventilation without difficulty Laryngoscope Size: Miller and 2 Grade View: Grade I Tube type: Oral Tube size: 7.0 mm Number of attempts: 1 Airway Equipment and Method: Stylet Placement Confirmation: ETT inserted through vocal cords under direct vision,  positive ETCO2 and breath sounds checked- equal and bilateral Secured at: 21 cm Tube secured with: Tape Dental Injury: Teeth and Oropharynx as per pre-operative assessment and Injury to lip  Comments: Small lip laceration to upper left lip. Sharyn Dross, SRNA

## 2016-02-04 NOTE — H&P (Signed)
Nicole Bryant. Lipton  Location: [facility] Patient #: [id number] DOB: 01-19-1971 Single / Language: Cleophus Molt / Race: White Female   History of Present Illness  The patient is a 45 year old female who presents with non-malignant abdominal pain. Note for "Non-malignant abdominal pain": Patient sent for surgical consultation by her gastroenterologist given concerns of postprandial right upper quadrant pain with reproduction of symptoms with cholecystokinin injection on HIDA scan.  Pleasant woman. Comes today with a close friend. She's been struggling with intermittent abdominal pains for the past 6 months. She did have a flare of some upper abdominal pain nausea vomiting and diarrhea. Was diagnosed with a neurovirus. Chronic heartburn and reflux. She's been on proton pump inhibitor for over a year.  More recently she had some pain it's more focused in the right upper quadrant. Usually its stimulated after she eats. Sometimes she can feel nauseated if she has an empty stomach. Occasionally burping and belching. Her and asked medications were double. He went upper and lower endoscopy which were mostly underwhelming. Mild irritation stomach. Some adenomatous polyps. No major abnormality. CT scan and ultrasound showed no gallstones or cholecystitis. Crushable fatty change in the liver but no major changes in her LFTs. This rarely drink alcohol. Half hour to an hour without difficulty. She does not smoke. Usually moves her bowels every day to every other day. She had a tubal ligation but no other abdominal surgery. She had gastric emptying study given her history of diabetes. Normal. Was started on some Reglan anyway. No real improvement despite going up to 4 pills. She wondered if the metoclopramide did actually help for a day or 2 but not consistent and certainly not helping now. She did get a HIDA scan. Gallbladder ejection fraction was elevated. She got sharp  worsening of her right upper quadrant pain after given the cholecystokinin. Lasted for several days as well. Another attack. Based on those concerns, surgical consultation requested since no other etiology for her pain can be explained  No personal nor family history of GI/colon cancer, inflammatory bowel disease, irritable bowel syndrome, allergy such as Celiac Sprue, dietary/dairy problems, colitis, ulcers nor gastritis. No recent sick contacts/gastroenteritis. No travel outside the country. No changes in diet. No dysphagia to solids or liquids. No significant heartburn or reflux. No hematochezia, hematemesis, coffee ground emesis. No evidence of prior gastric/peptic ulceration.              Diagnosis 1. Surgical [P], gastric antrum and gastric body - SLIGHT CHRONIC INFLAMMATION WITH HYPEREMIA. - WARTHIN-STARRY STAIN NEGATIVE FOR HELICOBACTER PYLORI. - NO INTESTINAL METAPLASIA, DYSPLASIA, OR MALIGNANCY. 2. Surgical [P], gastric polyp - BENIGN FUNDIC GLAND POLYPS. - WARTHIN-STARRY STAIN NEGATIVE FOR HELICOBACTER PYLORI. - NO ADENOMATOUS CHANGE, DYSPLASIA, OR MALIGNANCY. 3. Surgical [P], descending, sigmoid, polyp (2) - TUBULAR ADENOMA. - NO HIGH GRADE DYSPLASIA OR MALIGNANCY. Claudette Laws MD Pathologist, Electronic Signature (Case signed 09/21/2015)   Other Problems Marjean Donna, [place]; 12/23/2015 4:13 PM) Diabetes Mellitus Gastroesophageal Reflux Disease High blood pressure Hypercholesterolemia Thyroid Disease  Past Surgical History Marjean Donna, CMA; 12/23/2015 4:13 PM) Breast Biopsy Left. Colon Polyp Removal - Colonoscopy Thyroid Surgery  Diagnostic Studies History Marjean Donna, CMA; 12/23/2015 4:13 PM) Colonoscopy within last year Mammogram >3 years ago Pap Smear 1-5 years ago  Allergies Marjean Donna, CMA; 12/23/2015 4:14 PM) Codeine Phosphate *ANALGESICS - OPIOID*  Medication History (Sonya Bynum, CMA; 12/23/2015 4:15 PM) Omeprazole (10MG  Capsule  DR, Oral) Active. Loratadine (10MG  Capsule, Oral) Active. Metoclopramide HCl (10MG  Tablet, Oral)  Active. Flonase Allergy Relief (50MCG/ACT Suspension, Nasal) Active. Medications Reconciled  Social History Marjean Donna, CMA; 12/23/2015 4:13 PM) Alcohol use Occasional alcohol use. Caffeine use Carbonated beverages, Tea. No drug use Tobacco use Never smoker.  Family History Marjean Donna, CMA; 12/23/2015 4:13 PM) Arthritis Father. Cancer Family Members In General. Cerebrovascular Accident Father. Colon Polyps Family Members In General, Mother. Diabetes Mellitus Family Members In General. Heart Disease Family Members In General. Heart disease in female family member before age 80 Hypertension Family Members In General. Melanoma Family Members In General. Migraine Headache Father.    Review of Systems ([place]; 12/23/2015 4:13 PM) General Present- Appetite Loss and Weight Loss. Not Present- Chills, Fatigue, Fever, Night Sweats and Weight Gain. Skin Not Present- Change in Wart/Mole, Dryness, Hives, Jaundice, New Lesions, Non-Healing Wounds, Rash and Ulcer. HEENT Present- Nose Bleed, Seasonal Allergies and Wears glasses/contact lenses. Not Present- Earache, Hearing Loss, Hoarseness, Oral Ulcers, Ringing in the Ears, Sinus Pain, Sore Throat, Visual Disturbances and Yellow Eyes. Respiratory Present- Snoring. Not Present- Bloody sputum, Chronic Cough, Difficulty Breathing and Wheezing. Breast Not Present- Breast Mass, Breast Pain, Nipple Discharge and Skin Changes. Cardiovascular Not Present- Chest Pain, Difficulty Breathing Lying Down, Leg Cramps, Palpitations, Rapid Heart Rate, Shortness of Breath and Swelling of Extremities. Gastrointestinal Present- Abdominal Pain, Bloating, Constipation, Gets full quickly at meals, Indigestion and Nausea. Not Present- Bloody Stool, Change in Bowel Habits, Chronic diarrhea, Difficulty Swallowing, Excessive gas, Hemorrhoids, Rectal Pain and  Vomiting. Female Genitourinary Not Present- Frequency, Nocturia, Painful Urination, Pelvic Pain and Urgency. Musculoskeletal Present- Joint Stiffness. Not Present- Back Pain, Joint Pain, Muscle Pain, Muscle Weakness and Swelling of Extremities. Neurological Not Present- Decreased Memory, Fainting, Headaches, Numbness, Seizures, Tingling, Tremor, Trouble walking and Weakness. Psychiatric Not Present- Anxiety, Bipolar, Change in Sleep Pattern, Depression, Fearful and Frequent crying. Endocrine Present- Cold Intolerance and New Diabetes. Not Present- Excessive Hunger, Hair Changes, Heat Intolerance and Hot flashes. Hematology Not Present- Blood Thinners, Easy Bruising, Excessive bleeding, Gland problems, HIV and Persistent Infections.  Vitals (Sonya Bynum CMA; 12/23/2015 4:14 PM) 12/23/2015 4:13 PM Weight: 200 lb Height: 64in Body Surface Area: 1.96 m Body Mass Index: 34.33 kg/m  Temp.: 70F(Temporal)  Pulse: 76 (Regular)  BP: 128/76 (Sitting, Left Arm, Standard)   BP (!) 160/99   Pulse (!) 109   Temp 98.5 F (36.9 C) (Oral)   Resp 18   LMP 01/23/2016 (Exact Date)   SpO2 100%      Physical Exam  General Mental Status-Alert. General Appearance-Not in acute distress, Not Sickly. Orientation-Oriented X3. Hydration-Well hydrated. Voice-Normal.  Integumentary Global Assessment Upon inspection and palpation of skin surfaces of the - Axillae: non-tender, no inflammation or ulceration, no drainage. and Distribution of scalp and body hair is normal. General Characteristics Temperature - normal warmth is noted.  Head and Neck Head-normocephalic, atraumatic with no lesions or palpable masses. Face Global Assessment - atraumatic, no absence of expression. Neck Global Assessment - no abnormal movements, no bruit auscultated on the right, no bruit auscultated on the left, no decreased range of motion, non-tender. Trachea-midline. Thyroid Gland Characteristics  - non-tender.  Eye Eyeball - Left-Extraocular movements intact, No Nystagmus. Eyeball - Right-Extraocular movements intact, No Nystagmus. Cornea - Left-No Hazy. Cornea - Right-No Hazy. Sclera/Conjunctiva - Left-No scleral icterus, No Discharge. Sclera/Conjunctiva - Right-No scleral icterus, No Discharge. Pupil - Left-Direct reaction to light normal. Pupil - Right-Direct reaction to light normal.  ENMT Ears Pinna - Left - no drainage observed, no generalized tenderness observed. Right -  no drainage observed, no generalized tenderness observed. Nose and Sinuses External Inspection of the Nose - no destructive lesion observed. Inspection of the nares - Left - quiet respiration. Right - quiet respiration. Mouth and Throat Lips - Upper Lip - no fissures observed, no pallor noted. Lower Lip - no fissures observed, no pallor noted. Nasopharynx - no discharge present. Oral Cavity/Oropharynx - Tongue - no dryness observed. Oral Mucosa - no cyanosis observed. Hypopharynx - no evidence of airway distress observed.  Chest and Lung Exam Inspection Movements - Normal and Symmetrical. Accessory muscles - No use of accessory muscles in breathing. Palpation Palpation of the chest reveals - Non-tender. Auscultation Breath sounds - Normal and Clear.  Cardiovascular Auscultation Rhythm - Regular. Murmurs & Other Heart Sounds - Auscultation of the heart reveals - No Murmurs and No Systolic Clicks.  Abdomen Inspection Inspection of the abdomen reveals - No Visible peristalsis and No Abnormal pulsations. Umbilicus - No Bleeding, No Urine drainage. Palpation/Percussion Palpation and Percussion of the abdomen reveal - Soft, Non Tender, No Rebound tenderness, No Rigidity (guarding) and No Cutaneous hyperesthesia. Note: Abdomen soft. Mild discomfort in right upper quadrant but no true Murphy sign. Rest of the abdomen is Nontender, nondistended. No guarding. No umbilical no other  hernias.   Female Genitourinary Sexual Maturity Tanner 5 - Adult hair pattern. Note: No vaginal bleeding nor discharge. No lymphadenopathy. No inguinal hernias.   Peripheral Vascular Upper Extremity Inspection - Left - No Cyanotic nailbeds, Not Ischemic. Right - No Cyanotic nailbeds, Not Ischemic.  Neurologic Neurologic evaluation reveals -normal attention span and ability to concentrate, able to name objects and repeat phrases. Appropriate fund of knowledge , normal sensation and normal coordination. Mental Status Affect - not angry, not paranoid. Cranial Nerves-Normal Bilaterally. Gait-Normal.  Neuropsychiatric Mental status exam performed with findings of-able to articulate well with normal speech/language, rate, volume and coherence, thought content normal with ability to perform basic computations and apply abstract reasoning and no evidence of hallucinations, delusions, obsessions or homicidal/suicidal ideation.  Musculoskeletal Global Assessment Spine, Ribs and Pelvis - no instability, subluxation or laxity. Right Upper Extremity - no instability, subluxation or laxity.  Lymphatic Head & Neck  General Head & Neck Lymphatics: Bilateral - Description - No Localized lymphadenopathy. Axillary  General Axillary Region: Bilateral - Description - No Localized lymphadenopathy. Femoral & Inguinal  Generalized Femoral & Inguinal Lymphatics: Left - Description - No Localized lymphadenopathy. Right - Description - No Localized lymphadenopathy.    Assessment & Plan  CHRONIC CHOLECYSTITIS WITHOUT CALCULUS (K81.1) Impression: History suspicious for episodes of biliary colic and postprandial right upper quadrant pain with reproduction of symptoms with cholecystokinin injection despite no gallstones and no gallbladder ejection fraction abnormality. She's had repeated endoscopies are otherwise underwhelming. The consistent with heartburn or reflux. She did have an episode of  probable neurovirus enterovirus 5 months ago that was more central in this is not like that.  Offered cholecystectomy. I cautioned that she is at risk for having persistent symptoms and this does not solve her problem, however workup is otherwise rather thorough with no other sources my objective data and improvement with other treatment regimens. She wishes to proceed with surgery  I have re-reviewed the the patient's records, history, medications, and allergies.  I have re-examined the patient.  I again discussed intraoperative plans and goals of post-operative recovery.  The patient agrees to proceed.   Current Plans You are being scheduled for surgery - Our schedulers will call you.  You should hear  from our office's scheduling department within 5 working days about the location, date, and time of surgery. We try to make accommodations for patient's preferences in scheduling surgery, but sometimes the OR schedule or the surgeon's schedule prevents [place] from making those accommodations.  If you have not heard from our office [phone]) in 5 working days, call the office and ask for your surgeon's nurse.  If you have other questions about your diagnosis, plan, or surgery, call the office and ask for your surgeon's nurse.  The anatomy & physiology of hepatobiliary & pancreatic function was discussed. The pathophysiology of gallbladder dysfunction was discussed. Natural history risks without surgery was discussed. I feel the risks of no intervention will lead to serious problems that outweigh the operative risks; therefore, I recommended cholecystectomy to remove the pathology. I explained laparoscopic techniques with possible need for an open approach. Probable cholangiogram to evaluate the bilary tract was explained as well.  Risks such as bleeding, infection, abscess, leak, injury to other organs, need for further treatment, heart attack, death, and other risks were discussed. I noted a good  likelihood this will help address the problem. Possibility that this will not correct all abdominal symptoms was explained. Goals of post-operative recovery were discussed as well. We will work to minimize complications. An educational handout further explaining the pathology and treatment options was given as well. Questions were answered. The patient expresses understanding & wishes to proceed with surgery.  Pt Education - Pamphlet Given - Laparoscopic Gallbladder Surgery: discussed with patient and provided information. Written instructions provided Pt Education - [facility] Laparosopic Post Op HCI (Gleen Ripberger) Pt Education - [facility] Good Bowel Health (Giancarlos Berendt) Pt Education - Laparoscopic Cholecystectomy: gallbladder  Adin Hector, M.D., F.A.C.S. Gastrointestinal and Minimally Invasive Surgery [facility] [address] [place], [place] [id number] [phone] Main / Paging

## 2016-02-04 NOTE — Anesthesia Preprocedure Evaluation (Addendum)
Anesthesia Evaluation  Patient identified by MRN, date of birth, ID band Patient awake    Reviewed: Allergy & Precautions, NPO status , Patient's Chart, lab work & pertinent test results  History of Anesthesia Complications Negative for: history of anesthetic complications  Airway Mallampati: I  TM Distance: >3 FB Neck ROM: Full    Dental  (+) Teeth Intact, Dental Advisory Given   Pulmonary neg pulmonary ROS,    breath sounds clear to auscultation       Cardiovascular negative cardio ROS   Rhythm:Regular Rate:Normal     Neuro/Psych    GI/Hepatic Neg liver ROS, GERD  Medicated and Controlled,  Endo/Other  diabetesMorbid obesity  Renal/GU negative Renal ROS     Musculoskeletal   Abdominal (+) + obese,   Peds  Hematology   Anesthesia Other Findings   Reproductive/Obstetrics 01/27/16 preg test: neg LMP 01/30/16                            Anesthesia Physical Anesthesia Plan  ASA: II  Anesthesia Plan: General   Post-op Pain Management:    Induction: Intravenous  Airway Management Planned: Oral ETT  Additional Equipment:   Intra-op Plan:   Post-operative Plan: Extubation in OR  Informed Consent: I have reviewed the patients History and Physical, chart, labs and discussed the procedure including the risks, benefits and alternatives for the proposed anesthesia with the patient or authorized representative who has indicated his/her understanding and acceptance.   Dental advisory given  Plan Discussed with: CRNA and Surgeon  Anesthesia Plan Comments: (Plan routine monitors, GETA)        Anesthesia Quick Evaluation

## 2016-02-04 NOTE — Discharge Instructions (Signed)
General Anesthesia, Adult, Care After Refer to this sheet in the next few weeks. These instructions provide you with information on caring for yourself after your procedure. Your health care provider may also give you more specific instructions. Your treatment has been planned according to current medical practices, but problems sometimes occur. Call your health care provider if you have any problems or questions after your procedure. WHAT TO EXPECT AFTER THE PROCEDURE After the procedure, it is typical to experience:  Sleepiness.  Nausea and vomiting. HOME CARE INSTRUCTIONS  For the first 24 hours after general anesthesia:  Have a responsible person with you.  Do not drive a car. If you are alone, do not take public transportation.  Do not drink alcohol.  Do not take medicine that has not been prescribed by your health care provider.  Do not sign important papers or make important decisions.  You may resume a normal diet and activities as directed by your health care provider.  Change bandages (dressings) as directed.  If you have questions or problems that seem related to general anesthesia, call the hospital and ask for the anesthetist or anesthesiologist on call. SEEK MEDICAL CARE IF:  You have nausea and vomiting that continue the day after anesthesia.  You develop a rash. SEEK IMMEDIATE MEDICAL CARE IF:   You have difficulty breathing.  You have chest pain.  You have any allergic problems.   This information is not intended to replace advice given to you by your health care provider. Make sure you discuss any questions you have with your health care provider.   Document Released: 08/08/2000 Document Revised: 05/23/2014 Document Reviewed: 08/31/2011 Elsevier Interactive Patient Education 2016 [place]: POST OP INSTRUCTIONS  ######################################################################  EAT Gradually transition to a high  fiber diet with a fiber supplement over the next few weeks after discharge.  Start with a pureed / full liquid diet (see below)  WALK Walk an hour a day.  Control your pain to do that.    CONTROL PAIN Control pain so that you can walk, sleep, tolerate sneezing/coughing, go up/down stairs.  HAVE A BOWEL MOVEMENT DAILY Keep your bowels regular to avoid problems.  OK to try a laxative to override constipation.  OK to use an antidairrheal to slow down diarrhea.  Call if not better after 2 tries  CALL IF YOU HAVE PROBLEMS/CONCERNS Call if you are still struggling despite following these instructions. Call if you have concerns not answered by these instructions  ######################################################################    1. DIET: Follow a light bland diet the first 24 hours after arrival home, such as soup, liquids, crackers, etc.  Be sure to include lots of fluids daily.  Avoid fast food or heavy meals as your are more likely to get nauseated.  Eat a low fat the next few days after surgery.   2. Take your usually prescribed home medications unless otherwise directed. 3. PAIN CONTROL: a. Pain is best controlled by a usual combination of three different methods TOGETHER: i. Ice/Heat ii. Over the counter pain medication iii. Prescription pain medication b. Most patients will experience some swelling and bruising around the incisions.  Ice packs or heating pads (30-60 minutes up to 6 times a day) will help. Use ice for the first few days to help decrease swelling and bruising, then switch to heat to help relax tight/sore spots and speed recovery.  Some people prefer to use ice alone, heat alone, alternating between ice & heat.  Experiment to what works for you.  Swelling and bruising can take several weeks to resolve.   c. It is helpful to take an over-the-counter pain medication regularly for the first few weeks.  Choose one of the following that works best for you: i. Naproxen  (Aleve, etc)  Two 220mg  tabs twice a day ii. Ibuprofen (Advil, etc) Three 200mg  tabs four times a day (every meal & bedtime) iii. Acetaminophen (Tylenol, etc) 500-650mg  four times a day (every meal & bedtime) d. A  prescription for pain medication (such as oxycodone, hydrocodone, etc) should be given to you upon discharge.  Take your pain medication as prescribed.  i. If you are having problems/concerns with the prescription medicine (does not control pain, nausea, vomiting, rash, itching, etc), please call us [phone] to see if we need to switch you to a different pain medicine that will work better for you and/or control your side effect better. ii. If you need a refill on your pain medication, please contact your pharmacy.  They will contact our office to request authorization. Prescriptions will not be filled after 5 pm or on week-ends. 4. Avoid getting constipated.  Between the surgery and the pain medications, it is common to experience some constipation.  Increasing fluid intake and taking a fiber supplement (such as Metamucil, Citrucel, FiberCon, MiraLax, etc) 1-2 times a day regularly will usually help prevent this problem from occurring.  A mild laxative (prune juice, Milk of Magnesia, MiraLax, etc) should be taken according to package directions if there are no bowel movements after 48 hours.   5. Watch out for diarrhea.  If you have many loose bowel movements, simplify your diet to bland foods & liquids for a few days.  Stop any stool softeners and decrease your fiber supplement.  Switching to mild anti-diarrheal medications (Kayopectate, Pepto Bismol) can help.  If this worsens or does not improve, please call us. 6. Wash / shower every day.  You may shower over the dressings as they are waterproof.  Continue to shower over incision(s) after the dressing is off. 7. Remove your waterproof bandages 5 days after surgery.  You may leave the incision open to air.  You may replace a  dressing/Band-Aid to cover the incision for comfort if you wish.  8. ACTIVITIES as tolerated:   a. You may resume regular (light) daily activities beginning the next day--such as daily self-care, walking, climbing stairs--gradually increasing activities as tolerated.  If you can walk 30 minutes without difficulty, it is safe to try more intense activity such as jogging, treadmill, bicycling, low-impact aerobics, swimming, etc. b. Save the most intensive and strenuous activity for last such as sit-ups, heavy lifting, contact sports, etc  Refrain from any heavy lifting or straining until you are off narcotics for pain control.   c. DO NOT PUSH THROUGH PAIN.  Let pain be your guide: If it hurts to do something, don't do it.  Pain is your body warning you to avoid that activity for another week until the pain goes down. d. You may drive when you are no longer taking prescription pain medication, you can comfortably wear a seatbelt, and you can safely maneuver your car and apply brakes. e. Dennis Bast may have sexual intercourse when it is comfortable.  9. FOLLOW UP in our office a. Please call [facility] at [phone] to set up an appointment to see your surgeon in the office for a follow-up appointment approximately 2-3 weeks after your surgery. b. Make sure  that you call for this appointment the day you arrive home to insure a convenient appointment time. 10. IF YOU HAVE DISABILITY OR FAMILY LEAVE FORMS, BRING THEM TO THE OFFICE FOR PROCESSING.  DO NOT GIVE THEM TO YOUR DOCTOR.   WHEN TO CALL us [phone]: 1. Poor pain control 2. Reactions / problems with new medications (rash/itching, nausea, etc)  3. Fever over 101.5 F (38.5 C) 4. Inability to urinate 5. Nausea and/or vomiting 6. Worsening swelling or bruising 7. Continued bleeding from incision. 8. Increased pain, redness, or drainage from the incision   The clinic staff is available to answer your questions during regular business hours  (8:30am-5pm).  Please dont hesitate to call and ask to speak to one of our nurses for clinical concerns.   If you have a medical emergency, go to the nearest emergency room or call 911.  A surgeon from [facility] is always on call at the [facility], [place], [place], [place], [place]  [address] ? MAIN: [phone] ? TOLL FREE: [phone] ?  FAX [phone] [email or web address]    Cholecystitis Cholecystitis is inflammation of the gallbladder. It is often called a gallbladder attack. The gallbladder is a pear-shaped organ that lies beneath the liver on the right side of the body. The gallbladder stores bile, which is a fluid that helps the body to digest fats. If bile builds up in your gallbladder, your gallbladder becomes inflamed. This condition may occur suddenly (be acute). Repeat episodes of acute cholecystitis or prolonged episodes may lead to a long-term (chronic) condition. Cholecystitis is serious and it requires treatment.  CAUSES The most common cause of this condition is gallstones. Gallstones can block the tube (duct) that carries bile out of your gallbladder. This causes bile to build up. Other causes of this condition include:  Damage to the gallbladder due to a decrease in blood flow.  Infections in the bile ducts.  Scars or kinks in the bile ducts.  Tumors in the liver, pancreas, or gallbladder. RISK FACTORS This condition is more likely to develop in:  People who have sickle cell disease.  People who take birth control pills or use estrogen.  People who have alcoholic liver disease.  People who have liver cirrhosis.  People who have their nutrition delivered through a vein (parenteral nutrition).  People who do not eat or drink (do fasting) for a long period of time.  People who are obese.  People who have rapid weight loss.  People who are pregnant.  People who have increased  triglyceride levels.  People who have pancreatitis. SYMPTOMS Symptoms of this condition include:  Abdominal pain, especially in the upper right area of the abdomen.  Abdominal tenderness or bloating.  Nausea.  Vomiting.  Fever.  Chills.  Yellowing of the skin and the whites of the eyes (jaundice). DIAGNOSIS This condition is diagnosed with a medical history and physical exam. You may also have other tests, including:  Imaging tests, such as:  An ultrasound of the gallbladder.  A CT scan of the abdomen.  A gallbladder nuclear scan (HIDA scan). This scan allows your health care provider to see the bile moving from your liver to your gallbladder and to your small intestine.  MRI.  Blood tests, such as:  A complete blood count, because the white blood cell count may be higher than normal.  Liver function tests, because some levels may be higher than normal with certain types  of gallstones. TREATMENT Treatment may include:  Fasting for a certain amount of time.  IV fluids.  Medicine to treat pain or vomiting.  Antibiotic medicine.  Surgery to remove your gallbladder (cholecystectomy). This may happen immediately or at a later time. [place] care will depend on your treatment. In general:  Take over-the-counter and prescription medicines only as told by your health care provider.  If you were prescribed an antibiotic medicine, take it as told by your health care provider. Do not stop taking the antibiotic even if you start to feel better.  Follow instructions from your health care provider about what to eat or drink. When you are allowed to eat, avoid eating or drinking anything that triggers your symptoms.  Keep all follow-up visits as told by your health care provider. This is important. SEEK MEDICAL CARE IF:  Your pain is not controlled with medicine.  You have a fever. SEEK IMMEDIATE MEDICAL CARE IF:  Your pain moves to another part  of your abdomen or to your back.  You continue to have symptoms or you develop new symptoms even with treatment.   This information is not intended to replace advice given to you by your health care provider. Make sure you discuss any questions you have with your health care provider.   Document Released: 05/02/2005 Document Revised: 01/21/2015 Document Reviewed: 08/13/2014 Elsevier Interactive Patient Education [employer].

## 2016-02-04 NOTE — Transfer of Care (Signed)
Immediate Anesthesia Transfer of Care Note  Patient: Nicole Bryant  Procedure(s) Performed: Procedure(s): LAPAROSCOPIC CHOLECYSTECTOMY SINGLE SITE WITH INTRAOPERATIVE CHOLANGIOGRAM AND STUDY OF BILE DUCTS (N/A)  Patient Location: PACU  Anesthesia Type:General  Level of Consciousness:  sedated, patient cooperative and responds to stimulation  Airway & Oxygen Therapy:Patient Spontanous Breathing and Patient connected to face mask oxgen  Post-op Assessment:  Report given to PACU RN and Post -op Vital signs reviewed and stable  Post vital signs:  Reviewed and stable  Last Vitals:  Vitals:   02/04/16 0735  BP: (!) 160/99  Pulse: (!) 109  Resp: 18  Temp: 123456 C    Complications: No apparent anesthesia complications

## 2016-02-04 NOTE — Op Note (Signed)
02/04/2016  11:03 AM  PATIENT:  Nicole Bryant  45 y.o. female  Patient Care Team: Leone Haven, MD as PCP - General (Family Medicine) Michael Boston, MD as Consulting Physician (General Surgery) Jerene Bears, MD as Consulting Physician (Gastroenterology)  PRE-OPERATIVE DIAGNOSIS:  Symptomatic biliary colic, probable chronic cholecystitis  POST-OPERATIVE DIAGNOSIS:  Symptomatic biliary colic, probable chronic cholecystitis  PROCEDURE:  Procedure(s): LAPAROSCOPIC CHOLECYSTECTOMY SINGLE SITE WITH INTRAOPERATIVE CHOLANGIOGRAM AND STUDY OF BILE DUCTS  SURGEON:  Surgeon(s): Michael Boston, MD  ASSISTANT: Olene Floss, PA-S, [employer]    ANESTHESIA:   local and general  EBL:  Total I/O In: -  Out: 10 [Blood:10]  Delay start of Pharmacological VTE agent (>24hrs) due to surgical blood loss or risk of bleeding:  no  DRAINS: none   SPECIMEN:  Source of Specimen:  Gallbladder   DISPOSITION OF SPECIMEN:  PATHOLOGY  COUNTS:  YES  PLAN OF CARE: Discharge to home after PACU  PATIENT DISPOSITION:  PACU - hemodynamically stable.  INDICATION: Patient with episodes of postprandial abdominal pain suspicious for chronic cholecystitis.  No gallstones but reproduction of symptoms on HIDA scan.  Rest of the differential diagnosis seems unlikely.  I offered cholecystectomy:  The anatomy & physiology of hepatobiliary & pancreatic function was discussed.  The pathophysiology of gallbladder dysfunction was discussed.  Natural history risks without surgery was discussed.   I feel the risks of no intervention will lead to serious problems that outweigh the operative risks; therefore, I recommended cholecystectomy to remove the pathology.  I explained laparoscopic techniques with possible need for an open approach.  Probable cholangiogram to evaluate the bilary tract was explained as well.    Risks such as bleeding, infection, abscess, leak, injury to other organs, need for  further treatment, heart attack, death, and other risks were discussed.  I noted a good likelihood this will help address the problem.  Possibility that this will not correct all abdominal symptoms was explained.  Goals of post-operative recovery were discussed as well.  We will work to minimize complications.  An educational handout further explaining the pathology and treatment options was given as well.  Questions were answered.  The patient expresses understanding & wishes to proceed with surgery.   OR FINDINGS: Mild gallbladder wall thickening with some atrophy.  Some adhesions of duodenal bulb and omentum to the gallbladder, suspicious for chronic cholecystitis.  No obvious stones.  Some fatty change on the liver.  Cleaned gram shows classic biliary anatomy  DESCRIPTION:   The patient was identified & brought in the operating room. The patient was positioned supine with arms tucked. SCDs were active during the entire case. The patient underwent general anesthesia without any difficulty.  The abdomen was prepped and draped in a sterile fashion. A Surgical Timeout confirmed our plan.  I made a transverse curvilinear incision through the superior umbilical fold.  I placed a 29mm long port through the supraumbilical fascia using a modified Hassan cutdown technique. I began carbon dioxide insufflation. Camera inspection revealed no injury. There were no adhesions to the anterior abdominal wall supraumbilically.  I proceeded to continue with single site technique. I placed a #5 port in left upper aspect of the wound. I placed a 5 mm atraumatic grasper in the right inferior aspect of the wound.  I turned attention to the right upper quadrant.  There is adhesions of the duodenal bulb and omentum to the gallbladder.  These were carefully freed off.  Liver had some obvious  fatty change in the liver but no strong evidence of cirrhosis.  I held off on doing any liver biopsies.  The gallbladder fundus was  elevated cephalad. I freed the peritoneal coverings between the gallbladder and the liver on the posteriolateral and anteriomedial walls. I alternated between Harmonic & blunt Maryland dissection to help get a good critical view of the cystic artery and cystic duct. I did further dissection to free a few centimeters of the  gallbladder off the liver bed to get a good critical view of the infundibulum and cystic duct. I mobilized the cystic artery; and, after getting a good 360 view, ligated the cystic artery using the Harmonic ultrasonic dissection. I skeletonized the cystic duct.  I placed a clip on the infundibulum. I did a partial cystic duct-otomy and ensured patency. I placed a 5 [place] cholangiocatheter through a puncture site at the right subcostal ridge of the abdominal wall and directed it into the cystic duct.  We ran a cholangiogram with dilute radio-opaque contrast and continuous fluoroscopy.  Contrast flowed from a side branch consistent with cystic duct cannulization. Contrast flowed up the common hepatic duct into the right and left intrahepatic chains out to secondary radicals. Contrast flowed down the common bile duct easily across the normal ampulla into the duodenum.  This was consistent with a normal cholangiogram.  I removed the cholangiocatheter. I placed clips on the cystic duct x4.  I completed cystic duct transection. I freed the gallbladder from its remaining attachments to the liver. I ensured hemostasis on the gallbladder fossa of the liver and elsewhere. I inspected the rest of the abdomen & detected no injury nor bleeding elsewhere.  I removed the gallbladder out the supraumbilical fascia. I closed the fascia transversely using #1 PDS interrupted stitches. I closed the skin using 4-0 monocryl stitch.  Sterile dressing was applied. The patient was extubated & arrived in the PACU in stable condition..  I had discussed postoperative care with the patient in the holding area.  I  discussed operative findings, updated the patient's status, discussed probable steps to recovery, and gave postoperative recommendations to the patient's family.  Recommendations were made.  Questions were answered.  They expressed understanding & appreciation.  Instructions are written in the chart as well.  Adin Hector, M.D., F.A.C.S. Gastrointestinal and Minimally Invasive Surgery [facility] [address], [place], [place] [address] [phone] Main / Paging

## 2016-02-29 ENCOUNTER — Encounter: Payer: Self-pay | Admitting: *Deleted

## 2016-03-21 ENCOUNTER — Ambulatory Visit: Payer: BLUE CROSS/BLUE SHIELD | Admitting: Internal Medicine

## 2016-05-02 ENCOUNTER — Other Ambulatory Visit: Payer: Self-pay | Admitting: Internal Medicine

## 2016-05-05 ENCOUNTER — Other Ambulatory Visit: Payer: Self-pay | Admitting: Internal Medicine

## 2016-07-13 ENCOUNTER — Other Ambulatory Visit: Payer: Self-pay | Admitting: Family Medicine

## 2016-08-16 ENCOUNTER — Other Ambulatory Visit: Payer: Self-pay | Admitting: Family Medicine

## 2016-08-16 NOTE — Telephone Encounter (Signed)
Last OV 12/29/15, looks like it was discontinued in the hospital?

## 2016-08-17 NOTE — Telephone Encounter (Signed)
It appears she was supposed to be on Prilosec. Please see what she has been taking. Thanks.

## 2016-08-18 MED ORDER — OMEPRAZOLE 40 MG PO CPDR: 40.0000 mg | DELAYED_RELEASE_CAPSULE | Freq: Two times a day (BID) | ORAL | 3 refills | Status: DC

## 2016-08-18 NOTE — Telephone Encounter (Signed)
Prilosec sent to pharmacy

## 2016-08-18 NOTE — Telephone Encounter (Signed)
Left message to return call 

## 2016-08-18 NOTE — Telephone Encounter (Signed)
Patient states she is on protonix but would rather go back to the prilosec

## 2016-11-04 ENCOUNTER — Other Ambulatory Visit: Payer: Self-pay | Admitting: Family Medicine

## 2017-04-18 ENCOUNTER — Other Ambulatory Visit: Payer: Self-pay | Admitting: Family Medicine

## 2017-06-12 ENCOUNTER — Encounter: Payer: Self-pay | Admitting: Family Medicine

## 2017-06-12 ENCOUNTER — Ambulatory Visit: Payer: BLUE CROSS/BLUE SHIELD | Admitting: Family Medicine

## 2017-06-12 VITALS — BP 138/88 | HR 96 | Temp 98.5°F | Wt 199.5 lb

## 2017-06-12 DIAGNOSIS — J069 Acute upper respiratory infection, unspecified: Secondary | ICD-10-CM | POA: Diagnosis not present

## 2017-06-12 DIAGNOSIS — B9789 Other viral agents as the cause of diseases classified elsewhere: Secondary | ICD-10-CM | POA: Diagnosis not present

## 2017-06-12 MED ORDER — BENZONATATE 100 MG PO CAPS: 100.0000 mg | ORAL_CAPSULE | Freq: Three times a day (TID) | ORAL | 0 refills | Status: DC | PRN

## 2017-06-12 MED ORDER — AMOXICILLIN 875 MG PO TABS: 875.0000 mg | ORAL_TABLET | Freq: Two times a day (BID) | ORAL | 0 refills | Status: DC

## 2017-06-12 NOTE — Progress Notes (Signed)
Subjective:    Patient ID: Nicole Bryant, female    DOB: Sep 29, 1970, 47 y.o.   MRN: [id number]  HPI This is a 47 yo female who presents today with cough and sore throat x 5 day. Coughed all night last night. Other than fatigue, feels a little better this morning. Feels SOB/tight, no audile wheeze. Green sputum. Little nasal drainage. Some headache over eyes. Pressure in ears. Subjective fever. Has been using albuterol inhaler twice a day with some relief. Non smoker. No known sick contacts.  Using Tylenol severe cold with relief of headache.   Past Medical History:  Diagnosis Date  . Allergic rhinitis   . Chickenpox   . Diabetes ([place])    currently no meds- trying to control with diet  . Fatty liver   . GERD (gastroesophageal reflux disease)   . Hyperlipemia   . Norovirus 07/2015  . Parathyroid abnormality ([place])   . Raynaud disease   . Tubular adenoma of colon   . UTI (lower urinary tract infection)    Past Surgical History:  Procedure Laterality Date  . BREAST CYST EXCISION    . LAPAROSCOPIC CHOLECYSTECTOMY SINGLE SITE WITH INTRAOPERATIVE CHOLANGIOGRAM N/A 02/04/2016   Procedure: LAPAROSCOPIC CHOLECYSTECTOMY SINGLE SITE WITH INTRAOPERATIVE CHOLANGIOGRAM AND STUDY OF BILE DUCTS;  Surgeon: Michael Boston, MD;  Location: [facility];  Service: General;  Laterality: N/A;  . PARATHYROIDECTOMY  2009  . TUBAL LIGATION  2008   Family History  Problem Relation Age of Onset  . Stroke Father   . Heart disease Maternal Grandfather   . Hypertension Paternal Aunt   . Diabetes Paternal Aunt   . Cancer Cousin        Peritoneal carcinoma  . Prostate cancer Unknown        great uncle  . Heart disease Paternal Grandmother   . Diverticulosis Mother   . GER disease Maternal Aunt   . Colon cancer Neg Hx    Social History   Tobacco Use  . Smoking status: Never Smoker  . Smokeless tobacco: Never Used  Substance Use Topics  . Alcohol use: No    Alcohol/week: 0.0 oz  . Drug use: No       Review of Systems Per HPI    Objective:   Physical Exam  Constitutional: She is oriented to person, place, and time. She appears well-developed and well-nourished. No distress.  HENT:  Head: Normocephalic and atraumatic.  Right Ear: Tympanic membrane, external ear and ear canal normal.  Left Ear: Tympanic membrane, external ear and ear canal normal.  Nose: Mucosal edema and rhinorrhea present.  Mouth/Throat: Uvula is midline, oropharynx is clear and moist and mucous membranes are normal.  Eyes: Conjunctivae are normal.  Neck: Normal range of motion. Neck supple.  Cardiovascular: Normal rate, regular rhythm and normal heart sounds.  Pulmonary/Chest: Effort normal and breath sounds normal.  Frequent, dry cough.   Lymphadenopathy:    She has no cervical adenopathy.  Neurological: She is alert and oriented to person, place, and time.  Skin: Skin is warm and dry. She is not diaphoretic.  Psychiatric: She has a normal mood and affect. Her behavior is normal. Judgment and thought content normal.  Vitals reviewed.     BP 138/88   Pulse 96   Temp 98.5 F (36.9 C) (Oral)   Wt 199 lb 8 oz (90.5 kg)   LMP 05/18/2017   SpO2 99%   BMI 34.24 kg/m  Wt Readings from Last 3 Encounters:  06/12/17 199  lb 8 oz (90.5 kg)  02/04/16 195 lb (88.5 kg)  01/27/16 195 lb (88.5 kg)       Assessment & Plan:  1. Viral URI with cough - likely viral and she seems to be improving, provided wait and see antibiotic if not better in 3-4 days - Provided written and verbal information regarding diagnosis and treatment. - benzonatate (TESSALON) 100 MG capsule; Take 1-2 capsules (100-200 mg total) by mouth 3 (three) times daily as needed for cough.  Dispense: 30 capsule; Refill: 0 - amoxicillin (AMOXIL) 875 MG tablet; Take 1 tablet (875 mg total) by mouth 2 (two) times daily.  Dispense: 14 tablet; Refill: 0 -  Patient Instructions  For nasal congestion you can use Afrin nasal spray for 3 days max,  Sudafed, saline nasal spray (generic is fine for all). Take plain mucinex to thin your phlegm Drink enough fluids to make your urine light yellow. For fever/chill/muscle aches you can take over the counter acetaminophen or ibuprofen.  Please come back in if you are not better in 5-7 days or if you develop wheezing, shortness of breath or persistent vomiting.    Clarene Reamer, FNP-BC  [facility]  06/13/2017 7:15 AM

## 2017-06-12 NOTE — Patient Instructions (Signed)
For nasal congestion you can use Afrin nasal spray for 3 days max, Sudafed, saline nasal spray (generic is fine for all). Take plain mucinex to thin your phlegm Drink enough fluids to make your urine light yellow. For fever/chill/muscle aches you can take over the counter acetaminophen or ibuprofen.  Please come back in if you are not better in 5-7 days or if you develop wheezing, shortness of breath or persistent vomiting.

## 2017-06-13 ENCOUNTER — Encounter: Payer: Self-pay | Admitting: Family Medicine

## 2017-06-14 ENCOUNTER — Telehealth: Payer: Self-pay | Admitting: Family Medicine

## 2017-06-14 ENCOUNTER — Encounter: Payer: Self-pay | Admitting: Family Medicine

## 2017-06-14 NOTE — Telephone Encounter (Signed)
Copied from [place] [id number]. Topic: Quick Communication - See Telephone Encounter >> Jun 14, 2017 11:32 AM Ahmed Prima L wrote: CRM for notification. See Telephone encounter for:   06/14/17.  Patient was in on Monday to see Carlean Purl at [facility]. She said she feels no better with the antibiotic (amoxicillin) that she is on.  Symptoms fever/coughing/headache. She wants to know if another script could be called in.   Call back is [place], [place]

## 2017-06-14 NOTE — Telephone Encounter (Signed)
Called and spoke to patient. No worse, temp to 99. Some relief with tessalon. Resting and forcing fluids. Encouraged her to continue current meds, may take another 24-48 hours to see improvement. Will write her a new OOW note through today and she will access through her Mychart account.

## 2017-06-19 ENCOUNTER — Other Ambulatory Visit: Payer: Self-pay | Admitting: Family Medicine

## 2017-06-19 ENCOUNTER — Encounter: Payer: Self-pay | Admitting: Family Medicine

## 2017-06-19 MED ORDER — PREDNISONE 10 MG PO TABS: ORAL_TABLET | ORAL | 0 refills | Status: DC

## 2017-06-21 ENCOUNTER — Emergency Department
Admission: EM | Admit: 2017-06-21 | Discharge: 2017-06-22 | Disposition: A | Discharge status: Home / Self Care | Payer: BLUE CROSS/BLUE SHIELD | Attending: Emergency Medicine | Admitting: Emergency Medicine

## 2017-06-21 ENCOUNTER — Encounter: Payer: Self-pay | Admitting: Emergency Medicine

## 2017-06-21 ENCOUNTER — Ambulatory Visit: Payer: Self-pay | Admitting: *Deleted

## 2017-06-21 DIAGNOSIS — E1165 Type 2 diabetes mellitus with hyperglycemia: Secondary | ICD-10-CM | POA: Diagnosis not present

## 2017-06-21 DIAGNOSIS — Z79899 Other long term (current) drug therapy: Secondary | ICD-10-CM | POA: Insufficient documentation

## 2017-06-21 DIAGNOSIS — R358 Other polyuria: Secondary | ICD-10-CM | POA: Diagnosis not present

## 2017-06-21 DIAGNOSIS — R739 Hyperglycemia, unspecified: Secondary | ICD-10-CM

## 2017-06-21 LAB — URINALYSIS, COMPLETE (UACMP) WITH MICROSCOPIC
BILIRUBIN URINE: NEGATIVE
Bacteria, UA: NONE SEEN
HGB URINE DIPSTICK: NEGATIVE
Ketones, ur: NEGATIVE mg/dL
Leukocytes, UA: NEGATIVE
NITRITE: NEGATIVE
PROTEIN: NEGATIVE mg/dL
RBC / HPF: NONE SEEN RBC/hpf (ref 0–5)
Specific Gravity, Urine: 1.005 (ref 1.005–1.030)
WBC UA: NONE SEEN WBC/hpf (ref 0–5)
pH: 6 (ref 5.0–8.0)

## 2017-06-21 LAB — BASIC METABOLIC PANEL
Anion gap: 13 (ref 5–15)
BUN: 12 mg/dL (ref 6–20)
CHLORIDE: 95 mmol/L — AB (ref 101–111)
CO2: 22 mmol/L (ref 22–32)
CREATININE: 0.82 mg/dL (ref 0.44–1.00)
Calcium: 9.7 mg/dL (ref 8.9–10.3)
GFR calc Af Amer: >60 mL/min (ref >60)
GFR calc non Af Amer: >60 mL/min (ref >60)
Glucose, Bld: 448 mg/dL — ABNORMAL HIGH (ref 65–99)
POTASSIUM: 3.9 mmol/L (ref 3.5–5.1)
Sodium: 130 mmol/L — ABNORMAL LOW (ref 135–145)

## 2017-06-21 LAB — CBC
HEMATOCRIT: 42.7 % (ref 35.0–47.0)
Hemoglobin: 13.8 g/dL (ref 12.0–16.0)
MCH: 25.1 pg — ABNORMAL LOW (ref 26.0–34.0)
MCHC: 32.3 g/dL (ref 32.0–36.0)
MCV: 77.8 fL — AB (ref 80.0–100.0)
Platelets: 272 10*3/uL (ref 150–440)
RBC: 5.49 MIL/uL — ABNORMAL HIGH (ref 3.80–5.20)
RDW: 16.4 % — AB (ref 11.5–14.5)
WBC: 9.9 10*3/uL (ref 3.6–11.0)

## 2017-06-21 LAB — GLUCOSE, CAPILLARY
Glucose-Capillary: 435 mg/dL — ABNORMAL HIGH (ref 65–99)
Glucose-Capillary: 406 mg/dL — ABNORMAL HIGH (ref 65–99)

## 2017-06-21 LAB — POCT PREGNANCY, URINE: PREG TEST UR: NEGATIVE

## 2017-06-21 MED ORDER — SODIUM CHLORIDE 0.9 % IV BOLUS (SEPSIS)
1000.0000 mL | Freq: Once | INTRAVENOUS | Status: AC
  Administered 2017-06-21: 1000 mL via INTRAVENOUS

## 2017-06-21 NOTE — Telephone Encounter (Signed)
Pt calling that she checked her blood sugar approximately 10-15 min before calling the office and the level was 484. Pt states she was diagnosed as being a diabetic but is diet controlled. Pt states she was started on steroids on yesterday and decided to check blood sugar today because she knew that the steroids would increase her blood sugar. Pt states when she usually checks her blood sugar it usually ranges between the 200s-300s and is not currently on insulin or oral medication. Pt states she has noticed increased urination and thirst in the past 2 days put thought it was due to taking the steroids. Pt advised to seek treatment  In the ED. Pt verbalized understanding and states she will go to the ED for treatment.  Reason for Disposition . [1] Blood glucose > 240 mg/dl (13 mmol/l) AND [2] rapid breathing  Answer Assessment - Initial Assessment Questions 1. BLOOD GLUCOSE: "What is your blood glucose level?"      484 2. ONSET: "When did you check the blood glucose?"     10-39min 3. USUAL RANGE: "What is your glucose level usually?" (e.g., usual fasting morning value, usual evening value)     Been in the 300s and high 200s 4. KETONES: "Do you check for ketones (urine or blood test strips)?" If yes, ask: "What does the test show now?"      No 5. TYPE 1 or 2:  "Do you know what type of diabetes you have?"  (e.g., Type 1, Type 2, Gestational; doesn't know)      Type 2 6. INSULIN: "Do you take insulin?" If yes, ask: "Have you missed any shots recently?"     No  7. DIABETES PILLS: "Do you take any pills for your diabetes?" If yes, ask: "Have you missed taking any pills recently?"     No 8. OTHER SYMPTOMS: "Do you have any symptoms?" (e.g., fever, frequent urination, difficulty breathing, dizziness, weakness, vomiting)     Increased urination- got up, thirsty, a little dizziness 9. PREGNANCY: "Is there any chance you are pregnant?" "When was your last menstrual period?"     No, last week was the  last menstrual cycle  Protocols used: DIABETES - HIGH BLOOD SUGAR-A-AH

## 2017-06-21 NOTE — ED Triage Notes (Signed)
Pt comes into the ED via POV c/o hyperglycemia that has been going on for a couple of days in the 400 range.  Patient has been treated for an URI and was placed on steroids.  Patient denies any chest pain, shortness of breath or dizziness but states she has increased thirst, frequent urination, and a headache.  Patient neurologically intact at this time and in NAD with even and unlabored respirations.

## 2017-06-22 LAB — GLUCOSE, CAPILLARY
GLUCOSE-CAPILLARY: 305 mg/dL — AB (ref 65–99)
Glucose-Capillary: 349 mg/dL — ABNORMAL HIGH (ref 65–99)

## 2017-06-22 LAB — INFLUENZA PANEL BY PCR (TYPE A & B)
INFLAPCR: NEGATIVE
Influenza B By PCR: NEGATIVE

## 2017-06-22 MED ORDER — SODIUM CHLORIDE 0.9 % IV BOLUS (SEPSIS)
1000.0000 mL | Freq: Once | INTRAVENOUS | Status: AC
  Administered 2017-06-22: 1000 mL via INTRAVENOUS

## 2017-06-22 MED ORDER — METFORMIN HCL 500 MG PO TABS: 500.0000 mg | ORAL_TABLET | Freq: Two times a day (BID) | ORAL | 0 refills | Status: DC

## 2017-06-22 MED ORDER — METFORMIN HCL 500 MG PO TABS: 500.0000 mg | ORAL_TABLET | Freq: Once | ORAL | Status: DC

## 2017-06-22 MED ORDER — METFORMIN HCL 500 MG PO TABS
500.0000 mg | ORAL_TABLET | Freq: Once | ORAL | Status: AC
  Administered 2017-06-22: 500 mg via ORAL
  Filled 2017-06-22: qty 1

## 2017-06-22 NOTE — Telephone Encounter (Signed)
fyi

## 2017-06-22 NOTE — Telephone Encounter (Signed)
Patient went to ED 06/21/17

## 2017-06-22 NOTE — Telephone Encounter (Signed)
Please follow-up with the patient to see how she is doing after being in the ED. Please see what her BP is as it appears it was elevated in the ED. We should try to get her in to the office early next week if possible for the patient. Thanks.

## 2017-06-22 NOTE — ED Provider Notes (Signed)
[facility] Emergency Department Provider Note   First MD Initiated Contact with Patient 06/21/17 2348     (approximate)  I have reviewed the triage vital signs and the nursing notes.   HISTORY  Chief Complaint Hyperglycemia   HPI Nicole Bryant is a 47 y.o. female with below list of chronic medical conditions including diabetes and recent URI currently taking prednisone presents to the emergency department with hyperglycemia.  Patient states that her glucose has been running in the 400s at home of the past 24 hours.  Patient states that she is currently not taking any medications and has never been prescribed anything for her diabetes secondary to the fact that she did not return to her doctor for any evaluation because she was "feeling well.  Patient does admit to polyuria polydipsia.  Patient denies any fever.   Past Medical History:  Diagnosis Date  . Allergic rhinitis   . Chickenpox   . Diabetes ([place])    currently no meds- trying to control with diet  . Fatty liver   . GERD (gastroesophageal reflux disease)   . Hyperlipemia   . Norovirus 07/2015  . Parathyroid abnormality ([place])   . Raynaud disease   . Tubular adenoma of colon   . UTI (lower urinary tract infection)     Patient Active Problem List   Diagnosis Date Noted  . Chronic cholecystitis without calculus s/p lap cholecystectomy 02/04/2016 02/04/2016  . Elevated blood pressure 12/29/2015  . Abdominal pain, chronic, right upper quadrant 12/29/2015  . Sleeping difficulty 12/29/2015  . Non-insulin dependent type 2 diabetes mellitus ([place])   . Enteritis   . Metabolic acidosis   . 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 Hyperlipidemia 08/03/2015  . Guaiac positive stools 08/03/2015    Past Surgical History:  Procedure Laterality Date  . BREAST CYST EXCISION    . LAPAROSCOPIC CHOLECYSTECTOMY SINGLE SITE WITH INTRAOPERATIVE CHOLANGIOGRAM N/A 02/04/2016   Procedure: LAPAROSCOPIC  CHOLECYSTECTOMY SINGLE SITE WITH INTRAOPERATIVE CHOLANGIOGRAM AND STUDY OF BILE DUCTS;  Surgeon: Michael Boston, MD;  Location: [facility] ORS;  Service: General;  Laterality: N/A;  . PARATHYROIDECTOMY  2009  . TUBAL LIGATION  2008    Prior to Admission medications   Medication Sig Start Date End Date Taking? Authorizing Provider  albuterol (PROVENTIL HFA;VENTOLIN HFA) 108 (90 Base) MCG/ACT inhaler Inhale 2 puffs into the lungs every 6 (six) hours as needed for wheezing or shortness of breath. 08/14/15   Leone Haven, MD  amoxicillin (AMOXIL) 875 MG tablet Take 1 tablet (875 mg total) by mouth 2 (two) times daily. 06/12/17   Elby Beck, FNP  benzonatate (TESSALON) 100 MG capsule Take 1-2 capsules (100-200 mg total) by mouth 3 (three) times daily as needed for cough. 06/12/17   Elby Beck, FNP  Blood Glucose Monitoring Suppl (RELION CONFIRM GLUCOSE MONITOR) w/Device KIT Check your blood glucose once daily before breakfast 08/03/15   Leone Haven, MD  cholecalciferol (VITAMIN D) 1000 units tablet Take 5,000 Units by mouth daily.    [provider]  fluticasone Asencion Islam) 50 MCG/ACT nasal spray USE 2 SPRAYS IN EACH NOSTRIL DAILY 11/04/16   Leone Haven, MD  glucose blood (RELION GLUCOSE TEST STRIPS) test strip Check your blood glucose once daily before breakfast 08/03/15   Leone Haven, MD  Lancet Devices (ONE TOUCH DELICA LANCING DEV) [place]  08/03/15   [provider]  loratadine (CLARITIN) 10 MG tablet Take 10 mg by mouth daily.  [provider]  metoCLOPramide (REGLAN) 10 MG tablet Take 1 tablet (10 mg total) by mouth 4 (four) times daily. Patient not taking: Reported on 06/12/2017 12/16/15   Jerene Bears, MD  naproxen sodium (ANAPROX) 220 MG tablet Take 220 mg by mouth 2 (two) times daily with a meal.    [provider]  omeprazole (PRILOSEC) 40 MG capsule TAKE 1 CAPSULE BY MOUTH TWICE DAILY BEFORE A MEAL. Patient taking differently: TAKE  1 CAPSULE BY MOUTH DAILY BEFORE A MEAL. 04/18/17   Leone Haven, MD  predniSONE (DELTASONE) 10 MG tablet Take 5 x 1 day, 4x1, 3x1, 2x1, 1x1 06/19/17   Elby Beck, FNP  Pyridoxine HCl (VITAMIN B6) 200 MG TABS Take 1 tablet by mouth daily.     [provider]  RELION LANCETS STANDARD 21G MISC Check your blood sugar once daily before breakfast. 08/03/15   Leone Haven, MD    Allergies Codeine and Sulfa antibiotics  Family History  Problem Relation Age of Onset  . Stroke Father   . Heart disease Maternal Grandfather   . Hypertension Paternal Aunt   . Diabetes Paternal Aunt   . Cancer Cousin        Peritoneal carcinoma  . Prostate cancer Unknown        great uncle  . Heart disease Paternal Grandmother   . Diverticulosis Mother   . GER disease Maternal Aunt   . Colon cancer Neg Hx     Social History Social History   Tobacco Use  . Smoking status: Never Smoker  . Smokeless tobacco: Never Used  Substance Use Topics  . Alcohol use: No    Alcohol/week: 0.0 oz  . Drug use: No    Review of Systems Constitutional: No fever/chills Eyes: No visual changes. ENT: No sore throat. Cardiovascular: Denies chest pain. Respiratory: Denies shortness of breath. Gastrointestinal: No abdominal pain.  No nausea, no vomiting.  No diarrhea.  No constipation. Genitourinary: Negative for dysuria. Musculoskeletal: Negative for neck pain.  Negative for back pain. Integumentary: Negative for rash. Neurological: Negative for headaches, focal weakness or numbness. Endocrine: Polyuria polydipsia.  ____________________________________________   PHYSICAL EXAM:  VITAL SIGNS: ED Triage Vitals  Enc Vitals Group     BP 06/21/17 2001 (!) 201/113     Pulse Rate 06/21/17 1959 (!) 119     Resp 06/21/17 1959 18     Temp 06/21/17 1959 97.8 F (36.6 C)     Temp Source 06/21/17 1959 Oral     SpO2 06/21/17 1959 100 %     Weight 06/21/17 2000 90.3 kg (199 lb)     Height 06/21/17  2000 1.651 m (5' 5" )     Head Circumference --      Peak Flow --      Pain Score 06/21/17 2000 3     Pain Loc --      Pain Edu? --      Excl. in [place]? --     Constitutional: Alert and oriented. Well appearing and in no acute distress. Eyes: Conjunctivae are normal.  Head: Atraumatic. Mouth/Throat: Mucous membranes are moist.  Oropharynx non-erythematous. Neck: No stridor.   Cardiovascular: Normal rate, regular rhythm. Good peripheral circulation. Grossly normal heart sounds. Respiratory: Normal respiratory effort.  No retractions. Lungs CTAB. Gastrointestinal: Soft and nontender. No distention.  Musculoskeletal: No lower extremity tenderness nor edema. No gross deformities of extremities. Neurologic:  Normal speech and language. No gross focal neurologic deficits are appreciated.  Skin:  Skin is warm, dry and intact. No rash noted. Psychiatric: Mood and affect are normal. Speech and behavior are normal.  ____________________________________________   LABS (all labs ordered are listed, but only abnormal results are displayed)  Labs Reviewed  GLUCOSE, CAPILLARY - Abnormal; Notable for the following components:      Result Value   Glucose-Capillary 435 (*)    All other components within normal limits  BASIC METABOLIC PANEL - Abnormal; Notable for the following components:   Sodium 130 (*)    Chloride 95 (*)    Glucose, Bld 448 (*)    All other components within normal limits  CBC - Abnormal; Notable for the following components:   RBC 5.49 (*)    MCV 77.8 (*)    MCH 25.1 (*)    RDW 16.4 (*)    All other components within normal limits  URINALYSIS, COMPLETE (UACMP) WITH MICROSCOPIC - Abnormal; Notable for the following components:   Color, Urine COLORLESS (*)    APPearance CLEAR (*)    Glucose, UA >=500 (*)    Squamous Epithelial / LPF 0-5 (*)    All other components within normal limits  GLUCOSE, CAPILLARY - Abnormal; Notable for the following components:    Glucose-Capillary 406 (*)    All other components within normal limits  GLUCOSE, CAPILLARY - Abnormal; Notable for the following components:   Glucose-Capillary 349 (*)    All other components within normal limits  INFLUENZA PANEL BY PCR (TYPE A & B)  CBG MONITORING, ED  POC URINE PREG, ED  POCT PREGNANCY, URINE     Procedures   ____________________________________________   INITIAL IMPRESSION / ASSESSMENT AND PLAN / ED COURSE  As part of my medical decision making, I reviewed the following data within the electronic MEDICAL RECORD [id number] year old female presented with above-stated history of hyperglycemia with known history of diabetes not currently on medication and currently taking steroids.  Spoke with the patient at length regarding glucose management and necessity of outpatient follow-up.  Patient was given 2 L IV normal saline in the emergency department as well as metformin 500 mg with recommendation to follow-up with primary care provider today.  ____________________________________  FINAL CLINICAL IMPRESSION(S) / ED DIAGNOSES  Final diagnoses:  Hyperglycemia  Type 2 diabetes mellitus with hyperglycemia, without long-term current use of insulin (HCC)     MEDICATIONS GIVEN DURING THIS VISIT:  Medications  sodium chloride 0.9 % bolus 1,000 mL (1,000 mLs Intravenous New Bag/Given 06/22/17 0015)  metFORMIN (GLUCOPHAGE) tablet 500 mg (not administered)  sodium chloride 0.9 % bolus 1,000 mL (0 mLs Intravenous Stopped 06/22/17 0009)  metFORMIN (GLUCOPHAGE) tablet 500 mg (500 mg Oral Given 06/22/17 0018)     ED Discharge Orders    None       Note:  This document was prepared using Dragon voice recognition software and may include unintentional dictation errors.    Gregor Hams, MD 06/22/17 [phone]

## 2017-06-22 NOTE — ED Notes (Signed)
Pt stating that that her "sugar was high." Pt stating it was 484 at home. Pt stating her doctor sent her here. Pt stating HA also

## 2017-06-23 ENCOUNTER — Telehealth: Payer: Self-pay | Admitting: *Deleted

## 2017-06-23 NOTE — Telephone Encounter (Signed)
Patient scheduled for ER follow up per CMA patient is aware.

## 2017-06-23 NOTE — Telephone Encounter (Signed)
Copied from [place]. Topic: Appointment Scheduling - Scheduling Inquiry for Clinic >> Jun 22, 2017 10:02 AM Aurelio Brash B wrote: Reason for CRM: PT was seen in ER yesterday for hyperglycemia and needs a hospital follow up apt with Dr Caryl Bis.  I did not see an available  apt in the time frame the pt needs.  (1 week)  Can the office get her fit in to Dr Ellen Henri schedule?

## 2017-06-23 NOTE — Telephone Encounter (Signed)
Patient states she was given medication for her sugar and it has come down, she is scheduled for Monday at 1130 with you for follow up

## 2017-06-24 NOTE — Telephone Encounter (Signed)
Noted. Plan to see on Monday.

## 2017-06-26 ENCOUNTER — Encounter: Payer: Self-pay | Admitting: Family Medicine

## 2017-06-26 ENCOUNTER — Ambulatory Visit: Payer: BLUE CROSS/BLUE SHIELD | Admitting: Family Medicine

## 2017-06-26 VITALS — BP 170/110 | HR 97 | Temp 98.6°F | Wt 202.8 lb

## 2017-06-26 DIAGNOSIS — J069 Acute upper respiratory infection, unspecified: Secondary | ICD-10-CM

## 2017-06-26 DIAGNOSIS — E611 Iron deficiency: Secondary | ICD-10-CM | POA: Insufficient documentation

## 2017-06-26 DIAGNOSIS — E119 Type 2 diabetes mellitus without complications: Secondary | ICD-10-CM | POA: Diagnosis not present

## 2017-06-26 DIAGNOSIS — R718 Other abnormality of red blood cells: Secondary | ICD-10-CM | POA: Diagnosis not present

## 2017-06-26 DIAGNOSIS — I10 Essential (primary) hypertension: Secondary | ICD-10-CM | POA: Diagnosis not present

## 2017-06-26 LAB — MICROALBUMIN / CREATININE URINE RATIO
Creatinine,U: 16.9 mg/dL
MICROALB UR: 1.7 mg/dL (ref 0.0–1.9)
Microalb Creat Ratio: 9.8 mg/g (ref 0.0–30.0)

## 2017-06-26 LAB — COMPREHENSIVE METABOLIC PANEL
ALBUMIN: 4.2 g/dL (ref 3.5–5.2)
ALK PHOS: 88 U/L (ref 39–117)
ALT: 24 U/L (ref 0–35)
AST: 23 U/L (ref 0–37)
BILIRUBIN TOTAL: 0.7 mg/dL (ref 0.2–1.2)
BUN: 13 mg/dL (ref 6–23)
CO2: 23 mEq/L (ref 19–32)
Calcium: 9.5 mg/dL (ref 8.4–10.5)
Chloride: 97 mEq/L (ref 96–112)
Creatinine, Ser: 0.68 mg/dL (ref 0.40–1.20)
GFR: 98.7 mL/min (ref >60.00)
GLUCOSE: 278 mg/dL — AB (ref 70–99)
Potassium: 3.7 mEq/L (ref 3.5–5.1)
SODIUM: 134 meq/L — AB (ref 135–145)
TOTAL PROTEIN: 7.5 g/dL (ref 6.0–8.3)

## 2017-06-26 LAB — TSH: TSH: 3.52 u[IU]/mL (ref 0.35–4.50)

## 2017-06-26 MED ORDER — AMLODIPINE BESYLATE 5 MG PO TABS: 5.0000 mg | ORAL_TABLET | Freq: Every day | ORAL | 3 refills | Status: DC

## 2017-06-26 NOTE — Assessment & Plan Note (Signed)
Elevated glucose.  Possibly exacerbated by prednisone use.  She was symptomatic.  Symptoms have been improving with metformin.  Will check an A1c today.  Consider titrating up on the metformin or adding additional medication.

## 2017-06-26 NOTE — Patient Instructions (Signed)
Nice to see you. We will start her on amlodipine for your blood pressure.  Please start this today. Please continue your metformin.  We will check lab work today and contact you with the results.  I will see you back in 1 week. If you develop chest pain, shortness of breath, cough productive of blood, confusion, nausea, vomiting please be evaluated immediately.

## 2017-06-26 NOTE — Assessment & Plan Note (Addendum)
Elevated today.  Was elevated in the ED.  Potentially may be a reaction to the prednisone she was on though given level of elevation she would benefit from starting on medication at least in the short-term.  She is asymptomatic.  Amlodipine was sent to her pharmacy.  She will return in 1 week to see me for recheck.  Given return precautions.

## 2017-06-26 NOTE — Progress Notes (Signed)
[name], MD Phone: [phone]  Nicole Bryant is a 47 y.o. female who presents today for follow-up.  Recently seen in the ED for elevated glucose.  Notes they started her on metformin.  She notes polyuria and polydipsia though those have improved somewhat with the metformin.  No hypoglycemia.  She received fluids in the ED.  Did have a sugar up to 600 over the weekend.  She notes she overall feels weak though otherwise feels at her baseline.  Her blood pressure was noted to be elevated as well.  It did trend down.  She had headache with it previously though no vision changes, focal weakness, or numbness.  She notes no chest pressure.  No shortness of breath.  No edema.  She was treated with a prednisone burst after being treated for an upper respiratory illness with an antibiotic.  She notes her symptoms from her respiratory illness have been improving.  She notes irritation in her chest like she needs to cough.  Cough is nonproductive now.  Overall quite a bit better after the steroids.  Social History   Tobacco Use  Smoking Status Never Smoker  Smokeless Tobacco Never Used     ROS see history of present illness  Objective  Physical Exam Vitals:   06/26/17 1149 06/26/17 1233  BP: (!) 190/110 (!) 170/110  Pulse:    Temp:    SpO2:      BP Readings from Last 3 Encounters:  06/26/17 (!) 170/110  06/22/17 (!) 164/92  06/12/17 138/88   Wt Readings from Last 3 Encounters:  06/26/17 202 lb 12.8 oz (92 kg)  06/21/17 199 lb (90.3 kg)  06/12/17 199 lb 8 oz (90.5 kg)    Physical Exam  Constitutional: No distress.  HENT:  Head: Normocephalic and atraumatic.  Mouth/Throat: Oropharynx is clear and moist. No oropharyngeal exudate.  Eyes: Conjunctivae are normal. Pupils are equal, round, and reactive to light.  Cardiovascular: Normal rate, regular rhythm and normal heart sounds.  Pulmonary/Chest: Effort normal and breath sounds normal.  Musculoskeletal: She  exhibits no edema.  Neurological: She is alert. Gait normal.  CN 2-12 intact, 5/5 strength in bilateral biceps, triceps, grip, quads, hamstrings, plantar and dorsiflexion, sensation to light touch intact in bilateral UE and LE  Skin: Skin is warm and dry. She is not diaphoretic.     Assessment/Plan: Please see individual problem list.  Hypertension Elevated today.  Was elevated in the ED.  Potentially may be a reaction to the prednisone she was on though given level of elevation she would benefit from starting on medication at least in the short-term.  She is asymptomatic.  Amlodipine was sent to her pharmacy.  She will return in 1 week to see me for recheck.  Given return precautions.  Non-insulin dependent type 2 diabetes mellitus (HCC) Elevated glucose.  Possibly exacerbated by prednisone use.  She was symptomatic.  Symptoms have been improving with metformin.  Will check an A1c today.  Consider titrating up on the metformin or adding additional medication.  URI (upper respiratory infection) Patient is progressively improving with her respiratory symptoms.  Still has a little bit of irritation dictation of needing to cough though quite a bit better after her steroids.  She will continue to monitor.  If worsen she will be reevaluated.  Microcytosis Noted on recent lab work.  She notes pica symptoms.  We will check iron levels.   Orders Placed This Encounter  Procedures  . Comp Met (CMET)  .  Urine Microalbumin w/creat. ratio  . Iron, TIBC and Ferritin Panel  . TSH  . HgB A1c    Meds ordered this encounter  Medications  . amLODipine (NORVASC) 5 MG tablet    Sig: Take 1 tablet (5 mg total) by mouth daily.    Dispense:  90 tablet    Refill:  [place], MD [place]

## 2017-06-26 NOTE — Assessment & Plan Note (Signed)
Noted on recent lab work.  She notes pica symptoms.  We will check iron levels.

## 2017-06-26 NOTE — Assessment & Plan Note (Signed)
Patient is progressively improving with her respiratory symptoms.  Still has a little bit of irritation dictation of needing to cough though quite a bit better after her steroids.  She will continue to monitor.  If worsen she will be reevaluated.

## 2017-06-27 ENCOUNTER — Other Ambulatory Visit: Payer: Self-pay | Admitting: Family Medicine

## 2017-06-27 DIAGNOSIS — E611 Iron deficiency: Secondary | ICD-10-CM

## 2017-06-27 LAB — HEMOGLOBIN A1C
HEMOGLOBIN A1C: 11.4 %{Hb} — AB (ref <5.7)
Mean Plasma Glucose: 280 (calc)
eAG (mmol/L): 15.5 (calc)

## 2017-06-27 LAB — IRON,TIBC AND FERRITIN PANEL
%SAT: 9 % (calc) — ABNORMAL LOW (ref 11–50)
FERRITIN: 9 ng/mL — AB (ref 10–232)
IRON: 36 ug/dL — AB (ref 40–190)
TIBC: 420 mcg/dL (calc) (ref 250–450)

## 2017-06-27 MED ORDER — METFORMIN HCL 500 MG PO TABS: 1000.0000 mg | ORAL_TABLET | Freq: Two times a day (BID) | ORAL | 3 refills | Status: DC

## 2017-06-27 MED ORDER — EMPAGLIFLOZIN 10 MG PO TABS: 10.0000 mg | ORAL_TABLET | Freq: Every day | ORAL | 3 refills | Status: DC

## 2017-06-28 ENCOUNTER — Encounter: Payer: Self-pay | Admitting: Family Medicine

## 2017-06-28 MED ORDER — FERROUS SULFATE 325 (65 FE) MG PO TABS: 325.0000 mg | ORAL_TABLET | Freq: Two times a day (BID) | ORAL | 1 refills | Status: DC

## 2017-06-30 ENCOUNTER — Other Ambulatory Visit: Payer: Self-pay | Admitting: Family Medicine

## 2017-06-30 DIAGNOSIS — E611 Iron deficiency: Secondary | ICD-10-CM

## 2017-07-03 ENCOUNTER — Ambulatory Visit: Payer: BLUE CROSS/BLUE SHIELD | Admitting: Family Medicine

## 2017-07-03 ENCOUNTER — Encounter: Payer: Self-pay | Admitting: Family Medicine

## 2017-07-03 DIAGNOSIS — E611 Iron deficiency: Secondary | ICD-10-CM | POA: Diagnosis not present

## 2017-07-03 DIAGNOSIS — I10 Essential (primary) hypertension: Secondary | ICD-10-CM | POA: Diagnosis not present

## 2017-07-03 DIAGNOSIS — E119 Type 2 diabetes mellitus without complications: Secondary | ICD-10-CM

## 2017-07-03 MED ORDER — AMLODIPINE BESYLATE 10 MG PO TABS: 10.0000 mg | ORAL_TABLET | Freq: Every day | ORAL | 3 refills | Status: DC

## 2017-07-03 NOTE — Assessment & Plan Note (Signed)
Improved though not at goal.  We will increase Norvasc to 10 mg daily.  Recheck in 1 week with clinical pharmacist.

## 2017-07-03 NOTE — Assessment & Plan Note (Signed)
Iron deficient with no anemia.  She will continue iron supplementation.  Check stool cards.  Does not sound as though this would be related to menstrual cycle.  Could be related to malabsorption given loose stools after cholecystectomy.  Discussed trying to use formed stool for her stool cards.  Plan to recheck in 1 month.

## 2017-07-03 NOTE — Assessment & Plan Note (Signed)
Blood sugars have been improving.  Discussed monitoring her stools with the metformin.  We may have to back off on the dose.  She will continue the [place].  Discussed risk for UTI, yeast infection, and gangrene.  Follow-up with clinical pharmacist in 1 week.

## 2017-07-03 NOTE — Patient Instructions (Signed)
Nice to see you. Reagan increase her amlodipine to 10 mg daily. Please continue your other diabetic regimen. Please continue the iron.

## 2017-07-03 NOTE — Progress Notes (Signed)
  [name], MD Phone: [phone]  Nicole Bryant is a 47 y.o. female who presents today for follow-up.  HYPERTENSION  Disease Monitoring  Home BP Monitoring not checking Chest pain- no    Dyspnea- no Medications  Compliance-  Taking amlodipine.  Edema- no  DIABETES Disease Monitoring: Blood Sugar ranges-157-190 Polyuria/phagia/dipsia- no      Visual problems- no Medications: Compliance- taking metformin, jardiance Hypoglycemic symptoms- no  Iron deficiency: Noted to be iron deficient though not anemic.  Taking the iron supplement now.  One menstrual cycle per month lasting 3-4 days.  No bloody bowel movements.  Does have a history of cholecystectomy. Does have frequent loose stools.     Social History   Tobacco Use  Smoking Status Never Smoker  Smokeless Tobacco Never Used     ROS see history of present illness  Objective  Physical Exam Vitals:   07/03/17 0921 07/03/17 0938  BP: (!) 154/110 (!) 152/98  Pulse: 89   Temp: 97.9 F (36.6 C)   SpO2: 99%     BP Readings from Last 3 Encounters:  07/03/17 (!) 152/98  06/26/17 (!) 170/110  06/22/17 (!) 164/92   Wt Readings from Last 3 Encounters:  07/03/17 198 lb (89.8 kg)  06/26/17 202 lb 12.8 oz (92 kg)  06/21/17 199 lb (90.3 kg)    Physical Exam  Constitutional: No distress.  Cardiovascular: Normal rate, regular rhythm and normal heart sounds.  Pulmonary/Chest: Effort normal and breath sounds normal.  Musculoskeletal: She exhibits no edema.  Neurological: She is alert. Gait normal.  Skin: Skin is warm and dry. She is not diaphoretic.     Assessment/Plan: Please see individual problem list.  Hypertension Improved though not at goal.  We will increase Norvasc to 10 mg daily.  Recheck in 1 week with clinical pharmacist.  Non-insulin dependent type 2 diabetes mellitus ([place]) Blood sugars have been improving.  Discussed monitoring her stools with the metformin.  We may have to back off on  the dose.  She will continue the [place].  Discussed risk for UTI, yeast infection, and gangrene.  Follow-up with clinical pharmacist in 1 week.  Iron deficiency Iron deficient with no anemia.  She will continue iron supplementation.  Check stool cards.  Does not sound as though this would be related to menstrual cycle.  Could be related to malabsorption given loose stools after cholecystectomy.  Discussed trying to use formed stool for her stool cards.  Plan to recheck in 1 month.   No orders of the defined types were placed in this encounter.   Meds ordered this encounter  Medications  . amLODipine (NORVASC) 10 MG tablet    Sig: Take 1 tablet (10 mg total) by mouth daily.    Dispense:  90 tablet    Refill:  [place], MD [place]

## 2017-07-04 ENCOUNTER — Encounter: Payer: Self-pay | Admitting: Family Medicine

## 2017-07-04 LAB — HM DIABETES EYE EXAM

## 2017-07-04 NOTE — Telephone Encounter (Signed)
Patient states she noticed it last week but thought it was her contacts. Patient denies any other symptoms and states she has an appointment with her eye doctor on Thursday. Notified patient per Dr.Sonnenberg it is ok to wait until Thursday. Informed patient if she develops any other symptoms or worsening symptoms she will need to be evaluated sooner.

## 2017-07-04 NOTE — Telephone Encounter (Signed)
Please call the patient and see what other symptoms she is having. The vision changes are a very small possibility with the amlodipine though I do not see them as side effects with the metformin or jardiance. It could be related to the diabetes. I would suggest she see her eye doctor today if this is a sudden change. She did not mention this when I saw her yesterday. Thanks.

## 2017-07-06 ENCOUNTER — Encounter: Payer: Self-pay | Admitting: Family Medicine

## 2017-07-10 ENCOUNTER — Ambulatory Visit (INDEPENDENT_AMBULATORY_CARE_PROVIDER_SITE_OTHER): Payer: BLUE CROSS/BLUE SHIELD | Admitting: Pharmacist

## 2017-07-10 ENCOUNTER — Encounter: Payer: Self-pay | Admitting: Pharmacist

## 2017-07-10 VITALS — BP 146/98 | HR 78 | Ht 65.0 in | Wt 198.0 lb

## 2017-07-10 DIAGNOSIS — I10 Essential (primary) hypertension: Secondary | ICD-10-CM

## 2017-07-10 MED ORDER — LISINOPRIL 5 MG PO TABS: 5.0000 mg | ORAL_TABLET | Freq: Every day | ORAL | 1 refills | Status: DC

## 2017-07-10 MED ORDER — EMPAGLIFLOZIN 25 MG PO TABS: 25.0000 mg | ORAL_TABLET | Freq: Every day | ORAL | 3 refills | Status: DC

## 2017-07-10 NOTE — Patient Instructions (Addendum)
Thanks for coming to see me! You have made great improvements to your diet and exercise. Your job is to reduce the amount of processed foods, microwave dinners, canned foods, etc.   1. Increase your Jardiance to 25 mg once a day in the morning. You can take two 10 mg tablets until those are gone. If the price is unaffordable. Please call me.   2. Start lisinopril 5 mg daily in the morning   Come back 7-10 days to have your labs drawn and blood pressure rechecked. Ideally, schedule on a Monday with me so we can adjust your medications.   Here are two websites that I love for meal planning with diabetes.  [email or web address] [email or web address]

## 2017-07-10 NOTE — Progress Notes (Signed)
S:     Chief Complaint  Patient presents with  . Medication Management    DM, HTN    Patient arrives in good spirits, ambulating without assistance.  Presents for diabetes and hypertension evaluation, education, and management at the request of Dr. Caryl Bis (referred on 07/03/17). Recently went to the ED for hyperglycemia while on prednisone and metformin was started. Has seen Dr. Caryl Bis twice since then and during this time metformin was increased, Jardiance was started, and amlodipine was increased.   Patient reports she is tolerating the new medications well. Reports initial GI upset which has since resolved. Denies s/sx UTI or yeast infections. Reports HA with amlodipine that has been going on since she started the medication. She manages this by taking the amlodipine at night. Denies lower extremity edema. Reports she went to eye doctor recently for DM eye exam. States she is checking CBGs twice daily, states all CBGs have been in 100s. Thinks her loss of control of DM previously was 2/2 stress (was working two jobs)   [employer] some 30 minutes - 5 times/week, plays golf in the summers.   Patient reports Diabetes was diagnosed with first pregnancy (2002) controlled with diet/exercise, diagnosed with T2DM ~3 years ago.   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   Insurance coverage/medication affordability: BCBS [place] - just met deductible.   Patient reports adherence with medications.  Current diabetes medications include: jardiance 10 mg daily, metformin 1000 mg BID Current hypertension medications include: amlodipine 10 mg daily  Patient denies hypoglycemic events.  Patient reported dietary habits: Eats 3 meals/day, snacks twice/day Breakfast: canadian bacon, cheese, english muffin Lunch: [place] hotdog with ketchup, fritos (counted out serving sizes) Dinner: [place] hotdog with tater tots (counted out serving sizes) Snacks: cucumbers Drinks:water, V8,  diet Dr. Malachi Bonds  Patient reported exercise habits: walking 30 minutes at a time   Patient reports nocturia. 1-2x/night.  Patient denies pain/burning on urination.  Patient denies neuropathy. Patient reports visual changes. Patient reports self foot exams.   Goal weight = 150 lbs per patient  Flu shot - denies and declines today  O:  Physical Exam  Constitutional: She appears well-developed and well-nourished.     Review of Systems  Gastrointestinal: Negative for diarrhea, nausea and vomiting.  Genitourinary: Negative for dysuria, frequency and urgency.  All other systems reviewed and are negative.   GFR 98 (06/26/17) K 3.7 (06/26/17) Microalbumin, Urine 1.7 (06/26/2017) Last Vitamin D level 57 (07/07/2015) Last dental 2 years ago Last eye exam last week   Lab Results  Component Value Date   HGBA1C 11.4 (H) 06/26/2017   Vitals:   07/10/17 1502 07/10/17 1549  BP: (!) 154/108 (!) 146/98  Pulse: 78     Lipid Panel     Component Value Date/Time   CHOL 214 (H) 07/07/2015 1118   TRIG 320.0 (H) 07/07/2015 1118   HDL 37.80 (L) 07/07/2015 1118   CHOLHDL 6 07/07/2015 1118   VLDL 64.0 (H) 07/07/2015 1118   LDLDIRECT 130.0 07/07/2015 1118    Home fasting CBG: 170s-180s, yesterday 120 2 hour post-prandial/random CBG: 150s-180s  A/P: #Diabetes longstanding currently uncontrolled per A1C but greatly improved per CBG report. Patient denies hypoglycemic events and is able to verbalize appropriate hypoglycemia management plan. Patient reports adherence with medication. Control is suboptimal due to dietary indiscretion. Tolerating metformin and jardiance 10 mg well.  - Increase Jardiance to 25 mg daily - Continue metformin 1000 mg BID - Counseled on sick  day rules for Jardiance - Next A1C anticipated 09/23/2017 or later  #ASCVD risk - primary prevention in patient aged 47-75 with DM, baseline LDL 70-189 - moderate intensity statin indicated.  -Consider statin at next  visit  #Hypertension longstanding currently uncontrolled but improved.  Patient reports adherence with medication. Control is suboptimal due to dietary sodium, obesity. - Continue amlodipine 10 mg nightly - Start lisinopril 5 mg daily - BMET in 7-10 days (f/u appt scheduled for 07/17/2017 for labs and BP recheck)  Written patient instructions provided.  Total time in face to face counseling 45 minutes.    Follow up in Pharmacist Clinic Visit 1 week.  Carlean Jews, Pharm.D., BCPS, CPP PGY2 Ambulatory Care Pharmacy Resident Phone: [phone]

## 2017-07-11 NOTE — Assessment & Plan Note (Signed)
 #  Hypertension longstanding currently uncontrolled but improved.  Patient reports adherence with medication. Control is suboptimal due to dietary sodium, obesity. - Continue amlodipine 10 mg nightly - Start lisinopril 5 mg daily - BMET in 7-10 days (f/u appt scheduled for 07/17/2017 for labs and BP recheck)

## 2017-07-11 NOTE — Assessment & Plan Note (Signed)
#  Diabetes longstanding currently uncontrolled per A1C but greatly improved per CBG report. Patient denies hypoglycemic events and is able to verbalize appropriate hypoglycemia management plan. Patient reports adherence with medication. Control is suboptimal due to dietary indiscretion. Tolerating metformin and jardiance 10 mg well.  - Increase Jardiance to 25 mg daily - Continue metformin 1000 mg BID - Counseled on sick day rules for Jardiance - Next A1C anticipated 09/23/2017 or later  #ASCVD risk - primary prevention in patient aged 92-75 with DM, baseline LDL 70-189 - moderate intensity statin indicated.  -Consider statin at next visit

## 2017-07-12 NOTE — Progress Notes (Signed)
  I have reviewed the above information and agree with above.   Christofer Shen, MD 

## 2017-07-13 ENCOUNTER — Other Ambulatory Visit: Payer: Self-pay | Admitting: Family Medicine

## 2017-07-16 NOTE — Progress Notes (Signed)
S:     Chief Complaint  Patient presents with  . Medication Management    DM, HTN    Patient arrives in good spirits, ambulating without assistance.  Presents for diabetes and hypertension evaluation, education, and management at the request of Dr. Caryl Bis (referred on 07/03/17). Recently went to the ED for hyperglycemia while on prednisone and metformin was started. Has seen Dr. Caryl Bis twice since then and during this time metformin was increased, Jardiance was started, and amlodipine was increased. Last Rx Clinic visit on 07/10/2017 - at that time Jardiance was increased and lisinopril was started.  Today patient reports some nausea initially but has now resolved with increased jardiance and addition of lisinopril. Denies HA, CP, dizziness, syncope, falls. Reports legs "feel better from the Raynauds". Got a new BP cuff and reports she has seen as low as 113/73 at home. Describes appropriate technique. Denies s/sx UTI/yeast infection. Stress continues to be decreased.   Patient reports Diabetes was diagnosed with first pregnancy (2002) controlled with diet/exercise, diagnosed with T2DM ~3 years ago.   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   Insurance coverage/medication affordability: BCBS [place] - affordable per patient.  Patient reports adherence with medications.  Current diabetes medications include: jardiance 25 mg daily, metformin 1000 mg BID Current hypertension medications include: amlodipine 10 mg daily, lisinopril 5 mg daily (AMs)  Goal weight = 150 lbs per patient  Patient denies hypoglycemic events.  Patient reported dietary habits: Eats 3 meals/day - continues to count carbs and try to eat fresh foods and reduce. Cut out processed meats, decreased frozen pizzzas.   Patient reported exercise habits: walking 5 times weekly   Patient reports nocturia. 1x nightly.  Patient denies pain/burning on urination.  Patient denies  neuropathy. Patient reports visual changes - improved Patient reports self foot exams. No issues.  O:  Physical Exam  Constitutional: She appears well-developed and well-nourished.     Review of Systems  Constitutional: Positive for weight loss.  Cardiovascular: Negative for chest pain.  Gastrointestinal: Negative for nausea and vomiting.  Neurological: Negative for dizziness, loss of consciousness and headaches.  All other systems reviewed and are negative.    Lab Results  Component Value Date   HGBA1C 11.4 (H) 06/26/2017   Vitals:   07/17/17 1522 07/17/17 1538  BP: (!) 142/88 138/90  Pulse:  72    Lipid Panel     Component Value Date/Time   CHOL 214 (H) 07/07/2015 1118   TRIG 320.0 (H) 07/07/2015 1118   HDL 37.80 (L) 07/07/2015 1118   CHOLHDL 6 07/07/2015 1118   VLDL 64.0 (H) 07/07/2015 1118   LDLDIRECT 130.0 07/07/2015 1118   GFR 98 (06/26/17) K 3.7 (06/26/17) Microalbumin, Urine 1.7 (06/26/2017) Last Vitamin D level 57 (07/07/2015) Last dental 2 years ago Last eye exam Feb 2019 per patient. AST 23(06/26/2017) ALT 24(06/26/2017) Alk Phos 88(06/26/2017)  CBGs - fastings 140s, 2hr ppbg 110s-150s, nighttime 140s-170s, denies 200s.    Clinical ASCVD: No   A/P: #Diabetes longstanding currently uncontrolled per A1C but greatly improved per CBG report. Patient denies hypoglycemic events and is able to verbalize appropriate hypoglycemia management plan. Patient reports adherence with medication. Control is improved 2/2 improved dietary habits, medication adherence, and improved exercise habits. Tolerating increase in jardiance well.  - Continue metformin 1000 mg BID, Jardiance 25 mg daily.  - Counseled on sick day rules for Jardiance - Next A1C anticipated 09/23/2017 or later  #ASCVD risk - primary  prevention in patient aged 47-75 with DM, baseline LDL 70-189 - moderate intensity statin indicated.   - Patient is a good candidate for statin therapy, consider at next  visit.  #Hypertension longstanding currently above goal but improved.  Patient reports adherence with medication. Control is improved 2/2 medication adherence, reduction in dietary sodium. Tolerating lisinopril well. - Continue amlodipine 10 mg nightly - Will review BMET results ASAP and call patient with changes to lisinopril. Anticipate increase to 10 mg and move to nighttime dosing. Will schedule follow up labs as needed.  Written patient instructions provided.  Total time in face to face counseling 20 minutes.    Follow up in Pharmacist Clinic Visit over the phone tomorrow, 1-2 weeks as needed.  Carlean Jews, Pharm.D., BCPS, CPP PGY2 Ambulatory Care Pharmacy Resident Phone: [phone]

## 2017-07-17 ENCOUNTER — Ambulatory Visit (INDEPENDENT_AMBULATORY_CARE_PROVIDER_SITE_OTHER): Payer: BLUE CROSS/BLUE SHIELD | Admitting: Pharmacist

## 2017-07-17 ENCOUNTER — Telehealth: Payer: Self-pay | Admitting: Radiology

## 2017-07-17 ENCOUNTER — Encounter: Payer: Self-pay | Admitting: Pharmacist

## 2017-07-17 ENCOUNTER — Other Ambulatory Visit (INDEPENDENT_AMBULATORY_CARE_PROVIDER_SITE_OTHER): Payer: BLUE CROSS/BLUE SHIELD

## 2017-07-17 VITALS — BP 138/90 | HR 72 | Ht 65.0 in | Wt 196.0 lb

## 2017-07-17 DIAGNOSIS — E611 Iron deficiency: Secondary | ICD-10-CM

## 2017-07-17 DIAGNOSIS — I10 Essential (primary) hypertension: Secondary | ICD-10-CM

## 2017-07-17 DIAGNOSIS — E559 Vitamin D deficiency, unspecified: Secondary | ICD-10-CM | POA: Diagnosis not present

## 2017-07-17 DIAGNOSIS — E119 Type 2 diabetes mellitus without complications: Secondary | ICD-10-CM | POA: Diagnosis not present

## 2017-07-17 NOTE — Assessment & Plan Note (Signed)
#  ASCVD risk - primary prevention in patient aged 47-75 with DM, baseline LDL 70-189 - moderate intensity statin indicated.   - Patient is a good candidate for statin therapy, consider at next visit.  #Hypertension longstanding currently above goal but improved.  Patient reports adherence with medication. Control is improved 2/2 medication adherence, reduction in dietary sodium. Tolerating lisinopril well. - Continue amlodipine 10 mg nightly - Will review BMET results ASAP and call patient with changes to lisinopril. Anticipate increase to 10 mg and move to nighttime dosing. Will schedule follow up labs as needed.

## 2017-07-17 NOTE — Telephone Encounter (Signed)
Pt came in for labs today and stated she wanted her VIT D drawn. Pt states she is also seeing you today. Advised pt I would see if you would be ok with me adding on Vit D lab to her blood work. Thank you

## 2017-07-17 NOTE — Assessment & Plan Note (Signed)
#  Diabetes longstanding currently uncontrolled per A1C but greatly improved per CBG report. Patient denies hypoglycemic events and is able to verbalize appropriate hypoglycemia management plan. Patient reports adherence with medication. Control is improved 2/2 improved dietary habits, medication adherence, and improved exercise habits. Tolerating increase in jardiance well.  - Continue metformin 1000 mg BID, Jardiance 25 mg daily.  - Counseled on sick day rules for Jardiance - Next A1C anticipated 09/23/2017 or later

## 2017-07-17 NOTE — Telephone Encounter (Signed)
This would need to come from Dr. Caryl Bis. Pt on vitamin D 5000 units daily which is more than typically needed to replete and she states she's been on it a while. Recommend drawing but need OK from Dr. Caryl Bis.

## 2017-07-17 NOTE — Patient Instructions (Addendum)
Thanks for coming to see me today! You have really been working hard. Great job!   1. Continue taking Jardiance 25 mg once a day in the mornings, metformin 1000 mg twice a day. Your blood sugar is looking GREAT! 2. Continue lisinopril 5 mg daily for now but switch dosing to nighttime. I will check on your kidneys and electrolytes tomorrow morning and call you with the plan for your lisinopril. I think you need just a little more medicine to get your blood pressure perfect.   We will make a follow up plan over the phone tomorrow.    Recipes: [email or web address]  Call if needed [phone]

## 2017-07-17 NOTE — Addendum Note (Signed)
Addended by: Arby Barrette on: 07/17/2017 02:44 PM   Modules accepted: Orders

## 2017-07-18 LAB — BASIC METABOLIC PANEL
BUN: 11 mg/dL (ref 7–25)
CO2: 23 mmol/L (ref 20–32)
Calcium: 9.6 mg/dL (ref 8.6–10.2)
Chloride: 100 mmol/L (ref 98–110)
Creat: 0.8 mg/dL (ref 0.50–1.10)
Glucose, Bld: 126 mg/dL — ABNORMAL HIGH (ref 65–99)
Potassium: 4.1 mmol/L (ref 3.5–5.3)
Sodium: 135 mmol/L (ref 135–146)

## 2017-07-18 NOTE — Progress Notes (Signed)
Called patient to follow up on BMET and adjust meds. SCr with ~17% increase and back to baseline. K wnl. Advised that she schedule lisinopril 5 mg qHS today and continue this week. Will call next Monday to assess BP readings at home. Plan to increase to 10 mg qHS if still uncontrolled. F/u appnt scheduled with me for 08/07/17 to discuss BP and DM.   Carlean Jews, Pharm.D., BCPS PGY2 Ambulatory Care Pharmacy Resident Phone: [phone]

## 2017-07-19 NOTE — Telephone Encounter (Signed)
Order placed.  Are we able to add this on?

## 2017-07-19 NOTE — Addendum Note (Signed)
Addended by: Leone Haven on: 07/19/2017 05:21 PM   Modules accepted: Orders

## 2017-07-20 NOTE — Telephone Encounter (Signed)
Will fax add on sheet to see if they will add test.

## 2017-07-20 NOTE — Addendum Note (Signed)
Addended by: Leeanne Rio on: 07/20/2017 12:19 PM   Modules accepted: Orders

## 2017-07-20 NOTE — Telephone Encounter (Signed)
Thanks

## 2017-07-21 LAB — CBC
HCT: 40.6 % (ref 35.0–45.0)
Hemoglobin: 13.3 g/dL (ref 11.7–15.5)
MCH: 26.3 pg — ABNORMAL LOW (ref 27.0–33.0)
MCHC: 32.8 g/dL (ref 32.0–36.0)
MCV: 80.4 fL (ref 80.0–100.0)
MPV: 12 fL (ref 7.5–12.5)
PLATELETS: 320 10*3/uL (ref 140–400)
RBC: 5.05 10*6/uL (ref 3.80–5.10)
RDW: 18.7 % — AB (ref 11.0–15.0)
WBC: 5.9 10*3/uL (ref 3.8–10.8)

## 2017-07-21 LAB — TEST AUTHORIZATION

## 2017-07-21 LAB — IRON,TIBC AND FERRITIN PANEL
%SAT: 17 % (calc) (ref 11–50)
FERRITIN: 25 ng/mL (ref 10–232)
IRON: 66 ug/dL (ref 40–190)
TIBC: 383 mcg/dL (calc) (ref 250–450)

## 2017-07-21 LAB — VITAMIN D 25 HYDROXY (VIT D DEFICIENCY, FRACTURES): Vit D, 25-Hydroxy: 48 ng/mL (ref 30–100)

## 2017-07-22 NOTE — Progress Notes (Signed)
I have reviewed the above note and agree. I was available to the pharmacist for consultation.  Minka Knight, MD  

## 2017-07-25 NOTE — Progress Notes (Signed)
Pt returned call. States CBGs have been in low 100s-180s at all times of day. Nothing <100 or >200. Denies any s/sx of UTi/yeast infection.   Reports BPs below:  130/86 - 8 PM 129/81 - 5:30 PM 131/87 - 7:50 PM 129/79 - 6:52 PM 144/83 - 12 AM 113/76 - 1:13 PM 122/79 - 9 PM  Denies dizziness, syncope, CP, falls. Confirms medication doses and lisinopril qHS dosing.   No change needed to medications at this time. Patient is likely taking BP at "highest" time of the day when lisinopril dose is wearing off. No further f/u needed with Rx clinic at this time. Patient has appointment scheduled with Dr. Caryl Bis in May, 2019.   Carlean Jews, Pharm.D., BCPS PGY2 Ambulatory Care Pharmacy Resident Phone: [phone]

## 2017-07-25 NOTE — Progress Notes (Signed)
Called patient 07/25/2017 6:25 PM to follow up on BP readings after moving lisinopril to qHS dosing. Called patient, no answer. Left HIPAA-compliant VM requesting she return my call. Pt has f/u appointment scheduled 3/25 with me.   Carlean Jews, Pharm.D., BCPS PGY2 Ambulatory Care Pharmacy Resident Phone: [phone]

## 2017-08-03 ENCOUNTER — Other Ambulatory Visit: Payer: BLUE CROSS/BLUE SHIELD

## 2017-08-07 ENCOUNTER — Ambulatory Visit: Payer: BLUE CROSS/BLUE SHIELD | Admitting: Pharmacist

## 2017-08-07 ENCOUNTER — Other Ambulatory Visit: Payer: BLUE CROSS/BLUE SHIELD

## 2017-09-05 ENCOUNTER — Other Ambulatory Visit: Payer: Self-pay | Admitting: Internal Medicine

## 2017-10-03 ENCOUNTER — Encounter: Payer: Self-pay | Admitting: Family Medicine

## 2017-10-03 ENCOUNTER — Ambulatory Visit (INDEPENDENT_AMBULATORY_CARE_PROVIDER_SITE_OTHER): Payer: BLUE CROSS/BLUE SHIELD

## 2017-10-03 ENCOUNTER — Ambulatory Visit: Payer: BLUE CROSS/BLUE SHIELD | Admitting: Family Medicine

## 2017-10-03 ENCOUNTER — Other Ambulatory Visit: Payer: Self-pay

## 2017-10-03 VITALS — BP 130/80 | HR 91 | Temp 98.8°F | Wt 187.6 lb

## 2017-10-03 DIAGNOSIS — I10 Essential (primary) hypertension: Secondary | ICD-10-CM

## 2017-10-03 DIAGNOSIS — K529 Noninfective gastroenteritis and colitis, unspecified: Secondary | ICD-10-CM | POA: Diagnosis not present

## 2017-10-03 DIAGNOSIS — E119 Type 2 diabetes mellitus without complications: Secondary | ICD-10-CM | POA: Diagnosis not present

## 2017-10-03 DIAGNOSIS — M79671 Pain in right foot: Secondary | ICD-10-CM | POA: Diagnosis not present

## 2017-10-03 DIAGNOSIS — S99921A Unspecified injury of right foot, initial encounter: Secondary | ICD-10-CM | POA: Diagnosis not present

## 2017-10-03 DIAGNOSIS — Z1231 Encounter for screening mammogram for malignant neoplasm of breast: Secondary | ICD-10-CM | POA: Diagnosis not present

## 2017-10-03 DIAGNOSIS — S99911A Unspecified injury of right ankle, initial encounter: Secondary | ICD-10-CM | POA: Diagnosis not present

## 2017-10-03 DIAGNOSIS — M25571 Pain in right ankle and joints of right foot: Secondary | ICD-10-CM

## 2017-10-03 DIAGNOSIS — R21 Rash and other nonspecific skin eruption: Secondary | ICD-10-CM

## 2017-10-03 DIAGNOSIS — E611 Iron deficiency: Secondary | ICD-10-CM | POA: Diagnosis not present

## 2017-10-03 DIAGNOSIS — Z1239 Encounter for other screening for malignant neoplasm of breast: Secondary | ICD-10-CM

## 2017-10-03 LAB — COMPREHENSIVE METABOLIC PANEL
ALK PHOS: 52 U/L (ref 39–117)
ALT: 15 U/L (ref 0–35)
AST: 13 U/L (ref 0–37)
Albumin: 4.6 g/dL (ref 3.5–5.2)
BILIRUBIN TOTAL: 0.8 mg/dL (ref 0.2–1.2)
BUN: 15 mg/dL (ref 6–23)
CO2: 25 meq/L (ref 19–32)
Calcium: 9.9 mg/dL (ref 8.4–10.5)
Chloride: 100 mEq/L (ref 96–112)
Creatinine, Ser: 0.79 mg/dL (ref 0.40–1.20)
GFR: 82.92 mL/min (ref >60.00)
GLUCOSE: 182 mg/dL — AB (ref 70–99)
Potassium: 4.3 mEq/L (ref 3.5–5.1)
Sodium: 137 mEq/L (ref 135–145)
TOTAL PROTEIN: 7.8 g/dL (ref 6.0–8.3)

## 2017-10-03 LAB — LIPID PANEL
CHOL/HDL RATIO: 5
Cholesterol: 176 mg/dL (ref 0–200)
HDL: 34.2 mg/dL — ABNORMAL LOW (ref >39.00)
NONHDL: 141.81
Triglycerides: 228 mg/dL — ABNORMAL HIGH (ref 0.0–149.0)
VLDL: 45.6 mg/dL — ABNORMAL HIGH (ref 0.0–40.0)

## 2017-10-03 LAB — LDL CHOLESTEROL, DIRECT: Direct LDL: 122 mg/dL

## 2017-10-03 LAB — HEMOGLOBIN A1C: Hgb A1c MFr Bld: 6.6 % — ABNORMAL HIGH (ref 4.6–6.5)

## 2017-10-03 MED ORDER — TRIAMCINOLONE ACETONIDE 0.1 % EX CREA: 1.0000 "application " | TOPICAL_CREAM | Freq: Two times a day (BID) | CUTANEOUS | 0 refills | Status: DC

## 2017-10-03 NOTE — Progress Notes (Signed)
[name], MD Phone: [phone]  Nicole Bryant is a 47 y.o. female who presents today for f/u.  CC: HTN, DM, right ankle pain  HYPERTENSION  Disease Monitoring  Home BP Monitoring not checking Chest pain- no    Dyspnea- no Medications  Compliance-  Taking amlodipine, lisinopril.   Edema- no  DIABETES Disease Monitoring: Blood Sugar ranges-110-143 fasting, 160-170 2 hr post prandial Polyuria/phagia/dipsia- no      Optho- UTD Medications: Compliance- taking jardiance, metformin Hypoglycemic symptoms- no  Found to be iron deficient previously based on the low MCV and then iron studies.  She has not been anemic.  She was not able to complete stool cards for [place].  Urine had no blood in it.  She reports colonoscopy and EGD about a year and a half ago with 2 polyps in the colon and several polyps in her stomach.  She responded well to the iron supplement.  She notes chronic diarrhea since having her gallbladder taken out.  She reports a rash on her elbows that has been intermittent over the years.  It has been a while since it flared up though flared up recently.  She has tried over-the-counter cream with no benefit.  She reports several weeks ago she slipped on a hot dog at the mall and since then her right foot and ankle has been hurting her.  She did not twist her ankle.  She wrapped it which was has beneficial.  Swelling has gone down some.  She was taking Aleve.     Social History   Tobacco Use  Smoking Status Never Smoker  Smokeless Tobacco Never Used     ROS see history of present illness  Objective  Physical Exam Vitals:   10/03/17 0823  BP: 130/80  Pulse: 91  Temp: 98.8 F (37.1 C)  SpO2: 96%    BP Readings from Last 3 Encounters:  10/03/17 130/80  07/17/17 138/90  07/10/17 (!) 146/98   Wt Readings from Last 3 Encounters:  10/03/17 187 lb 9.6 oz (85.1 kg)  07/17/17 196 lb (88.9 kg)  07/10/17 198 lb (89.8 kg)    Physical Exam    Constitutional: No distress.  Cardiovascular: Normal rate, regular rhythm and normal heart sounds.  Pulmonary/Chest: Effort normal and breath sounds normal.  Musculoskeletal: She exhibits no edema.  Right ankle with tenderness over the lateral malleolus, medial malleolus with no tenderness, navicular with tenderness, no fifth head of metatarsal tenderness, tenderness over the dorsum of the midfoot, 2+ DP and PT pulses on the right  Neurological: She is alert.  Skin: Skin is warm and dry. She is not diaphoretic.  Slight hyperkeratotic rash over bilateral elbows with some flaking of skin     Assessment/Plan: Please see individual problem list.  Hypertension At goal.  Continue current regimen.  Non-insulin dependent type 2 diabetes mellitus (HCC) Check A1c.  Continue current regimen.  Iron deficiency Iron deficient with no anemia.  She will continue iron supplementation.  We will go ahead and refer to GI given her chronic diarrhea as I suspect that is playing a role.  She was unable to complete stool cards though she had recently had an EGD and colonoscopy.  Acute right ankle pain Suspect strain or bruising though given persistence we will x-ray.  She will ice, elevate, and use compression wrapping.  Rash Suspect eczema.  Treat with topical triamcinolone.  If not improving refer to dermatology.   Health Maintenance: Patient will schedule her own mammogram at her  request.  We will request her Pap smear results.  Orders Placed This Encounter  Procedures  . MM 3D SCREEN BREAST BILATERAL    Standing Status:   Future    Standing Expiration Date:   12/04/2018    Order Specific Question:   Reason for Exam (SYMPTOM  OR DIAGNOSIS REQUIRED)    Answer:   screening    Order Specific Question:   Is the patient pregnant?    Answer:   No    Order Specific Question:   Preferred imaging location?    Answer:   External  . DG Ankle Complete Right    Standing Status:   Future    Number of  Occurrences:   1    Standing Expiration Date:   12/04/2018    Order Specific Question:   Reason for Exam (SYMPTOM  OR DIAGNOSIS REQUIRED)    Answer:   right ankle and foot injury 3 weeks ago, tender over lateral maleolus and navicular    Order Specific Question:   Is patient pregnant?    Answer:   No    Order Specific Question:   Preferred imaging location?    Answer:   [facility] Specific Question:   Radiology Contrast Protocol - do NOT remove file path    Answer:   _0 [email or web address].[email or web address]  . DG Foot Complete Right    Standing Status:   Future    Number of Occurrences:   1    Standing Expiration Date:   12/04/2018    Order Specific Question:   Reason for Exam (SYMPTOM  OR DIAGNOSIS REQUIRED)    Answer:   right ankle and foot injury 3 weeks ago, tender over lateral maleolus and navicular    Order Specific Question:   Is patient pregnant?    Answer:   No    Order Specific Question:   Preferred imaging location?    Answer:   [facility] Specific Question:   Radiology Contrast Protocol - do NOT remove file path    Answer:   _1 [email or web address]s.pdf  . HgB A1c  . Lipid panel  . Comp Met (CMET)  . Ambulatory referral to Gastroenterology    Referral Priority:   Routine    Referral Type:   Consultation    Referral Reason:   Specialty Services Required    Number of Visits Requested:   1    Meds ordered this encounter  Medications  . triamcinolone cream (KENALOG) 0.1 %    Sig: Apply 1 application topically 2 (two) times daily.    Dispense:  30 g    Refill:  0     [name], MD [place]

## 2017-10-03 NOTE — Assessment & Plan Note (Addendum)
Suspect strain or bruising though given persistence we will x-ray.  She will ice, elevate, and use compression wrapping.

## 2017-10-03 NOTE — Assessment & Plan Note (Signed)
Check A1c.  Continue current regimen. 

## 2017-10-03 NOTE — Assessment & Plan Note (Signed)
Suspect eczema.  Treat with topical triamcinolone.  If not improving refer to dermatology.

## 2017-10-03 NOTE — Patient Instructions (Addendum)
Nice to see you. We will check labs and get an x-ray today and contact you with the results. We will request her Pap smear results. We will get you to see GI to evaluate your chronic diarrhea and iron deficiency.

## 2017-10-03 NOTE — Assessment & Plan Note (Signed)
At goal. Continue current regimen. 

## 2017-10-03 NOTE — Assessment & Plan Note (Signed)
Iron deficient with no anemia.  She will continue iron supplementation.  We will go ahead and refer to GI given her chronic diarrhea as I suspect that is playing a role.  She was unable to complete stool cards though she had recently had an EGD and colonoscopy.

## 2017-10-31 ENCOUNTER — Other Ambulatory Visit: Payer: Self-pay | Admitting: Family Medicine

## 2017-11-07 ENCOUNTER — Other Ambulatory Visit: Payer: Self-pay | Admitting: Family Medicine

## 2017-11-07 ENCOUNTER — Other Ambulatory Visit: Payer: Self-pay | Admitting: Internal Medicine

## 2017-11-15 ENCOUNTER — Encounter: Payer: Self-pay | Admitting: *Deleted

## 2017-11-21 ENCOUNTER — Encounter: Payer: Self-pay | Admitting: Family Medicine

## 2017-12-07 ENCOUNTER — Other Ambulatory Visit: Payer: Self-pay | Admitting: Family Medicine

## 2017-12-11 ENCOUNTER — Ambulatory Visit: Payer: BLUE CROSS/BLUE SHIELD | Admitting: Internal Medicine

## 2018-01-05 ENCOUNTER — Other Ambulatory Visit: Payer: Self-pay | Admitting: Family Medicine

## 2018-01-12 ENCOUNTER — Ambulatory Visit: Payer: BLUE CROSS/BLUE SHIELD | Admitting: Family Medicine

## 2018-02-06 ENCOUNTER — Ambulatory Visit: Payer: BLUE CROSS/BLUE SHIELD | Admitting: Internal Medicine

## 2018-03-12 ENCOUNTER — Other Ambulatory Visit: Payer: Self-pay | Admitting: Family Medicine

## 2018-03-14 ENCOUNTER — Ambulatory Visit: Payer: BLUE CROSS/BLUE SHIELD | Admitting: Internal Medicine

## 2018-04-04 ENCOUNTER — Other Ambulatory Visit: Payer: Self-pay | Admitting: Family Medicine

## 2018-05-02 ENCOUNTER — Encounter: Payer: Self-pay | Admitting: Family Medicine

## 2018-05-02 ENCOUNTER — Ambulatory Visit: Payer: BLUE CROSS/BLUE SHIELD | Admitting: Family Medicine

## 2018-05-02 VITALS — BP 130/98 | HR 85 | Temp 98.4°F | Ht 65.0 in | Wt 191.6 lb

## 2018-05-02 DIAGNOSIS — E119 Type 2 diabetes mellitus without complications: Secondary | ICD-10-CM | POA: Diagnosis not present

## 2018-05-02 DIAGNOSIS — N926 Irregular menstruation, unspecified: Secondary | ICD-10-CM | POA: Insufficient documentation

## 2018-05-02 DIAGNOSIS — E7800 Pure hypercholesterolemia, unspecified: Secondary | ICD-10-CM

## 2018-05-02 DIAGNOSIS — I10 Essential (primary) hypertension: Secondary | ICD-10-CM | POA: Diagnosis not present

## 2018-05-02 DIAGNOSIS — K529 Noninfective gastroenteritis and colitis, unspecified: Secondary | ICD-10-CM

## 2018-05-02 DIAGNOSIS — Z23 Encounter for immunization: Secondary | ICD-10-CM

## 2018-05-02 DIAGNOSIS — E611 Iron deficiency: Secondary | ICD-10-CM | POA: Diagnosis not present

## 2018-05-02 DIAGNOSIS — E785 Hyperlipidemia, unspecified: Secondary | ICD-10-CM

## 2018-05-02 LAB — COMPREHENSIVE METABOLIC PANEL
ALK PHOS: 58 U/L (ref 39–117)
ALT: 15 U/L (ref 0–35)
AST: 14 U/L (ref 0–37)
Albumin: 4.8 g/dL (ref 3.5–5.2)
BUN: 14 mg/dL (ref 6–23)
CO2: 22 mEq/L (ref 19–32)
Calcium: 9.8 mg/dL (ref 8.4–10.5)
Chloride: 103 mEq/L (ref 96–112)
Creatinine, Ser: 0.81 mg/dL (ref 0.40–1.20)
GFR: 80.37 mL/min (ref >60.00)
Glucose, Bld: 163 mg/dL — ABNORMAL HIGH (ref 70–99)
POTASSIUM: 4.1 meq/L (ref 3.5–5.1)
SODIUM: 138 meq/L (ref 135–145)
Total Bilirubin: 0.9 mg/dL (ref 0.2–1.2)
Total Protein: 7.9 g/dL (ref 6.0–8.3)

## 2018-05-02 LAB — CBC
HCT: 49.6 % — ABNORMAL HIGH (ref 36.0–46.0)
Hemoglobin: 16.5 g/dL — ABNORMAL HIGH (ref 12.0–15.0)
MCHC: 33.3 g/dL (ref 30.0–36.0)
MCV: 92.3 fl (ref 78.0–100.0)
Platelets: 232 10*3/uL (ref 150.0–400.0)
RBC: 5.38 Mil/uL — ABNORMAL HIGH (ref 3.87–5.11)
RDW: 13.6 % (ref 11.5–15.5)
WBC: 7.9 10*3/uL (ref 4.0–10.5)

## 2018-05-02 LAB — IRON,TIBC AND FERRITIN PANEL
%SAT: 63 % — AB (ref 16–45)
Ferritin: 59 ng/mL (ref 16–232)
IRON: 233 ug/dL — AB (ref 40–190)
TIBC: 368 ug/dL (ref 250–450)

## 2018-05-02 LAB — LDL CHOLESTEROL, DIRECT: Direct LDL: 142 mg/dL

## 2018-05-02 LAB — POCT URINE PREGNANCY: Preg Test, Ur: NEGATIVE

## 2018-05-02 LAB — TSH: TSH: 2.28 u[IU]/mL (ref 0.35–4.50)

## 2018-05-02 LAB — HEMOGLOBIN A1C: Hgb A1c MFr Bld: 6.4 % (ref 4.6–6.5)

## 2018-05-02 NOTE — Patient Instructions (Addendum)
Nice to see you. Please contact your gynecologist for yearly follow-up. We will check lab work today and contact you with the results. You may be in perimenopause.  You should see your gynecologist for evaluation. Please get a blood pressure cuff that goes over your upper arm to check your blood pressure at home.  Omron is a good brand.  You do not need to get a fancy blood pressure cuff.

## 2018-05-02 NOTE — Assessment & Plan Note (Signed)
Mildly elevated today though seems to have been decently well controlled previously.  She will buy a blood pressure cuff and start checking at home.  She will return in 2 weeks for recheck.

## 2018-05-02 NOTE — Assessment & Plan Note (Signed)
This may represent perimenopause.  We will check a urine pregnancy test.  Check TSH.  We will have her follow-up with GYN.

## 2018-05-02 NOTE — Assessment & Plan Note (Signed)
Check A1c.  Seems to be adequately controlled based on home CBGs.  Continue current regimen.

## 2018-05-02 NOTE — Assessment & Plan Note (Signed)
Check iron levels and CBC.  Continue iron supplementation.  We will get her referred back to GI.  Discussed potential for further work-up through GI.

## 2018-05-02 NOTE — Progress Notes (Signed)
[name], MD Phone: [phone]  Nicole Bryant is a 47 y.o. female who presents today for f/u.  CC: htn, dm, iron deficiency, missed period  HYPERTENSION  Disease Monitoring  Home BP Monitoring not checking Chest pain- no    Dyspnea- no Medications  Compliance-  Taking lisinopril, amlodipine.   Edema- no  DIABETES Disease Monitoring: Blood Sugar ranges-131 fasting, 160-165 2 hr postprandial Polyuria/phagia/dipsia- no      Optho- UTD  Medications: Compliance- taking jardiance, metformin Hypoglycemic symptoms- occasional, eats and improves  Iron deficiency: Patient has not seen GI yet.  She notes she was unable to schedule the appointment due to her schedule.  She notes no heavy menses.  She notes she does continue to have some diarrhea though it is not as bad as previously.  It is dark related to taking iron though no melena.  No blood in her stool.  She has had an EGD and colonoscopy 2 years ago.  Missed period: Patient with 60 days without a menstrual cycle.  Then she had a light period lasting 1 to 2 days.  She notes no bleeding now.  She is not sexually active.  In the past she has had one menstrual cycle monthly lasting 3 to 4 days.  She notes no vision changes or headaches.     Social History   Tobacco Use  Smoking Status Never Smoker  Smokeless Tobacco Never Used     ROS see history of present illness  Objective  Physical Exam Vitals:   05/02/18 0857 05/02/18 0915  BP: 124/86 (!) 130/98  Pulse: 85   Temp: 98.4 F (36.9 C)   SpO2: 98%     BP Readings from Last 3 Encounters:  05/02/18 (!) 130/98  10/03/17 130/80  07/17/17 138/90   Wt Readings from Last 3 Encounters:  05/02/18 191 lb 9.6 oz (86.9 kg)  10/03/17 187 lb 9.6 oz (85.1 kg)  07/17/17 196 lb (88.9 kg)    Physical Exam Constitutional:      General: She is not in acute distress.    Appearance: She is not diaphoretic.  Cardiovascular:     Rate and Rhythm: Normal rate and  regular rhythm.     Heart sounds: Normal heart sounds.  Pulmonary:     Effort: Pulmonary effort is normal.     Breath sounds: Normal breath sounds.  Skin:    General: Skin is warm and dry.  Neurological:     Mental Status: She is alert.      Assessment/Plan: Please see individual problem list.  Hypertension Mildly elevated today though seems to have been decently well controlled previously.  She will buy a blood pressure cuff and start checking at home.  She will return in 2 weeks for recheck.  Non-insulin dependent type 2 diabetes mellitus (HCC) Check A1c.  Seems to be adequately controlled based on home CBGs.  Continue current regimen.  Iron deficiency Check iron levels and CBC.  Continue iron supplementation.  We will get her referred back to GI.  Discussed potential for further work-up through GI.  Missed period This may represent perimenopause.  We will check a urine pregnancy test.  Check TSH.  We will have her follow-up with GYN.   Health Maintenance: Tdap and Pneumovax given today.  Orders Placed This Encounter  Procedures  . Pneumococcal polysaccharide vaccine 23-valent greater than or equal to 2yo subcutaneous/IM  . Tdap vaccine greater than or equal to 7yo IM  . Iron, TIBC and Ferritin Panel  .  CBC  . TSH  . HgB A1c  . Direct LDL  . Comp Met (CMET)  . Ambulatory referral to Gastroenterology    Referral Priority:   Routine    Referral Type:   Consultation    Referral Reason:   Specialty Services Required    Number of Visits Requested:   1  . POCT urine pregnancy    No orders of the defined types were placed in this encounter.    [name], MD [place]

## 2018-05-03 ENCOUNTER — Other Ambulatory Visit: Payer: Self-pay | Admitting: Family Medicine

## 2018-05-03 DIAGNOSIS — D582 Other hemoglobinopathies: Secondary | ICD-10-CM

## 2018-05-04 ENCOUNTER — Ambulatory Visit: Payer: Self-pay | Admitting: *Deleted

## 2018-05-04 NOTE — Telephone Encounter (Signed)
Sent to PCP to advise. Thanks  

## 2018-05-04 NOTE — Telephone Encounter (Signed)
We will determine whether or not to stop the iron after we repeat her hemoglobin test.  If it is still elevated she will need to discontinue the iron and have her labs rechecked about a month later.

## 2018-05-04 NOTE — Telephone Encounter (Signed)
Summary: does she need to continue iron supplements?   Patient calling and states that she just received her lab results from [place]. States that once she was off the phone she had thought of a question. Would like to know if she needs to continue taking her iron supplements? Please advise. Would like a call to discuss today.      Reason for Disposition . [1] Caller requesting NON-URGENT health information AND [2] PCP's office is the best resource  Answer Assessment - Initial Assessment Questions 1. REASON FOR CALL or QUESTION: "What is your reason for calling today?" or "How can I best help you?" or "What question do you have that I can help answer?"     Patient needs to know if she needs to continue the iron suppliments  Protocols used: INFORMATION ONLY CALL-A-AH

## 2018-05-07 ENCOUNTER — Other Ambulatory Visit: Payer: Self-pay | Admitting: Family Medicine

## 2018-05-07 ENCOUNTER — Other Ambulatory Visit: Payer: Self-pay | Admitting: Internal Medicine

## 2018-05-07 MED ORDER — TRIAMCINOLONE ACETONIDE 0.1 % EX CREA: 1.0000 "application " | TOPICAL_CREAM | Freq: Two times a day (BID) | CUTANEOUS | 0 refills | Status: DC

## 2018-05-07 MED ORDER — ALBUTEROL SULFATE HFA 108 (90 BASE) MCG/ACT IN AERS: 2.0000 | INHALATION_SPRAY | Freq: Four times a day (QID) | RESPIRATORY_TRACT | 0 refills | Status: DC | PRN

## 2018-05-07 NOTE — Telephone Encounter (Signed)
Called and spoke with patient. Pt advised and voiced understanding.  

## 2018-05-10 ENCOUNTER — Other Ambulatory Visit (INDEPENDENT_AMBULATORY_CARE_PROVIDER_SITE_OTHER): Payer: BLUE CROSS/BLUE SHIELD

## 2018-05-10 DIAGNOSIS — D582 Other hemoglobinopathies: Secondary | ICD-10-CM

## 2018-05-10 LAB — CBC
HCT: 47.3 % — ABNORMAL HIGH (ref 36.0–46.0)
Hemoglobin: 15.7 g/dL — ABNORMAL HIGH (ref 12.0–15.0)
MCHC: 33.2 g/dL (ref 30.0–36.0)
MCV: 91.8 fl (ref 78.0–100.0)
Platelets: 256 10*3/uL (ref 150.0–400.0)
RBC: 5.15 Mil/uL — ABNORMAL HIGH (ref 3.87–5.11)
RDW: 13.8 % (ref 11.5–15.5)
WBC: 6.7 10*3/uL (ref 4.0–10.5)

## 2018-05-11 ENCOUNTER — Other Ambulatory Visit: Payer: Self-pay | Admitting: Family Medicine

## 2018-05-11 DIAGNOSIS — D582 Other hemoglobinopathies: Secondary | ICD-10-CM

## 2018-05-15 ENCOUNTER — Ambulatory Visit: Payer: BLUE CROSS/BLUE SHIELD

## 2018-05-15 ENCOUNTER — Encounter: Payer: Self-pay | Admitting: Family Medicine

## 2018-05-31 ENCOUNTER — Other Ambulatory Visit: Payer: Self-pay | Admitting: Family Medicine

## 2018-06-07 ENCOUNTER — Ambulatory Visit: Payer: BLUE CROSS/BLUE SHIELD | Admitting: Family Medicine

## 2018-06-07 ENCOUNTER — Encounter: Payer: Self-pay | Admitting: Family Medicine

## 2018-06-07 VITALS — BP 146/92 | HR 90 | Temp 98.8°F | Resp 16 | Ht 65.0 in | Wt 191.0 lb

## 2018-06-07 DIAGNOSIS — H6692 Otitis media, unspecified, left ear: Secondary | ICD-10-CM

## 2018-06-07 MED ORDER — AMOXICILLIN-POT CLAVULANATE 875-125 MG PO TABS: 1.0000 | ORAL_TABLET | Freq: Two times a day (BID) | ORAL | 0 refills | Status: DC

## 2018-06-07 NOTE — Progress Notes (Signed)
Subjective:    Patient ID: Nicole Bryant, female    DOB: November 30, 1970, 48 y.o.   MRN: [id number]  HPI   Patient presents to clinic complaining of pain and fullness in left ear.  Patient states this pain has been slowly building for 2 weeks.  States last week did a E visit through her [employer] and was advised to take Sudafed in hopes of decongesting.  Patient has high blood pressure, so Sudafed has made her blood pressure elevated.  Last dose of Sudafed was at 9 AM today, she has been using the every 6 hour dosing.  She also consistently uses her Flonase and cetirizine daily.  Patient denies any fever or chills.  Denies hearing loss.  Denies sinus pain or drainage.  Denies nausea/vomiting or diarrhea.  Denies shortness of breath or wheezing.  Patient Active Problem List   Diagnosis Date Noted  . Missed period 05/02/2018  . Acute right ankle pain 10/03/2017  . Rash 10/03/2017  . Hypertension 06/26/2017  . Iron deficiency 06/26/2017  . Sleeping difficulty 12/29/2015  . Non-insulin dependent type 2 diabetes mellitus ([place])   . Hyperlipidemia 08/03/2015   Social History   Tobacco Use  . Smoking status: Never Smoker  . Smokeless tobacco: Never Used  Substance Use Topics  . Alcohol use: No    Alcohol/week: 0.0 standard drinks   Review of Systems  Constitutional: Negative for chills, fatigue and fever.  HENT: +ear pain, ear congestion. No sinus pain or sore throat.  Eyes: Negative.   Respiratory: Negative for cough, shortness of breath and wheezing.   Cardiovascular: Negative for chest pain, palpitations and leg swelling.  Gastrointestinal: Negative for abdominal pain, diarrhea, nausea and vomiting.  Genitourinary: Negative for dysuria, frequency and urgency.  Musculoskeletal: Negative for arthralgias and myalgias.  Skin: Negative for color change, pallor and rash.  Neurological: Negative for syncope, light-headedness and headaches.    Psychiatric/Behavioral: The patient is not nervous/anxious.       Objective:   Physical Exam Vitals signs and nursing note reviewed.  Constitutional:      General: She is not in acute distress.    Appearance: She is not toxic-appearing or diaphoretic.  HENT:     Head: Normocephalic and atraumatic.     Right Ear: Hearing, ear canal and external ear normal. A middle ear effusion is present.     Left Ear: Hearing, ear canal and external ear normal. Tenderness present. A middle ear effusion is present. Tympanic membrane is injected, erythematous and bulging.     Ears:     Comments: Mild clear nasal drainage present.     Mouth/Throat:     Mouth: Mucous membranes are moist.  Eyes:     Conjunctiva/sclera: Conjunctivae normal.  Neck:     Musculoskeletal: Neck supple. No neck rigidity.  Cardiovascular:     Rate and Rhythm: Normal rate and regular rhythm.  Pulmonary:     Effort: Pulmonary effort is normal. No respiratory distress.     Breath sounds: Normal breath sounds. No wheezing or rales.  Lymphadenopathy:     Cervical: No cervical adenopathy.  Skin:    General: Skin is warm and dry.     Coloration: Skin is not pale.  Neurological:     Mental Status: She is alert and oriented to person, place, and time.  Psychiatric:        Mood and Affect: Mood normal.        Behavior: Behavior normal.  Vitals:   06/07/18 1432 06/07/18 1442  BP: (!) 158/108 (!) 146/92  Pulse: (!) 108 90  Resp: 16   Temp: 98.8 F (37.1 C)   SpO2: 98%       Assessment & Plan:   Left otitis media- patient's exam and symptoms are considered with ear infection.  Patient will take Augmentin twice daily for 10 days to treat this.  Advised to stop taking Sudafed as it seems to be affecting her blood pressure.  Encouraged to continue doing her cetirizine and Flonase to help keep allergy type symptoms under control.  Offered short course of steroids, but patient states steroids really affect her diabetes and  make her blood sugars go sky high, so she politely declined.  Patient will keep regularly scheduled follow-up with PCP as planned.  Advised to return to clinic sooner if any issues arise.

## 2018-06-20 ENCOUNTER — Encounter: Payer: Self-pay | Admitting: Family Medicine

## 2018-06-21 ENCOUNTER — Other Ambulatory Visit: Payer: Self-pay | Admitting: Family Medicine

## 2018-06-21 ENCOUNTER — Ambulatory Visit: Payer: Self-pay | Admitting: *Deleted

## 2018-06-21 DIAGNOSIS — H6692 Otitis media, unspecified, left ear: Secondary | ICD-10-CM

## 2018-06-21 MED ORDER — AZITHROMYCIN 250 MG PO TABS: ORAL_TABLET | ORAL | 0 refills | Status: DC

## 2018-06-21 NOTE — Telephone Encounter (Signed)
Summary: ear ache    Pt states she finshed the antibiotic for her left ear infection on Sunday. Her ear is still feeling stuffy and she thinks she may need another round of antibiotics. Please advise.

## 2018-06-21 NOTE — Telephone Encounter (Signed)
I sent you a my chart on this patient also says her symptoms are still occurring from visit.

## 2018-06-21 NOTE — Telephone Encounter (Signed)
Patient is calling to report reoccurrence of symptoms- she is requesting re treatment. Call to office- will send request for review by PCP. Reason for Disposition . [1] Taking antibiotic > 72 hours (3 days) and [2] pain persists or recurs    Patient finished antibiotic on Sunday- patient is calling to report she is having the same symptoms in the same ear. Pressure and discomfort.  Answer Assessment - Initial Assessment Questions 1. ANTIBIOTIC: "What antibiotic are you receiving?" "How many times per day?"     Amoxicillin - bid for 10 days- finished Sunday 2. ONSET: "When was the antibiotic started?"     06/07/18 3. PAIN: "How bad is the pain?"   (Scale 1-10; mild, moderate or severe)   - MILD (1-3): doesn't interfere with normal activities    - MODERATE (4-7): interferes with normal activities or awakens from sleep    - SEVERE (8-10): excruciating pain, unable to do any normal activities      3-4 4. FEVER: "Do you have a fever?" If so, ask: "What is your temperature, how was it measured, and when did it start?"     Unknown- has not checked 5. DISCHARGE: "Is there any discharge from the ear?"     no 6. OTHER SYMPTOMS: "Do you have any other symptoms?" (e.g., headache, stiff neck, dizziness, vomiting, runny nose)     Little headache, discomfort from left ear 7. PREGNANCY: "Is there any chance you are pregnant?" "When was your last menstrual period?"     No- LMP- 05/31/18  Protocols used: EAR - OTITIS MEDIA FOLLOW-UP CALL-A-AH

## 2018-07-04 ENCOUNTER — Other Ambulatory Visit: Payer: Self-pay | Admitting: Family Medicine

## 2018-07-06 ENCOUNTER — Telehealth: Payer: Self-pay | Admitting: Family Medicine

## 2018-07-06 ENCOUNTER — Encounter: Payer: Self-pay | Admitting: Family Medicine

## 2018-07-06 NOTE — Telephone Encounter (Signed)
Please call the patient and let her know GI has been trying to contact her.  Please give her the phone number to call to set up her appointment. [phone]

## 2018-07-09 NOTE — Telephone Encounter (Signed)
Called and left a message on VM to call and set up a appt with GI.  Gae Bon, CMA

## 2018-07-14 ENCOUNTER — Other Ambulatory Visit: Payer: Self-pay | Admitting: Family Medicine

## 2018-07-18 ENCOUNTER — Encounter: Payer: Self-pay | Admitting: Family

## 2018-07-18 ENCOUNTER — Encounter: Payer: Self-pay | Admitting: Family Medicine

## 2018-07-20 ENCOUNTER — Ambulatory Visit: Payer: BLUE CROSS/BLUE SHIELD | Admitting: Family

## 2018-07-20 ENCOUNTER — Encounter: Payer: Self-pay | Admitting: Family

## 2018-07-20 VITALS — BP 122/70 | HR 108 | Temp 100.1°F | Wt 189.0 lb

## 2018-07-20 DIAGNOSIS — R509 Fever, unspecified: Secondary | ICD-10-CM

## 2018-07-20 DIAGNOSIS — J111 Influenza due to unidentified influenza virus with other respiratory manifestations: Secondary | ICD-10-CM

## 2018-07-20 LAB — POCT INFLUENZA A/B
Influenza A, POC: NEGATIVE
Influenza B, POC: NEGATIVE

## 2018-07-20 MED ORDER — OSELTAMIVIR PHOSPHATE 75 MG PO CAPS: 75.0000 mg | ORAL_CAPSULE | Freq: Two times a day (BID) | ORAL | 0 refills | Status: DC

## 2018-07-20 MED ORDER — BENZONATATE 100 MG PO CAPS: 100.0000 mg | ORAL_CAPSULE | Freq: Two times a day (BID) | ORAL | 0 refills | Status: DC | PRN

## 2018-07-20 NOTE — Progress Notes (Signed)
Subjective:    Patient ID: Nicole Bryant, female    DOB: [birth year], 48 y.o.   MRN: [id number]  CC: Nicole Bryant is a 48 y.o. female who presents today for an acute visit.    HPI: CC: Fever x1 day at home , unchanged. Abrupt onset  Chills, HA, body aches , cough.  Little congestion. Some wheezing, chest tightness. NO cp, sob, vision changes, neck pain.   T-max 101.  Has been using TheraFlu, albuterol with some relief.   Flu exposure at work.   No domestic or international travel  Never smoker. H/o asthma.    HISTORY:  Past Medical History:  Diagnosis Date  . Allergic rhinitis   . Chickenpox   . Chronic cholecystitis without calculus s/p lap cholecystectomy 02/04/2016 02/04/2016  . Diabetes ([place])    currently no meds- trying to control with diet  . Fatty liver   . Gastric polyps   . GERD (gastroesophageal reflux disease)   . Guaiac positive stools 08/03/2015  . Hyperlipemia   . Norovirus 07/2015  . Parathyroid abnormality ([place])   . Raynaud disease   . Tubular adenoma of colon   . UTI (lower urinary tract infection)    Past Surgical History:  Procedure Laterality Date  . BREAST CYST EXCISION    . LAPAROSCOPIC CHOLECYSTECTOMY SINGLE SITE WITH INTRAOPERATIVE CHOLANGIOGRAM N/A 02/04/2016   Procedure: LAPAROSCOPIC CHOLECYSTECTOMY SINGLE SITE WITH INTRAOPERATIVE CHOLANGIOGRAM AND STUDY OF BILE DUCTS;  Surgeon: Michael Boston, MD;  Location: [facility];  Service: General;  Laterality: N/A;  . PARATHYROIDECTOMY  2009  . TUBAL LIGATION  2008   Family History  Problem Relation Age of Onset  . Stroke Father   . Heart disease Maternal Grandfather   . Hypertension Paternal Aunt   . Diabetes Paternal Aunt   . Cancer Cousin        Peritoneal carcinoma  . Prostate cancer Other        great uncle  . Heart disease Paternal Grandmother   . Diverticulosis Mother   . GER disease Maternal Aunt   . Colon cancer Neg Hx     Allergies: Codeine and Sulfa  antibiotics Current Outpatient Medications on File Prior to Visit  Medication Sig Dispense Refill  . albuterol (PROVENTIL HFA;VENTOLIN HFA) 108 (90 Base) MCG/ACT inhaler Inhale 2 puffs into the lungs every 6 (six) hours as needed for wheezing or shortness of breath. 1 Inhaler 0  . amLODipine (NORVASC) 10 MG tablet Take 1 tablet (10 mg total) by mouth daily. 90 tablet 3  . Blood Glucose Monitoring Suppl (RELION CONFIRM GLUCOSE MONITOR) w/Device KIT Check your blood glucose once daily before breakfast 1 kit 0  . cetirizine (ZYRTEC ALLERGY) 10 MG tablet Take 10 mg by mouth daily.    . cholecalciferol (VITAMIN D) 1000 units tablet Take 5,000 Units by mouth daily.    . CONTOUR NEXT TEST test strip USE TO CHECK BLOOD SUGAR ONCE DAILY BEFORE BREAKFAST 100 each 12  . ferrous sulfate 325 (65 FE) MG tablet Take 1 tablet (325 mg total) by mouth 2 (two) times daily with a meal. Take with vitamin C. 30 tablet 1  . fluticasone (FLONASE) 50 MCG/ACT nasal spray USE 2 SPRAYS IN EACH NOSTRIL ONCE DAILY 16 g 2  . JARDIANCE 25 MG TABS tablet TAKE 1 TABLET BY MOUTH ONCE DAILY 90 tablet 1  . Lancet Devices (ONE TOUCH DELICA LANCING DEV) MISC   0  . lisinopril (PRINIVIL,ZESTRIL) 5 MG tablet TAKE 1 TABLET  BY MOUTH ONCE DAILY 30 tablet 1  . metFORMIN (GLUCOPHAGE) 500 MG tablet TAKE 2 TABLETS BY MOUTH TWICE A DAY WITHA MEAL 360 tablet 3  . metFORMIN (GLUCOPHAGE) 500 MG tablet TAKE 2 TABLETS BY MOUTH TWICE A DAY WITHA MEAL 120 tablet 0  . MICROLET LANCETS MISC CHECK YOUR BLOOD SUGAR ONCE DAILY BEFOREBREAKFAST 100 each 1  . omeprazole (PRILOSEC) 40 MG capsule TAKE ONE CAPSULE BY MOUTH TWICE DAILY BEFORE A MEAL 60 capsule 3  . Pyridoxine HCl (VITAMIN B6) 200 MG TABS Take 1 tablet by mouth daily.     Marland Kitchen triamcinolone cream (KENALOG) 0.1 % Apply 1 application topically 2 (two) times daily. 30 g 0   No current facility-administered medications on file prior to visit.     Social History   Tobacco Use  . Smoking status:  Never Smoker  . Smokeless tobacco: Never Used  Substance Use Topics  . Alcohol use: No    Alcohol/week: 0.0 standard drinks  . Drug use: No    Review of Systems  Constitutional: Positive for chills and fever.  HENT: Positive for congestion. Negative for sore throat and trouble swallowing.   Eyes: Negative for visual disturbance.  Respiratory: Positive for cough and wheezing. Negative for shortness of breath.   Cardiovascular: Negative for chest pain and palpitations.  Gastrointestinal: Negative for nausea and vomiting.  Musculoskeletal: Positive for myalgias.  Neurological: Positive for headaches.      Objective:    BP 122/70 (BP Location: Left Arm, Patient Position: Sitting, Cuff Size: Large)   Pulse (!) 108   Temp 100.1 F (37.8 C)   Wt 189 lb (85.7 kg)   SpO2 96%   BMI 31.45 kg/m    Physical Exam Vitals signs reviewed.  Constitutional:      Appearance: She is well-developed.  HENT:     Head: Normocephalic and atraumatic.     Right Ear: Hearing, tympanic membrane, ear canal and external ear normal. No decreased hearing noted. No drainage, swelling or tenderness. No middle ear effusion. No foreign body. Tympanic membrane is not erythematous or bulging.     Left Ear: Hearing, tympanic membrane, ear canal and external ear normal. No decreased hearing noted. No drainage, swelling or tenderness.  No middle ear effusion. No foreign body. Tympanic membrane is not erythematous or bulging.     Nose: Nose normal. No rhinorrhea.     Right Sinus: No maxillary sinus tenderness or frontal sinus tenderness.     Left Sinus: No maxillary sinus tenderness or frontal sinus tenderness.     Mouth/Throat:     Pharynx: Uvula midline. No oropharyngeal exudate or posterior oropharyngeal erythema.     Tonsils: No tonsillar abscesses.  Eyes:     Conjunctiva/sclera: Conjunctivae normal.  Cardiovascular:     Rate and Rhythm: Regular rhythm.     Pulses: Normal pulses.     Heart sounds: Normal  heart sounds.  Pulmonary:     Effort: Pulmonary effort is normal.     Breath sounds: Normal breath sounds. No wheezing, rhonchi or rales.  Lymphadenopathy:     Head:     Right side of head: No submental, submandibular, tonsillar, preauricular, posterior auricular or occipital adenopathy.     Left side of head: No submental, submandibular, tonsillar, preauricular, posterior auricular or occipital adenopathy.     Cervical: No cervical adenopathy.  Skin:    General: Skin is warm and dry.  Neurological:     Mental Status: She is alert.  Psychiatric:  Speech: Speech normal.        Behavior: Behavior normal.        Thought Content: Thought content normal.        Assessment & Plan:   1. Fever, unspecified fever cause  - POCT Influenza A/B - benzonatate (TESSALON) 100 MG capsule; Take 1 capsule (100 mg total) by mouth 2 (two) times daily as needed for cough.  Dispense: 20 capsule; Refill: 0 - oseltamivir (TAMIFLU) 75 MG capsule; Take 1 capsule (75 mg total) by mouth 2 (two) times daily.  Dispense: 10 capsule; Refill: 0  2. Influenza Suspected influenza.  Flu test in office was negative however presentation raises suspicion for influenza.  Nontoxic in appearance.  No adventitious lung sounds.  Patient will remain vigilant and certainly me know if any worsening of symptoms particular with her history of asthma.  - benzonatate (TESSALON) 100 MG capsule; Take 1 capsule (100 mg total) by mouth 2 (two) times daily as needed for cough.  Dispense: 20 capsule; Refill: 0 - oseltamivir (TAMIFLU) 75 MG capsule; Take 1 capsule (75 mg total) by mouth 2 (two) times daily.  Dispense: 10 capsule; Refill: 0    I have discontinued Gaye Alken. Barcenas's azithromycin. I am also having her start on benzonatate and oseltamivir. Additionally, I am having her maintain her cholecalciferol, Vitamin B6, ReliOn Confirm Glucose Monitor, ONE TOUCH DELICA LANCING DEV, ferrous sulfate, amLODipine, Contour  Next Test, Microlet Lancets, omeprazole, cetirizine, Jardiance, triamcinolone cream, albuterol, lisinopril, metFORMIN, metFORMIN, and fluticasone.   Meds ordered this encounter  Medications  . benzonatate (TESSALON) 100 MG capsule    Sig: Take 1 capsule (100 mg total) by mouth 2 (two) times daily as needed for cough.    Dispense:  20 capsule    Refill:  0    Order Specific Question:   Supervising Provider    Answer:   Deborra Medina L [[id number]]  . oseltamivir (TAMIFLU) 75 MG capsule    Sig: Take 1 capsule (75 mg total) by mouth 2 (two) times daily.    Dispense:  10 capsule    Refill:  0    Order Specific Question:   Supervising Provider    Answer:   Crecencio Mc [[id number]]    Return precautions given.   Risks, benefits, and alternatives of the medications and treatment plan prescribed today were discussed, and patient expressed understanding.   Education regarding symptom management and diagnosis given to patient on AVS.  Continue to follow with Leone Haven, MD for routine health maintenance.   Nicole Bryant and I agreed with plan.   Mable Paris, FNP

## 2018-07-20 NOTE — Patient Instructions (Signed)
Rest, hydration.     Please take Tamiflu.  Tessalon Perles as needed.  Please continue albuterol inhaler as you are doing. Let us know how you are doing.   Influenza, Adult Influenza is also called "the flu." It is an infection in the lungs, nose, and throat (respiratory tract). It is caused by a virus. The flu causes symptoms that are similar to symptoms of a cold. It also causes a high fever and body aches. The flu spreads easily from person to person (is contagious). Getting a flu shot (influenza vaccination) every year is the best way to prevent the flu. What are the causes? This condition is caused by the influenza virus. You can get the virus by:  Breathing in droplets that are in the air from the cough or sneeze of a person who has the virus.  Touching something that has the virus on it (is contaminated) and then touching your mouth, nose, or eyes. What increases the risk? Certain things may make you more likely to get the flu. These include:  Not washing your hands often.  Having close contact with many people during cold and flu season.  Touching your mouth, eyes, or nose without first washing your hands.  Not getting a flu shot every year. You may have a higher risk for the flu, along with serious problems such as a lung infection (pneumonia), if you:  Are older than 65.  Are pregnant.  Have a weakened disease-fighting system (immune system) because of a disease or taking certain medicines.  Have a long-term (chronic) illness, such as: ? Heart, kidney, or lung disease. ? Diabetes. ? Asthma.  Have a liver disorder.  Are very overweight (morbidly obese).  Have anemia. This is a condition that affects your red blood cells. What are the signs or symptoms? Symptoms usually begin suddenly and last 4-14 days. They may include:  Fever and chills.  Headaches, body aches, or muscle aches.  Sore throat.  Cough.  Runny or stuffy (congested) nose.  Chest  discomfort.  Not wanting to eat as much as normal (poor appetite).  Weakness or feeling tired (fatigue).  Dizziness.  Feeling sick to your stomach (nauseous) or throwing up (vomiting). How is this treated? If the flu is found early, you can be treated with medicine that can help reduce how bad the illness is and how long it lasts (antiviral medicine). This may be given by mouth (orally) or through an IV tube. Taking care of yourself at home can help your symptoms get better. Your doctor may suggest:  Taking over-the-counter medicines.  Drinking plenty of fluids. The flu often goes away on its own. If you have very bad symptoms or other problems, you may be treated in a hospital. Follow these instructions at home:     Activity  Rest as needed. Get plenty of sleep.  Stay home from work or school as told by your doctor. ? Do not leave home until you do not have a fever for 24 hours without taking medicine. ? Leave home only to visit your doctor. Eating and drinking  Take an ORS (oral rehydration solution). This is a drink that is sold at pharmacies and stores.  Drink enough fluid to keep your pee (urine) pale yellow.  Drink clear fluids in small amounts as you are able. Clear fluids include: ? Water. ? Ice chips. ? Fruit juice that has water added (diluted fruit juice). ? Low-calorie sports drinks.  Eat bland, easy-to-digest foods in small amounts  as you are able. These foods include: ? Bananas. ? Applesauce. ? Rice. ? Lean meats. ? Toast. ? Crackers.  Do not eat or drink: ? Fluids that have a lot of sugar or caffeine. ? Alcohol. ? Spicy or fatty foods. General instructions  Take over-the-counter and prescription medicines only as told by your doctor.  Use a cool mist humidifier to add moisture to the air in your home. This can make it easier for you to breathe.  Cover your mouth and nose when you cough or sneeze.  Wash your hands with soap and water often,  especially after you cough or sneeze. If you cannot use soap and water, use alcohol-based hand sanitizer.  Keep all follow-up visits as told by your doctor. This is important. How is this prevented?   Get a flu shot every year. You may get the flu shot in late summer, fall, or winter. Ask your doctor when you should get your flu shot.  Avoid contact with people who are sick during fall and winter (cold and flu season). Contact a doctor if:  You get new symptoms.  You have: ? Chest pain. ? Watery poop (diarrhea). ? A fever.  Your cough gets worse.  You start to have more mucus.  You feel sick to your stomach.  You throw up. Get help right away if you:  Have shortness of breath.  Have trouble breathing.  Have skin or nails that turn a bluish color.  Have very bad pain or stiffness in your neck.  Get a sudden headache.  Get sudden pain in your face or ear.  Cannot eat or drink without throwing up. Summary  Influenza ("the flu") is an infection in the lungs, nose, and throat. It is caused by a virus.  Take over-the-counter and prescription medicines only as told by your doctor.  Getting a flu shot every year is the best way to avoid getting the flu. This information is not intended to replace advice given to you by your health care provider. Make sure you discuss any questions you have with your health care provider. Document Released: 02/09/2008 Document Revised: 10/18/2017 Document Reviewed: 10/18/2017 Elsevier Interactive Patient Education  2019 [employer].

## 2018-07-23 ENCOUNTER — Encounter: Payer: Self-pay | Admitting: Family

## 2018-07-24 ENCOUNTER — Telehealth: Payer: Self-pay | Admitting: Family Medicine

## 2018-07-24 NOTE — Telephone Encounter (Signed)
Pt is calling and she is still running low grade temp around 99.3.  Pt would like to know if she should return back to work tomorrow.

## 2018-07-24 NOTE — Telephone Encounter (Signed)
I would suggest she stay out of work.  I will forward to [facility] to determine if she can prescribe anything additional for cough.

## 2018-07-24 NOTE — Telephone Encounter (Signed)
Copied from [place] [phone]. Topic: General - Other >> Jul 24, 2018 12:00 PM Keene Breath wrote: Reason for CRM: Patient called to request a stronger cough medicine.  She stated that the pearl pills are not helping and she is still coughing a lot at night.  Please advise and call back at [phone]

## 2018-07-24 NOTE — Telephone Encounter (Signed)
Patient saw Arnett DX with FLU on 07/20/18 asking for stronger cough medication that the cough Perrles are not working. NP out of office today.

## 2018-07-25 ENCOUNTER — Encounter: Payer: Self-pay | Admitting: Family Medicine

## 2018-07-25 NOTE — Telephone Encounter (Signed)
LMTCB. To inform patient of below. PEC may speak with patient & advise.

## 2018-07-25 NOTE — Telephone Encounter (Signed)
Yes, please give work  note

## 2018-07-25 NOTE — Telephone Encounter (Signed)
This encounter was created in error - please disregard.

## 2018-07-25 NOTE — Telephone Encounter (Signed)
Called &b patient will pick up work note. Note put upfront in folder.

## 2018-07-25 NOTE — Telephone Encounter (Signed)
Call patient Unfortunately, she is allergic to codeine, and this is the cough syrup  we typically use that is  "stronger" would include codeine such as Hycodan cough syrup.  I know she does not feel well however does appear her temperature may be coming down from 101 last week.  Please let her know to keep [place] posted on how she is doing, certainly if she thinks anything else we can do to help or if she feels need to be reevaluated altogether, please make an appt.

## 2018-07-25 NOTE — Telephone Encounter (Signed)
Message from M. Arnett read to patient; verbalizes understanding. Pt states Temp this AM 99.0 with tylenol. Pt requesting work note from Monday-Friday.

## 2018-07-28 ENCOUNTER — Other Ambulatory Visit: Payer: Self-pay | Admitting: Family Medicine

## 2018-07-30 ENCOUNTER — Other Ambulatory Visit: Payer: Self-pay | Admitting: Family Medicine

## 2018-07-31 ENCOUNTER — Other Ambulatory Visit: Payer: Self-pay | Admitting: Family Medicine

## 2018-08-27 ENCOUNTER — Telehealth: Payer: Self-pay

## 2018-08-27 NOTE — Telephone Encounter (Signed)
Copied from [place] [phone]. Topic: Quick Communication - Appointment Cancellation >> Aug 27, 2018  2:11 PM Valla Leaver wrote: Patient called to cancel appointment scheduled for 09/05/2018. Patient HAS NOT rescheduled their appointment. Will call back to r/s.  Route to department's PEC pool.

## 2018-08-29 ENCOUNTER — Telehealth: Payer: Self-pay

## 2018-08-29 NOTE — Telephone Encounter (Signed)
Copied from [place] [phone]. Topic: General - Other >> Aug 29, 2018  2:46 PM Ivar Drape wrote: Reason for CRM:   Patient stated she received a call asking if she wanted to do her 09/05/2018 appt virtually.  She stated she does not because she has been laid off of work and don't have the money for an office visit.  So she would like her 09/05/2018 appt cancelled and she will call back to reschedule the appt when things settle down.

## 2018-08-30 NOTE — Telephone Encounter (Signed)
Ok

## 2018-08-30 NOTE — Telephone Encounter (Signed)
Ok. Thank you.

## 2018-09-05 ENCOUNTER — Ambulatory Visit: Payer: BLUE CROSS/BLUE SHIELD | Admitting: Family Medicine

## 2018-09-07 ENCOUNTER — Encounter: Payer: Self-pay | Admitting: Family Medicine

## 2018-09-07 NOTE — Telephone Encounter (Signed)
Sent to PCP to advise 

## 2018-09-10 NOTE — Telephone Encounter (Signed)
Sent to covering provider to advise.

## 2018-09-25 ENCOUNTER — Other Ambulatory Visit: Payer: Self-pay | Admitting: Family Medicine

## 2018-10-05 ENCOUNTER — Other Ambulatory Visit: Payer: Self-pay | Admitting: Family Medicine

## 2018-10-25 ENCOUNTER — Other Ambulatory Visit: Payer: Self-pay | Admitting: Family Medicine

## 2018-12-01 ENCOUNTER — Other Ambulatory Visit: Payer: Self-pay | Admitting: Family Medicine

## 2018-12-20 ENCOUNTER — Other Ambulatory Visit: Payer: Self-pay | Admitting: Family Medicine

## 2019-01-19 ENCOUNTER — Other Ambulatory Visit: Payer: Self-pay | Admitting: Family Medicine

## 2019-01-30 ENCOUNTER — Telehealth: Payer: Self-pay

## 2019-01-30 ENCOUNTER — Other Ambulatory Visit: Payer: Self-pay | Admitting: Family Medicine

## 2019-01-30 NOTE — Telephone Encounter (Signed)
Patient was called and a voicemail was left informing the patient to call back  to schedule a followup appointment due to a refill request that came in and the patient has not been seen by provider more than 6 months, per protocol. the Rx refill was refused and sent to pharmacy stating patient needed an appointment.  Nina,cma

## 2019-02-13 ENCOUNTER — Other Ambulatory Visit: Payer: Self-pay | Admitting: Family Medicine

## 2019-02-20 ENCOUNTER — Other Ambulatory Visit: Payer: Self-pay

## 2019-02-22 ENCOUNTER — Ambulatory Visit (INDEPENDENT_AMBULATORY_CARE_PROVIDER_SITE_OTHER): Payer: BLUE CROSS/BLUE SHIELD | Admitting: Family Medicine

## 2019-02-22 ENCOUNTER — Encounter: Payer: Self-pay | Admitting: Family Medicine

## 2019-02-22 ENCOUNTER — Other Ambulatory Visit: Payer: Self-pay

## 2019-02-22 VITALS — BP 150/80 | HR 103 | Temp 97.3°F | Ht 64.0 in | Wt 194.6 lb

## 2019-02-22 DIAGNOSIS — E119 Type 2 diabetes mellitus without complications: Secondary | ICD-10-CM | POA: Diagnosis not present

## 2019-02-22 DIAGNOSIS — E785 Hyperlipidemia, unspecified: Secondary | ICD-10-CM

## 2019-02-22 DIAGNOSIS — E611 Iron deficiency: Secondary | ICD-10-CM | POA: Diagnosis not present

## 2019-02-22 DIAGNOSIS — Z23 Encounter for immunization: Secondary | ICD-10-CM

## 2019-02-22 DIAGNOSIS — I10 Essential (primary) hypertension: Secondary | ICD-10-CM

## 2019-02-22 NOTE — Progress Notes (Signed)
  [name], MD Phone: [phone]  Nicole Bryant is a 48 y.o. female who presents today for follow-up.  Hypertension: Not checking blood pressures at home.  Taking lisinopril.  Taking amlodipine.  No chest pain, shortness with, or edema.  Diabetes: Typically 120-130 fasting.  Taking metformin and Jardiance.  No polyuria or polydipsia.  No hypoglycemia.  She is due to see ophthalmology later this year.  Hyperlipidemia: She has not been on medication for this.  She does walk some when she mows her yard though no specific exercise.  She does eat some good food though also eats some bad food.  Some vegetables though she notes not enough.  No significant desserts though she does eat lots of carbs.  She has diet soda 1-2 times daily.  Iron deficiency: She stopped the iron.  She feels like her legs are little more achy since doing this.  Needs repeat lab work.  Social History   Tobacco Use  Smoking Status Never Smoker  Smokeless Tobacco Never Used     ROS see history of present illness  Objective  Physical Exam Vitals:   02/22/19 1551 02/22/19 1627  BP: 140/80 (!) 150/80  Pulse: (!) 103   Temp: (!) 97.3 F (36.3 C)     BP Readings from Last 3 Encounters:  02/22/19 (!) 150/80  07/20/18 122/70  06/07/18 (!) 146/92   Wt Readings from Last 3 Encounters:  02/22/19 194 lb 9.6 oz (88.3 kg)  07/20/18 189 lb (85.7 kg)  06/07/18 191 lb (86.6 kg)    Physical Exam Constitutional:      General: She is not in acute distress.    Appearance: She is not diaphoretic.  Cardiovascular:     Rate and Rhythm: Normal rate and regular rhythm.     Heart sounds: Normal heart sounds.  Pulmonary:     Effort: Pulmonary effort is normal.     Breath sounds: Normal breath sounds.  Musculoskeletal:     Right lower leg: No edema.     Left lower leg: No edema.  Skin:    General: Skin is warm and dry.  Neurological:     Mental Status: She is alert.    Diabetic Foot Exam -  Simple   Simple Foot Form Diabetic Foot exam was performed with the following findings: Yes 02/22/2019  4:11 PM  Visual Inspection No deformities, no ulcerations, no other skin breakdown bilaterally: Yes Sensation Testing Intact to touch and monofilament testing bilaterally: Yes Pulse Check Posterior Tibialis and Dorsalis pulse intact bilaterally: Yes Comments      Assessment/Plan: Please see individual problem list.  Hypertension Above goal though she does not check at home.  She will buy a cuff and start checking and let us know what her blood pressures running over the next 1 to 2 weeks.  If above goal we will add additional medication.  She will continue her current regimen at this time.  Non-insulin dependent type 2 diabetes mellitus ([place]) Check lab work.  Continue current regimen.  Iron deficiency Recheck iron and CBC.  Hyperlipidemia Check lipid panel.    Orders Placed This Encounter  Procedures  . Flu Vaccine QUAD 36+ mos IM  . Comp Met (CMET)  . Lipid panel  . HgB A1c  . Iron, TIBC and Ferritin Panel  . CBC    No orders of the defined types were placed in this encounter.    [name], MD [place]

## 2019-02-22 NOTE — Assessment & Plan Note (Signed)
Check lab work.  Continue current regimen. 

## 2019-02-22 NOTE — Assessment & Plan Note (Signed)
Recheck iron and CBC.

## 2019-02-22 NOTE — Assessment & Plan Note (Signed)
Above goal though she does not check at home.  She will buy a cuff and start checking and let us know what her blood pressures running over the next 1 to 2 weeks.  If above goal we will add additional medication.  She will continue her current regimen at this time.

## 2019-02-22 NOTE — Assessment & Plan Note (Signed)
Check lipid panel  

## 2019-02-22 NOTE — Patient Instructions (Signed)
Nice to see you. Please start checking your blood pressure daily at home.  [employer] is a good blood pressure cuff manufacturer.  You should get one that goes around her upper arm.  Please contact us in 2 weeks with blood pressure readings. Please start exercising 2 to 3 days a week.  Please try to cut down on the sodas. We will get lab work and contact you with the results.

## 2019-02-23 LAB — COMPREHENSIVE METABOLIC PANEL
AG Ratio: 2 (calc) (ref 1.0–2.5)
ALT: 22 U/L (ref 6–29)
AST: 17 U/L (ref 10–35)
Albumin: 4.9 g/dL (ref 3.6–5.1)
Alkaline phosphatase (APISO): 69 U/L (ref 31–125)
BUN: 10 mg/dL (ref 7–25)
CO2: 26 mmol/L (ref 20–32)
Calcium: 10 mg/dL (ref 8.6–10.2)
Chloride: 103 mmol/L (ref 98–110)
Creat: 0.8 mg/dL (ref 0.50–1.10)
Globulin: 2.5 g/dL (calc) (ref 1.9–3.7)
Glucose, Bld: 157 mg/dL — ABNORMAL HIGH (ref 65–99)
Potassium: 4.1 mmol/L (ref 3.5–5.3)
Sodium: 137 mmol/L (ref 135–146)
Total Bilirubin: 0.7 mg/dL (ref 0.2–1.2)
Total Protein: 7.4 g/dL (ref 6.1–8.1)

## 2019-02-23 LAB — IRON,TIBC AND FERRITIN PANEL
%SAT: 18 % (calc) (ref 16–45)
Ferritin: 13 ng/mL — ABNORMAL LOW (ref 16–232)
Iron: 70 ug/dL (ref 40–190)
TIBC: 391 mcg/dL (calc) (ref 250–450)

## 2019-02-23 LAB — LIPID PANEL
Cholesterol: 193 mg/dL (ref <200)
HDL: 41 mg/dL — ABNORMAL LOW (ref >=50)
LDL Cholesterol (Calc): 111 mg/dL (calc) — ABNORMAL HIGH
Non-HDL Cholesterol (Calc): 152 mg/dL (calc) — ABNORMAL HIGH (ref <130)
Total CHOL/HDL Ratio: 4.7 (calc) (ref <5.0)
Triglycerides: 284 mg/dL — ABNORMAL HIGH (ref <150)

## 2019-02-23 LAB — HEMOGLOBIN A1C
Hgb A1c MFr Bld: 6.6 % of total Hgb — ABNORMAL HIGH (ref <5.7)
Mean Plasma Glucose: 143 (calc)
eAG (mmol/L): 7.9 (calc)

## 2019-02-23 LAB — CBC
HCT: 45.5 % — ABNORMAL HIGH (ref 35.0–45.0)
Hemoglobin: 15.3 g/dL (ref 11.7–15.5)
MCH: 29.5 pg (ref 27.0–33.0)
MCHC: 33.6 g/dL (ref 32.0–36.0)
MCV: 87.8 fL (ref 80.0–100.0)
MPV: 12 fL (ref 7.5–12.5)
Platelets: 275 10*3/uL (ref 140–400)
RBC: 5.18 10*6/uL — ABNORMAL HIGH (ref 3.80–5.10)
RDW: 12.4 % (ref 11.0–15.0)
WBC: 7.5 10*3/uL (ref 3.8–10.8)

## 2019-02-28 ENCOUNTER — Other Ambulatory Visit: Payer: Self-pay | Admitting: Family Medicine

## 2019-03-05 ENCOUNTER — Other Ambulatory Visit: Payer: Self-pay | Admitting: Family Medicine

## 2019-03-05 DIAGNOSIS — E611 Iron deficiency: Secondary | ICD-10-CM

## 2019-03-11 ENCOUNTER — Encounter: Payer: Self-pay | Admitting: Family Medicine

## 2019-03-11 DIAGNOSIS — E611 Iron deficiency: Secondary | ICD-10-CM

## 2019-03-12 ENCOUNTER — Encounter: Payer: Self-pay | Admitting: Gastroenterology

## 2019-03-13 ENCOUNTER — Other Ambulatory Visit: Payer: Self-pay | Admitting: Family Medicine

## 2019-03-27 ENCOUNTER — Ambulatory Visit (INDEPENDENT_AMBULATORY_CARE_PROVIDER_SITE_OTHER): Payer: BLUE CROSS/BLUE SHIELD | Admitting: Family Medicine

## 2019-03-27 ENCOUNTER — Other Ambulatory Visit: Payer: Self-pay

## 2019-03-27 ENCOUNTER — Encounter: Payer: Self-pay | Admitting: Family Medicine

## 2019-03-27 VITALS — Ht 65.0 in | Wt 180.0 lb

## 2019-03-27 DIAGNOSIS — Z20822 Contact with and (suspected) exposure to covid-19: Secondary | ICD-10-CM | POA: Insufficient documentation

## 2019-03-27 DIAGNOSIS — Z20828 Contact with and (suspected) exposure to other viral communicable diseases: Secondary | ICD-10-CM | POA: Diagnosis not present

## 2019-03-27 NOTE — Assessment & Plan Note (Signed)
Symptoms are concerning for COVID-19.  Discussed strict quarantine precautions at home.  Advised that her roommate quarantine as well.  She noted no respiratory complaints from her roommate and I advised that her roommate could be tested 5 to 7 days after the onset of the patient's symptoms.  Advised that if the roommate develops symptoms they should contact us sooner.  Her roommate sees Dr. Derrel Nip.  I did advise that we could provide a work note for the roommate if needed given that she sees a physician in this office.  Discussed continued use of albuterol for the patient.  Discussed reasons to seek medical attention in the emergency room.  I advised that her parents quarantine as well and if they developed any symptoms they should be tested sooner than 5 to 7 days after onset of her symptoms.  We will provide a work note for the patient.  My chart monitoring ordered.  Patient will go for testing today at [facility].  Discussed typically results have been returning in 1 to 2 days though could take up to a week.  She will remain quarantined until we release her.  Advised of return to work guidelines and that they would be sent to her through my chart.

## 2019-03-27 NOTE — Progress Notes (Signed)
Virtual Visit via video Note  This visit type was conducted due to national recommendations for restrictions regarding the COVID-19 pandemic (e.g. social distancing).  This format is felt to be most appropriate for this patient at this time.  All issues noted in this document were discussed and addressed.  No physical exam was performed (except for noted visual exam findings with Video Visits).   I connected with Eben Burow today at 10:00 AM EST by a video enabled telemedicine application or telephone and verified that I am speaking with the correct person using two identifiers. Location patient: home Location provider: work  Persons participating in the virtual visit: patient, provider, Buyer, retail (roommate)  I discussed the limitations, risks, security and privacy concerns of performing an evaluation and management service by telephone and the availability of in person appointments. I also discussed with the patient that there may be a patient responsible charge related to this service. The patient expressed understanding and agreed to proceed.  Reason for visit: same day  HPI: Respiratory illness: Patient reports symptoms started on Sunday with mild upset stomach.  She developed sore throat and achiness on Monday.  She had some nasal congestion at that time.  Monday night she felt as though she was having trouble breathing which responded well to her albuterol inhaler.  No fevers.  Temperature was 98.2 F earlier today.  She had a dry cough mostly when she lays down.  She has a burning sensation in her chest when she breathes with some airway tightness though no chest pain.  She has no known COVID-19 exposure.  She [place]   ROS: See pertinent positives and negatives per HPI.  Past Medical History:  Diagnosis Date  . Allergic rhinitis   . Chickenpox   . Chronic cholecystitis without calculus s/p lap  cholecystectomy 02/04/2016 02/04/2016  . Diabetes ([place])    currently no meds- trying to control with diet  . Fatty liver   . Gastric polyps   . GERD (gastroesophageal reflux disease)   . Guaiac positive stools 08/03/2015  . Hyperlipemia   . Norovirus 07/2015  . Parathyroid abnormality ([place])   . Raynaud disease   . Tubular adenoma of colon   . UTI (lower urinary tract infection)     Past Surgical History:  Procedure Laterality Date  . BREAST CYST EXCISION    . LAPAROSCOPIC CHOLECYSTECTOMY SINGLE SITE WITH INTRAOPERATIVE CHOLANGIOGRAM N/A 02/04/2016   Procedure: LAPAROSCOPIC CHOLECYSTECTOMY SINGLE SITE WITH INTRAOPERATIVE CHOLANGIOGRAM AND STUDY OF BILE DUCTS;  Surgeon: Michael Boston, MD;  Location: [facility];  Service: General;  Laterality: N/A;  . PARATHYROIDECTOMY  2009  . TUBAL LIGATION  2008    Family History  Problem Relation Age of Onset  . Stroke Father   . Heart disease Maternal Grandfather   . Hypertension Paternal Aunt   . Diabetes Paternal Aunt   . Cancer Cousin        Peritoneal carcinoma  . Prostate cancer Other        great uncle  . Heart disease Paternal Grandmother   . Diverticulosis Mother   . GER disease Maternal Aunt   . Colon cancer Neg Hx     SOCIAL HX: Non-smoker.   Current Outpatient Medications:  .  amLODipine (NORVASC) 10 MG tablet, TAKE 1 TABLET BY MOUTH ONCE A DAY, Disp: 90 tablet, Rfl: 3 .  benzonatate (TESSALON) 100 MG capsule, Take 1 capsule (100 mg  total) by mouth 2 (two) times daily as needed for cough., Disp: 20 capsule, Rfl: 0 .  Blood Glucose Monitoring Suppl (RELION CONFIRM GLUCOSE MONITOR) w/Device KIT, Check your blood glucose once daily before breakfast, Disp: 1 kit, Rfl: 0 .  cetirizine (ZYRTEC ALLERGY) 10 MG tablet, Take 10 mg by mouth daily., Disp: , Rfl:  .  cholecalciferol (VITAMIN D) 1000 units tablet, Take 5,000 Units by mouth daily., Disp: , Rfl:  .  CONTOUR NEXT TEST test strip, USE TO CHECK BLOOD SUGAR ONCE DAILY BEFORE  BREAKFAST, Disp: 100 each, Rfl: 12 .  FEROSUL 325 (65 Fe) MG tablet, TAKE 1 TABLET BY MOUTH TWICE A DAY WITH A MEAL. TAKE WITH VITAMIN CAPSULE, Disp: 30 tablet, Rfl: 1 .  fluticasone (FLONASE) 50 MCG/ACT nasal spray, USE 2 SPRAYS IN EACH NOSTRIL ONCE DAILY, Disp: 16 g, Rfl: 2 .  JARDIANCE 25 MG TABS tablet, TAKE 1 TABLET BY MOUTH ONCE A DAY, Disp: 90 tablet, Rfl: 0 .  Lancet Devices (ONE TOUCH DELICA LANCING DEV) MISC, , Disp: , Rfl: 0 .  lisinopril (ZESTRIL) 5 MG tablet, TAKE 1 TABLET BY MOUTH ONCE DAILY, Disp: 30 tablet, Rfl: 1 .  metFORMIN (GLUCOPHAGE) 500 MG tablet, TAKE 2 TABLETS BY MOUTH TWICE A DAY WITHA MEAL, Disp: 360 tablet, Rfl: 3 .  metFORMIN (GLUCOPHAGE) 500 MG tablet, TAKE 2 TABLETS BY MOUTH TWICE A DAY WITHA MEAL, Disp: 120 tablet, Rfl: 0 .  MICROLET LANCETS MISC, CHECK YOUR BLOOD SUGAR ONCE DAILY BEFOREBREAKFAST, Disp: 100 each, Rfl: 1 .  omeprazole (PRILOSEC) 40 MG capsule, TAKE 1 CAPSULE BY MOUTH TWICE DAILY BEFORE A MEAL, Disp: 60 capsule, Rfl: 3 .  PROAIR HFA 108 (90 Base) MCG/ACT inhaler, INHALE 2 PUFFS INTO THE LUNGS EVERY 6 HOURS AS NEEDED FOR WHEEZING ORSHORTNESS OF BREATH, Disp: 8.5 g, Rfl: 0 .  Pyridoxine HCl (VITAMIN B6) 200 MG TABS, Take 1 tablet by mouth daily. , Disp: , Rfl:  .  triamcinolone cream (KENALOG) 0.1 %, Apply 1 application topically 2 (two) times daily., Disp: 30 g, Rfl: 0  EXAM:  VITALS per patient if applicable: Temperature 98.2 F.  GENERAL: alert, oriented, appears well and in no acute distress  HEENT: atraumatic, conjunttiva clear, no obvious abnormalities on inspection of external nose and ears  NECK: normal movements of the head and neck  LUNGS: on inspection no signs of respiratory distress, breathing rate appears normal, no obvious gross SOB, gasping or wheezing  CV: no obvious cyanosis  MS: moves all visible extremities without noticeable abnormality  PSYCH/NEURO: pleasant and cooperative, no obvious depression or anxiety, speech and  thought processing grossly intact  ASSESSMENT AND PLAN:  Discussed the following assessment and plan:  Suspected COVID-19 virus infection Symptoms are concerning for COVID-19.  Discussed strict quarantine precautions at home.  Advised that her roommate quarantine as well.  She noted no respiratory complaints from her roommate and I advised that her roommate could be tested 5 to 7 days after the onset of the patient's symptoms.  Advised that if the roommate develops symptoms they should contact us sooner.  Her roommate sees Dr. Derrel Nip.  I did advise that we could provide a work note for the roommate if needed given that she sees a physician in this office.  Discussed continued use of albuterol for the patient.  Discussed reasons to seek medical attention in the emergency room.  I advised that her parents quarantine as well and if they developed any symptoms they should be tested  sooner than 5 to 7 days after onset of her symptoms.  We will provide a work note for the patient.  My chart monitoring ordered.  Patient will go for testing today at [facility].  Discussed typically results have been returning in 1 to 2 days though could take up to a week.  She will remain quarantined until we release her.  Advised of return to work guidelines and that they would be sent to her through my chart.    I discussed the assessment and treatment plan with the patient. The patient was provided an opportunity to ask questions and all were answered. The patient agreed with the plan and demonstrated an understanding of the instructions.   The patient was advised to call back or seek an in-person evaluation if the symptoms worsen or if the condition fails to improve as anticipated.   [name], MD

## 2019-03-28 ENCOUNTER — Other Ambulatory Visit: Payer: Self-pay | Admitting: Family Medicine

## 2019-03-29 ENCOUNTER — Telehealth: Payer: Self-pay | Admitting: Family Medicine

## 2019-03-29 ENCOUNTER — Telehealth: Payer: Self-pay

## 2019-03-29 ENCOUNTER — Encounter: Payer: Self-pay | Admitting: Family Medicine

## 2019-03-29 DIAGNOSIS — J111 Influenza due to unidentified influenza virus with other respiratory manifestations: Secondary | ICD-10-CM

## 2019-03-29 DIAGNOSIS — R509 Fever, unspecified: Secondary | ICD-10-CM

## 2019-03-29 LAB — NOVEL CORONAVIRUS, NAA: SARS-CoV-2, NAA: NOT DETECTED

## 2019-03-29 MED ORDER — BENZONATATE 100 MG PO CAPS: 100.0000 mg | ORAL_CAPSULE | Freq: Two times a day (BID) | ORAL | 0 refills | Status: DC | PRN

## 2019-03-29 MED ORDER — DOXYCYCLINE HYCLATE 100 MG PO TABS: 100.0000 mg | ORAL_TABLET | Freq: Two times a day (BID) | ORAL | 0 refills | Status: DC

## 2019-03-29 NOTE — Telephone Encounter (Signed)
Tessalon sent to pharmacy. Doxycycline sent to pharmacy to treat for possible bronchitis. Her COVID19 test was negative, though given her continued symptoms she should remain quarantined until it has been 10 days from the onset of her symptoms and she has had at least 3 days of improved respiratory symptoms and at least 3 days without a fever without use of antipyretics such as Tylenol or ibuprofen.

## 2019-03-29 NOTE — Telephone Encounter (Signed)
Pt called in to update PCP, she received her covid results back, they are negative. Pt says that she still isn't feeling her best. Pt says that PCP discussed sending in a antibiotic if pt wasn't feeling better, please assist pt directly.    Pharmacy:  [place], [place] [phone] (Phone) [phone] (Fax)

## 2019-03-29 NOTE — Telephone Encounter (Signed)
Copied from [place] [phone]. Topic: General - Inquiry >> Mar 29, 2019  1:26 PM Alease Frame wrote: Reason for CRM: Patient is wanting to be prescribed something for her cough . Please advise

## 2019-03-29 NOTE — Telephone Encounter (Signed)
  Pt called in to update PCP, she received her covid results back, they are negative. Pt says that she still isn't feeling her best. Pt says that PCP discussed sending in a antibiotic if pt wasn't feeling better, please assist pt directly.    Pharmacy:  [place], [place] [phone] (Phone) [phone] (Fax)       Gae Bon, [place]

## 2019-03-29 NOTE — Addendum Note (Signed)
Addended by: Caryl Bis, Cody Oliger G on: 03/29/2019 02:17 PM   Modules accepted: Orders

## 2019-03-29 NOTE — Telephone Encounter (Signed)
We faxed a letter outlining the requirements to come off quarantine. I have included them below. If she needs an additional letter we can create another one with the requirements as outlined below. I can not give a specific date for return to work until she has met these criteria.   They should continue to self-isolate at home and self-monitor until they have met the CDC "Non-Test Criteria for Ending Self-Isolation" which includes all of the following: i. at least 10 days since symptoms onset  ii. AND 3 days fever free without antipyretics (Tylenol or Ibuprofen) iii. AND improvement in respiratory symptoms.

## 2019-03-29 NOTE — Telephone Encounter (Signed)
Patient called & notified of below.  

## 2019-03-29 NOTE — Telephone Encounter (Signed)
Please see the other phone note from today on this patient. Thanks.

## 2019-04-03 ENCOUNTER — Encounter: Payer: Self-pay | Admitting: Family Medicine

## 2019-04-03 ENCOUNTER — Telehealth: Payer: Self-pay | Admitting: *Deleted

## 2019-04-03 NOTE — Telephone Encounter (Signed)
I called the patient and she stated that she has one more day of antibiotics left she just went back to work, she stated she ate her lunch and vomited and she has some diarrhea. I informed the patient to go to urgent care because we don't have any openings, because it could be the antibiotics but I'm not sure.  Arth Nicastro,cma

## 2019-04-03 NOTE — Telephone Encounter (Signed)
Copied from [place] [id number]. Topic: General - Other >> Apr 03, 2019 11:18 AM Leward Quan A wrote: Reason for CRM: Patient called to say that she was out of work sick for about 10 days and just went back to work on 04/02/2019 states that after eating her lunch she got sick on her stomach to where she vomited. She is not sure what it is but that she is still sick today and would like a call back to discuss what she can possibly do. Per patient she have been on her antibiotics for 5 days but not sure if that is what caused the sudden illness. Please advise Ph#  [phone]

## 2019-04-03 NOTE — Telephone Encounter (Signed)
I called and spoke to the patient and informed her that she should not be taking the doxycycline antibiotic with her Iron pill and she stated that she had been taking them together. She only has one day left and I informed her to hold the iron pill until she gets through the one more day and I also informed her to watch her stools she stated they are watery not foul smelling so I informed herto watch for this for C-Diff if she does see mucous or jelly like stools that are foul smelling to go to urgent care immediatly to rule out C-Diff and she understood.  I also informed her if she experiencing any other symptoms like H/A or her cough she needs to be retested and she understood.  Olanrewaju Osborn,cma

## 2019-04-03 NOTE — Telephone Encounter (Signed)
Agree with urgent care  [place]

## 2019-04-04 ENCOUNTER — Encounter: Payer: Self-pay | Admitting: Family Medicine

## 2019-04-04 ENCOUNTER — Ambulatory Visit (INDEPENDENT_AMBULATORY_CARE_PROVIDER_SITE_OTHER): Payer: BLUE CROSS/BLUE SHIELD | Admitting: Family Medicine

## 2019-04-04 VITALS — BP 131/85 | HR 86 | Temp 98.4°F | Ht 64.0 in | Wt 189.3 lb

## 2019-04-04 DIAGNOSIS — R059 Cough, unspecified: Secondary | ICD-10-CM

## 2019-04-04 DIAGNOSIS — J208 Acute bronchitis due to other specified organisms: Secondary | ICD-10-CM | POA: Diagnosis not present

## 2019-04-04 DIAGNOSIS — R05 Cough: Secondary | ICD-10-CM | POA: Diagnosis not present

## 2019-04-04 MED ORDER — ONDANSETRON 8 MG PO TBDP: 8.0000 mg | ORAL_TABLET | Freq: Three times a day (TID) | ORAL | 0 refills | Status: AC | PRN

## 2019-04-04 MED ORDER — AZITHROMYCIN 250 MG PO TABS: ORAL_TABLET | ORAL | 0 refills | Status: AC

## 2019-04-04 NOTE — Progress Notes (Signed)
Jacquline Terrill T. Shonte Beutler, MD Primary Care and [place] at [facility] [place], [address] Phone: [phone]  FAX: [phone]  Nicole Bryant - 49 y.o. female  MRN [id number]  Date of Birth: March 06, 1971  Visit Date: 04/04/2019  PCP: Leone Haven, MD  Referred by: Leone Haven, MD Chief Complaint  Patient presents with  . Emesis    Dx with Bronchitis last Wednesday. Started on Doxycycline on Friday.  Negative Covid Test  . Diarrhea   Virtual Visit via Video Note:  I connected with  Nonah Mattes on 04/04/2019 10:20 AM EST by a video enabled telemedicine application and verified that I am speaking with the correct person using two identifiers.   Location patient: home computer, tablet, or smartphone Location provider: work or home office Consent: Verbal consent directly obtained from Nonah Mattes. Persons participating in the virtual visit: patient, provider  I discussed the limitations of evaluation and management by telemedicine and the availability of in person appointments. The patient expressed understanding and agreed to proceed.  History of Present Illness:  Lab Results  Component Value Date   HGBA1C 6.6 (H) 02/22/2019    She is a pleasant lady, and she saw her primary care doctor on March 27, 2019.  At that point there was concern for potential covid 19.  Her test came back 03/29/2019, and she was placed on Doxy.  Since then she has had a lot of nausea, and occasionally she has been throwing up.  She has had some mild abdominal pain, today she is in the bed.  Review of Systems as above: See pertinent positives and pertinent negatives per HPI No acute distress verbally  Past Medical History, Surgical History, Social History, Family History, Problem List, Medications, and Allergies have been reviewed and updated if relevant.   Observations/Objective/Exam:  An attempt  was made to discern vital signs over the phone and per patient if applicable and possible.   General:    Alert, Oriented, appears well and in no acute distress HEENT:     Atraumatic, conjunctiva clear, no obvious abnormalities on inspection of external nose and ears.  Neck:    Normal movements of the head and neck Pulmonary:     On inspection no signs of respiratory distress, breathing rate appears normal, no obvious gross SOB, gasping or wheezing Cardiovascular:    No obvious cyanosis Musculoskeletal:    Moves all visible extremities without noticeable abnormality Psych / Neurological:     Pleasant and cooperative, no obvious depression or anxiety, speech and thought processing grossly intact  Assessment and Plan:    ICD-10-CM   1. Acute bronchitis due to other specified organisms  J20.8   2. Cough  R05    I am suspicious that this is side effect from doxycycline.  I am going to stop this and give her some Zithromax.  For nausea, Zofran 8.  I discussed the assessment and treatment plan with the patient. The patient was provided an opportunity to ask questions and all were answered. The patient agreed with the plan and demonstrated an understanding of the instructions.   The patient was advised to call back or seek an in-person evaluation if the symptoms worsen or if the condition fails to improve as anticipated.  Follow-up: prn unless noted otherwise below No follow-ups on file.  Meds ordered this encounter  Medications  . azithromycin (ZITHROMAX) 250 MG tablet    Sig: Take  2 tablets (500 mg total) by mouth daily for 1 day, THEN 1 tablet (250 mg total) daily for 4 days.    Dispense:  6 tablet    Refill:  0  . ondansetron (ZOFRAN ODT) 8 MG disintegrating tablet    Sig: Take 1 tablet (8 mg total) by mouth every 8 (eight) hours as needed for nausea or vomiting.    Dispense:  20 tablet    Refill:  0   No orders of the defined types were placed in this encounter.   Signed,   Maud Deed. Falcon Mccaskey, MD

## 2019-04-19 ENCOUNTER — Other Ambulatory Visit: Payer: Self-pay | Admitting: Family Medicine

## 2019-04-26 ENCOUNTER — Other Ambulatory Visit: Payer: Self-pay | Admitting: Family Medicine

## 2019-05-21 ENCOUNTER — Other Ambulatory Visit: Payer: Self-pay | Admitting: Family Medicine

## 2019-05-27 ENCOUNTER — Ambulatory Visit: Payer: BLUE CROSS/BLUE SHIELD | Admitting: Family Medicine

## 2019-05-29 ENCOUNTER — Ambulatory Visit: Payer: BLUE CROSS/BLUE SHIELD | Admitting: Family Medicine

## 2019-06-14 ENCOUNTER — Telehealth: Payer: Self-pay | Admitting: Family Medicine

## 2019-06-14 ENCOUNTER — Encounter: Payer: Self-pay | Admitting: Family Medicine

## 2019-06-14 NOTE — Telephone Encounter (Signed)
Patient sent a message stating she had a reaction to doxycycline, cramping and vomiting and nausea.   Ezekiel Menzer,cma

## 2019-06-14 NOTE — Telephone Encounter (Signed)
Patient called and stated she had nausea and vomiting and stomach cramping while taking the doxycycline , Noted in chart.  Schneider Warchol,cma

## 2019-06-14 NOTE — Telephone Encounter (Signed)
Pt had allergic reaction to doxycycline-caused vomiting/cramping even after meal. Please note in chart.

## 2019-06-17 ENCOUNTER — Other Ambulatory Visit: Payer: Self-pay | Admitting: Family Medicine

## 2019-06-18 ENCOUNTER — Ambulatory Visit: Payer: BLUE CROSS/BLUE SHIELD | Admitting: Family Medicine

## 2019-06-18 ENCOUNTER — Other Ambulatory Visit: Payer: Self-pay | Admitting: Family Medicine

## 2019-06-20 ENCOUNTER — Encounter: Payer: Self-pay | Admitting: Family Medicine

## 2019-06-26 NOTE — Telephone Encounter (Signed)
LVMTCB to set up follow up diabetes f/u in the next several weeks

## 2019-07-16 ENCOUNTER — Other Ambulatory Visit: Payer: Self-pay | Admitting: Family Medicine

## 2019-07-22 ENCOUNTER — Encounter: Payer: Self-pay | Admitting: *Deleted

## 2019-07-25 ENCOUNTER — Other Ambulatory Visit (INDEPENDENT_AMBULATORY_CARE_PROVIDER_SITE_OTHER): Payer: 59

## 2019-07-25 ENCOUNTER — Other Ambulatory Visit: Payer: Self-pay

## 2019-07-25 ENCOUNTER — Ambulatory Visit (INDEPENDENT_AMBULATORY_CARE_PROVIDER_SITE_OTHER): Payer: 59 | Admitting: Internal Medicine

## 2019-07-25 ENCOUNTER — Encounter: Payer: Self-pay | Admitting: Internal Medicine

## 2019-07-25 VITALS — BP 138/88 | HR 92 | Temp 97.0°F | Ht 64.0 in | Wt 201.1 lb

## 2019-07-25 DIAGNOSIS — D509 Iron deficiency anemia, unspecified: Secondary | ICD-10-CM

## 2019-07-25 DIAGNOSIS — R195 Other fecal abnormalities: Secondary | ICD-10-CM

## 2019-07-25 LAB — CBC WITH DIFFERENTIAL/PLATELET
Basophils Absolute: 0.1 10*3/uL (ref 0.0–0.1)
Basophils Relative: 1.1 % (ref 0.0–3.0)
Eosinophils Absolute: 0.1 10*3/uL (ref 0.0–0.7)
Eosinophils Relative: 1.7 % (ref 0.0–5.0)
HCT: 45.4 % (ref 36.0–46.0)
Hemoglobin: 15 g/dL (ref 12.0–15.0)
Lymphocytes Relative: 22.6 % (ref 12.0–46.0)
Lymphs Abs: 1.9 10*3/uL (ref 0.7–4.0)
MCHC: 33.1 g/dL (ref 30.0–36.0)
MCV: 92.4 fl (ref 78.0–100.0)
Monocytes Absolute: 0.6 10*3/uL (ref 0.1–1.0)
Monocytes Relative: 6.5 % (ref 3.0–12.0)
Neutro Abs: 5.8 10*3/uL (ref 1.4–7.7)
Neutrophils Relative %: 68.1 % (ref 43.0–77.0)
Platelets: 244 10*3/uL (ref 150.0–400.0)
RBC: 4.91 Mil/uL (ref 3.87–5.11)
RDW: 13.5 % (ref 11.5–15.5)
WBC: 8.5 10*3/uL (ref 4.0–10.5)

## 2019-07-25 LAB — IBC PANEL
Iron: 81 ug/dL (ref 42–145)
Saturation Ratios: 22.5 % (ref 20.0–50.0)
Transferrin: 257 mg/dL (ref 212.0–360.0)

## 2019-07-25 LAB — FERRITIN: Ferritin: 30.8 ng/mL (ref 10.0–291.0)

## 2019-07-25 MED ORDER — DIPHENOXYLATE-ATROPINE 2.5-0.025 MG PO TABS: 1.0000 | ORAL_TABLET | Freq: Three times a day (TID) | ORAL | 6 refills | Status: DC | PRN

## 2019-07-25 NOTE — Progress Notes (Signed)
HPI: Nicole Bryant is a 49 year old female with a past medical history of GERD, adenomatous colon polyps, diabetes, hyperlipidemia who last saw in summer 2017 for abdominal pain and heme positive stool.  Subsequently she had her gallbladder removed in September 2017 and it was found she had chronic cholecystitis.  Much of her GI symptoms improved at that time.  She presents today for evaluation of iron deficiency anemia at the request of Dr. Caryl Bis.  She is here alone today.  Upper endoscopy and colonoscopy on 09/16/2015 EGD multiple 3 to 5 mm sessile polyps in the proximal stomach biopsied is fundic gland polyps.  Otherwise normal stomach.  No H. pylori.  Normal examined duodenum.  Colonoscopy 2 4 to 5 mm polyps in the sigmoid and descending colon found to be adenomatous.  She reports she did very well after her gallbladder removal.  Though she occasionally can have loose stools while other days her stools are normal.  She has no blood in stool or melena.  She has noticed iron deficiency over the last several years intermittently and she has intermittently taken oral iron.  Her menstrual periods are sporadic but not overly heavy bleeding.  In January she reports she had a period every other week for several weeks which she attributed to may be early menopause.  Past Medical History:  Diagnosis Date  . Allergic rhinitis   . Chickenpox   . Chronic cholecystitis without calculus s/p lap cholecystectomy 02/04/2016 02/04/2016  . Diabetes ([place])    currently no meds- trying to control with diet  . Fatty liver   . Gastric polyps   . GERD (gastroesophageal reflux disease)   . Guaiac positive stools 08/03/2015  . Hyperlipemia   . Low iron   . Norovirus 07/2015  . Parathyroid abnormality ([place])   . Raynaud disease   . Tubular adenoma of colon   . UTI (lower urinary tract infection)     Past Surgical History:  Procedure Laterality Date  . BREAST CYST EXCISION    . LAPAROSCOPIC  CHOLECYSTECTOMY SINGLE SITE WITH INTRAOPERATIVE CHOLANGIOGRAM N/A 02/04/2016   Procedure: LAPAROSCOPIC CHOLECYSTECTOMY SINGLE SITE WITH INTRAOPERATIVE CHOLANGIOGRAM AND STUDY OF BILE DUCTS;  Surgeon: Michael Boston, MD;  Location: [facility];  Service: General;  Laterality: N/A;  . PARATHYROIDECTOMY  2009  . TUBAL LIGATION  2008    Outpatient Medications Prior to Visit  Medication Sig Dispense Refill  . amLODipine (NORVASC) 10 MG tablet TAKE 1 TABLET BY MOUTH ONCE DAILY 90 tablet 3  . benzonatate (TESSALON) 100 MG capsule Take 1 capsule (100 mg total) by mouth 2 (two) times daily as needed for cough. 20 capsule 0  . Blood Glucose Monitoring Suppl (RELION CONFIRM GLUCOSE MONITOR) w/Device KIT Check your blood glucose once daily before breakfast 1 kit 0  . cholecalciferol (VITAMIN D) 1000 units tablet Take 5,000 Units by mouth daily.    . CONTOUR NEXT TEST test strip USE TO CHECK BLOOD SUGAR ONCE DAILY BEFORE BREAKFAST 100 each 12  . ferrous sulfate (FEROSUL) 325 (65 FE) MG tablet Take 325 mg by mouth daily with breakfast.    . fluticasone (FLONASE) 50 MCG/ACT nasal spray USE 2 SPRAYS IN EACH NOSTRIL ONCE DAILY 16 g 2  . JARDIANCE 25 MG TABS tablet TAKE 1 TABLET BY MOUTH ONCE A DAY 90 tablet 1  . Lancet Devices (ONE TOUCH DELICA LANCING DEV) MISC   0  . lisinopril (ZESTRIL) 5 MG tablet TAKE 1 TABLET BY MOUTH ONCE DAILY 30 tablet 1  .  loratadine (CLARITIN) 10 MG tablet Take 10 mg by mouth daily.    . metFORMIN (GLUCOPHAGE) 500 MG tablet TAKE 2 TABLETS BY MOUTH TWICE DAILY WITHA MEAL 360 tablet 3  . MICROLET LANCETS MISC CHECK YOUR BLOOD SUGAR ONCE DAILY BEFOREBREAKFAST 100 each 1  . omeprazole (PRILOSEC) 40 MG capsule Take 40 mg by mouth daily.    . ondansetron (ZOFRAN ODT) 8 MG disintegrating tablet Take 1 tablet (8 mg total) by mouth every 8 (eight) hours as needed for nausea or vomiting. 20 tablet 0  . PROAIR HFA 108 (90 Base) MCG/ACT inhaler INHALE 2 PUFFS INTO THE LUNGS EVERY 6 HOURS AS NEEDED FOR  WHEEZING OR SHORTNESS OF BREATH 8.5 g 0  . triamcinolone cream (KENALOG) 0.1 % APPLY TOPICALLY 2 TIMES DAILY 30 g 0  . cetirizine (ZYRTEC ALLERGY) 10 MG tablet Take 10 mg by mouth daily.    . FEROSUL 325 (65 Fe) MG tablet TAKE 1 TABLET BY MOUTH TWICE A DAY WITH A MEAL. TAKE WITH VITAMIN CAPSULE (Patient not taking: Reported on 07/25/2019) 30 tablet 1  . omeprazole (PRILOSEC) 40 MG capsule TAKE 1 CAPSULE BY MOUTH TWICE DAILY BEFORE A MEAL (Patient not taking: Reported on 07/25/2019) 60 capsule 3  . Pyridoxine HCl (VITAMIN B6) 200 MG TABS Take 1 tablet by mouth daily.      No facility-administered medications prior to visit.    Allergies  Allergen Reactions  . Doxycycline Nausea And Vomiting and Other (See Comments)  . Codeine Itching  . Sulfa Antibiotics Nausea And Vomiting    Family History  Problem Relation Age of Onset  . Stroke Father   . Heart disease Maternal Grandfather   . Hypertension Paternal Aunt   . Diabetes Paternal Aunt   . Cancer Cousin        Peritoneal carcinoma  . Prostate cancer Other        great uncle  . Heart disease Paternal Grandmother   . Diverticulosis Mother   . GER disease Maternal Aunt   . Colon cancer Neg Hx     Social History   Tobacco Use  . Smoking status: Never Smoker  . Smokeless tobacco: Never Used  Substance Use Topics  . Alcohol use: No    Alcohol/week: 0.0 standard drinks  . Drug use: No    ROS: As per history of present illness, otherwise negative  BP 138/88 (BP Location: Left Arm, Patient Position: Sitting, Cuff Size: Normal)   Pulse 92   Temp (!) 97 F (36.1 C)   Ht 5' 4"  (1.626 m)   Wt 201 lb 2 oz (91.2 kg)   SpO2 100%   BMI 34.52 kg/m  Gen: awake, alert, NAD HEENT: anicteric, op clear CV: RRR, no mrg Pulm: CTA b/l Abd: soft, NT/ND, +BS throughout Ext: no c/c/e Neuro: nonfocal   RELEVANT LABS AND IMAGING: CBC    Component Value Date/Time   WBC 8.5 07/25/2019 1604   RBC 4.91 07/25/2019 1604   HGB 15.0  07/25/2019 1604   HCT 45.4 07/25/2019 1604   PLT 244.0 07/25/2019 1604   MCV 92.4 07/25/2019 1604   MCH 29.5 02/22/2019 1641   MCHC 33.1 07/25/2019 1604   RDW 13.5 07/25/2019 1604   LYMPHSABS 1.9 07/25/2019 1604   MONOABS 0.6 07/25/2019 1604   EOSABS 0.1 07/25/2019 1604   BASOSABS 0.1 07/25/2019 1604    CMP     Component Value Date/Time   NA 137 02/22/2019 1641   K 4.1 02/22/2019 1641  CL 103 02/22/2019 1641   CO2 26 02/22/2019 1641   GLUCOSE 157 (H) 02/22/2019 1641   BUN 10 02/22/2019 1641   CREATININE 0.80 02/22/2019 1641   CALCIUM 10.0 02/22/2019 1641   PROT 7.4 02/22/2019 1641   ALBUMIN 4.8 05/02/2018 0915   AST 17 02/22/2019 1641   ALT 22 02/22/2019 1641   ALKPHOS 58 05/02/2018 0915   BILITOT 0.7 02/22/2019 1641   GFRNONAA >60 06/21/2017 2001   GFRAA >60 06/21/2017 2001   Iron/TIBC/Ferritin/ %Sat    Component Value Date/Time   IRON 81 07/25/2019 1604   TIBC 391 02/22/2019 1641   FERRITIN 30.8 07/25/2019 1604   IRONPCTSAT 22.5 07/25/2019 1604   IRONPCTSAT 18 02/22/2019 1641   ASSESSMENT/PLAN: 49 year old female with a past medical history of GERD, adenomatous colon polyps, diabetes, hyperlipidemia who last saw in summer 2017 for abdominal pain and heme positive stool who is here today for iron deficiency.  1.  Iron deficiency --she did have iron deficiency and has been on oral iron.  I checked CBC and iron studies today and they have improved on oral iron.  No visible blood in her stool or melena though she did have a heme positive stool in 2017 before her upper endoscopy and colonoscopy.  No source for iron deficiency on those tests though this was nearly 4 years ago.  We discussed this at length today and decided on the following --CBC, ferritin done today and improved on oral iron --Check a celiac panel --Check fecal occult blood tests if negative, repeat in April 2021; if positive pursue upper endoscopy colonoscopy and if needed video capsule endoscopy --If  stool heme-negative will defer colonoscopy until May 2022 which would be her 5-year surveillance exam and would like to repeat EGD at that time --CBC and iron studies should be followed on a routine basis either here or at primary care  2.  Loose stools --intermittent and likely the result of cholecystectomy.  Given the intermittent nature I will give her prescription for Lomotil to use 3 times daily as needed rather than colestipol or cholestyramine, the latter could be used if symptoms more frequent     Cc:Leone Haven, Md [address] [place],   [address]

## 2019-07-25 NOTE — Patient Instructions (Signed)
If you are age 49 or older, your body mass index should be between 23-30. Your Body mass index is 34.52 kg/m. If this is out of the aforementioned range listed, please consider follow up with your Primary Care Provider.  If you are age 5 or younger, your body mass index should be between 19-25. Your Body mass index is 34.52 kg/m. If this is out of the aformentioned range listed, please consider follow up with your Primary Care Provider.   Your provider has requested that you go to the basement level for lab work before leaving today. Press "B" on the elevator. The lab is located at the first door on the left as you exit the elevator.  Please complete hemoccult cards and return via USPS.  We have sent the following medications to your pharmacy for you to pick up at your convenience:  START: lomotil 1 tablet three times daily as needed.  Continue oral iron.  If you are age 29 or older, your body mass index should be between 23-30. Your Body mass index is 34.52 kg/m. If this is out of the aforementioned range listed, please consider follow up with your Primary Care Provider.  If you are age 76 or younger, your body mass index should be between 19-25. Your Body mass index is 34.52 kg/m. If this is out of the aformentioned range listed, please consider follow up with your Primary Care Provider.   Due to recent changes in healthcare laws, you may see the results of your imaging and laboratory studies on MyChart before your provider has had a chance to review them.  We understand that in some cases there may be results that are confusing or concerning to you. Not all laboratory results come back in the same time frame and the provider may be waiting for multiple results in order to interpret others.  Please give [place] 48 hours in order for your provider to thoroughly review all the results before contacting the office for clarification of your results.

## 2019-07-31 ENCOUNTER — Telehealth: Payer: Self-pay | Admitting: *Deleted

## 2019-07-31 NOTE — Telephone Encounter (Signed)
-----   Message from Jerene Bears, MD sent at 07/31/2019 11:34 AM EDT ----- Madaline Brilliant to ask PCP for celiac labs. Thanks JMP ----- Message ----- From: Larina Bras, CMA Sent: 07/26/2019   9:47 AM EDT To: Jerene Bears, MD  They are unable to add iga, ttg on from the labs that were ordered today. I can either have her come back for these labs if needed OR have her ask Dr Caryl Bis to draw these at her upcoming appointment with him on 08/06/19. Thoughts? ----- Message ----- From: Jerene Bears, MD Sent: 07/25/2019   5:48 PM EST To: Larina Bras, CMA  Please add a celiac panel from today's labs

## 2019-07-31 NOTE — Telephone Encounter (Signed)
Dr Caryl Bis- You are seeing this patient in follow up on 08/06/19. Would you mind drawing a tissue transglutaminase and IgA for [place]? We drew labs last week and wanted to add these on but there wasn't enough blood to add any labs on.   We would appreciate any help you can give!  Thank you so much!

## 2019-07-31 NOTE — Telephone Encounter (Signed)
I can certainly do that.   Gae Bon, can you add these labs to her appointment notes as a reminder to draw them?

## 2019-08-01 NOTE — Telephone Encounter (Signed)
I put the name of the labs in the appointment note.  Juluis Fitzsimmons,cma

## 2019-08-05 ENCOUNTER — Telehealth: Payer: Self-pay | Admitting: Family Medicine

## 2019-08-05 NOTE — Telephone Encounter (Signed)
I called the pharmacy and they gave me a number to call for an formulary that will pay instead of the jardiance.  [phone].  Latrece Nitta,cma

## 2019-08-05 NOTE — Telephone Encounter (Signed)
The patient' insurance has changed. Therefore, she can't afford the Jardiance, even with a co pay card due to her deductible being so large. She is wanting to see what would be a similar replacement at a lower cost.

## 2019-08-06 ENCOUNTER — Ambulatory Visit: Payer: 59 | Admitting: Family Medicine

## 2019-08-06 ENCOUNTER — Other Ambulatory Visit (INDEPENDENT_AMBULATORY_CARE_PROVIDER_SITE_OTHER): Payer: 59

## 2019-08-06 DIAGNOSIS — D509 Iron deficiency anemia, unspecified: Secondary | ICD-10-CM | POA: Diagnosis not present

## 2019-08-06 LAB — HEMOCCULT SLIDES (X 3 CARDS)
Fecal Occult Blood: NEGATIVE
OCCULT 1: NEGATIVE
OCCULT 2: NEGATIVE
OCCULT 3: NEGATIVE
OCCULT 4: NEGATIVE
OCCULT 5: NEGATIVE

## 2019-08-07 NOTE — Telephone Encounter (Signed)
Pt called about jardiance ? Please call back asap

## 2019-08-07 NOTE — Telephone Encounter (Signed)
I called to the patient's insurance and they suggested you write for Nicole Bryant and it may need a PA but it would be more affordable for th patient.  The patient has a very high deductible and this medication may not be as high.  I called and explained this to the patient.  Nicole Bryant,cma

## 2019-08-08 NOTE — Telephone Encounter (Signed)
Pt called to check on this. She runs out of medication this weekend

## 2019-08-09 ENCOUNTER — Telehealth: Payer: Self-pay

## 2019-08-09 MED ORDER — FARXIGA 5 MG PO TABS: 5.0000 mg | ORAL_TABLET | Freq: Every day | ORAL | 2 refills | Status: DC

## 2019-08-09 NOTE — Telephone Encounter (Signed)
Farxiga sent to pharmacy

## 2019-08-09 NOTE — Telephone Encounter (Signed)
I called and informed the patient that her medication Wilder Glade was sent to the pharmacy.  Noora Locascio,cma

## 2019-08-09 NOTE — Telephone Encounter (Signed)
See prior message about a different medication and I put information for this medication from a drug rep in your lab basket for review, this is the medication the insurance company states would be affordable for the patient [place].  Vikkie Goeden,cma

## 2019-08-10 MED ORDER — CANAGLIFLOZIN 100 MG PO TABS: 100.0000 mg | ORAL_TABLET | Freq: Every day | ORAL | 2 refills | Status: DC

## 2019-08-10 NOTE — Telephone Encounter (Signed)
invokana sent to pharmacy.

## 2019-08-12 NOTE — Telephone Encounter (Signed)
Noted  

## 2019-08-12 NOTE — Telephone Encounter (Signed)
I called the patient and she stated that she pickup the Jardiance because the Invokana was more expensive, so she paid out of pocket for the Jardiance and she stated at her appointment on 08/22/2019 with the provider she would discuss the medication, but she is taking the Jardiance now.  Nicole Bryant,cma

## 2019-08-19 ENCOUNTER — Ambulatory Visit: Payer: Self-pay | Admitting: Family Medicine

## 2019-08-22 ENCOUNTER — Other Ambulatory Visit: Payer: Self-pay

## 2019-08-22 ENCOUNTER — Ambulatory Visit (INDEPENDENT_AMBULATORY_CARE_PROVIDER_SITE_OTHER): Payer: 59 | Admitting: Family Medicine

## 2019-08-22 ENCOUNTER — Encounter: Payer: Self-pay | Admitting: Family Medicine

## 2019-08-22 ENCOUNTER — Telehealth: Payer: Self-pay | Admitting: Obstetrics & Gynecology

## 2019-08-22 VITALS — BP 110/70 | HR 86 | Temp 97.0°F | Ht 64.0 in | Wt 199.6 lb

## 2019-08-22 DIAGNOSIS — E611 Iron deficiency: Secondary | ICD-10-CM | POA: Diagnosis not present

## 2019-08-22 DIAGNOSIS — E1165 Type 2 diabetes mellitus with hyperglycemia: Secondary | ICD-10-CM

## 2019-08-22 DIAGNOSIS — E785 Hyperlipidemia, unspecified: Secondary | ICD-10-CM

## 2019-08-22 DIAGNOSIS — E119 Type 2 diabetes mellitus without complications: Secondary | ICD-10-CM

## 2019-08-22 DIAGNOSIS — I10 Essential (primary) hypertension: Secondary | ICD-10-CM

## 2019-08-22 DIAGNOSIS — Z124 Encounter for screening for malignant neoplasm of cervix: Secondary | ICD-10-CM | POA: Insufficient documentation

## 2019-08-22 DIAGNOSIS — E669 Obesity, unspecified: Secondary | ICD-10-CM

## 2019-08-22 LAB — COMPREHENSIVE METABOLIC PANEL WITH GFR
ALT: 21 U/L (ref 0–35)
AST: 16 U/L (ref 0–37)
Albumin: 4.5 g/dL (ref 3.5–5.2)
Alkaline Phosphatase: 62 U/L (ref 39–117)
BUN: 11 mg/dL (ref 6–23)
CO2: 24 meq/L (ref 19–32)
Calcium: 9.5 mg/dL (ref 8.4–10.5)
Chloride: 101 meq/L (ref 96–112)
Creatinine, Ser: 0.75 mg/dL (ref 0.40–1.20)
GFR: 82.18 mL/min (ref >60.00)
Glucose, Bld: 172 mg/dL — ABNORMAL HIGH (ref 70–99)
Potassium: 4 meq/L (ref 3.5–5.1)
Sodium: 136 meq/L (ref 135–145)
Total Bilirubin: 0.9 mg/dL (ref 0.2–1.2)
Total Protein: 6.9 g/dL (ref 6.0–8.3)

## 2019-08-22 LAB — LIPID PANEL
Cholesterol: 190 mg/dL (ref 0–200)
HDL: 39.4 mg/dL (ref >39.00)
NonHDL: 150.51
Total CHOL/HDL Ratio: 5
Triglycerides: 274 mg/dL — ABNORMAL HIGH (ref 0.0–149.0)
VLDL: 54.8 mg/dL — ABNORMAL HIGH (ref 0.0–40.0)

## 2019-08-22 LAB — HEMOGLOBIN A1C: Hgb A1c MFr Bld: 6.7 % — ABNORMAL HIGH (ref 4.6–6.5)

## 2019-08-22 LAB — LDL CHOLESTEROL, DIRECT: Direct LDL: 119 mg/dL

## 2019-08-22 NOTE — Assessment & Plan Note (Addendum)
Check tissue transglutaminase, IgA.  She will continue to see GI as well.

## 2019-08-22 NOTE — Assessment & Plan Note (Signed)
Well-controlled.  Continue current regimen.  Check labs.

## 2019-08-22 NOTE — Progress Notes (Signed)
[name], MD Phone: [phone]  Nicole Bryant is a 49 y.o. female who presents today for f/u.  DIABETES Disease Monitoring: Blood Sugar ranges-not checking Polyuria/phagia/dipsia- no      Optho- due Medications: Compliance- taking jardiance, metformin Hypoglycemic symptoms- no  HYPERTENSION  Disease Monitoring  Home BP Monitoring similar to today Chest pain- no    Dyspnea- no Medications  Compliance-  Taking amlodipine, lisinopril.   Edema- no  Iron deficiency: Patient saw GI.  She had negative stool cards.  Iron levels were acceptable.  She previously had EGD and colonoscopy for iron deficiency several years ago.  Given negative stool cards they opted to defer further endoscopic work-up at this time.  They have requested that we draw celiac labs.  Cervical cancer screening: Patient is due for Pap smear.  She would like a referral to GYN.  Obesity: Patient has gotten away from walking for exercise.  She does do quite a bit of yard work for exercise as well.  She gets relatively balanced diet that it does drink diet soda and eats some junk food.   Social History   Tobacco Use  Smoking Status Never Smoker  Smokeless Tobacco Never Used     ROS see history of present illness  Objective  Physical Exam Vitals:   08/22/19 0809  BP: 110/70  Pulse: 86  Temp: (!) 97 F (36.1 C)    BP Readings from Last 3 Encounters:  08/22/19 110/70  07/25/19 138/88  04/04/19 131/85   Wt Readings from Last 3 Encounters:  08/22/19 199 lb 9.6 oz (90.5 kg)  07/25/19 201 lb 2 oz (91.2 kg)  04/04/19 189 lb 5 oz (85.9 kg)    Physical Exam Constitutional:      General: She is not in acute distress.    Appearance: She is not diaphoretic.  Cardiovascular:     Rate and Rhythm: Normal rate and regular rhythm.     Heart sounds: Normal heart sounds.  Pulmonary:     Effort: Pulmonary effort is normal.     Breath sounds: Normal breath sounds.  Musculoskeletal:     Right  lower leg: No edema.     Left lower leg: No edema.  Skin:    General: Skin is warm and dry.  Neurological:     Mental Status: She is alert.      Assessment/Plan: Please see individual problem list.  Cervical cancer screening Refer to GYN.  Hypertension Well-controlled.  Continue current regimen.  Check labs.  Type 2 diabetes mellitus with hyperglycemia, without long-term current use of insulin (HCC) Continue current medications.  Check labs.  Iron deficiency Check tissue transglutaminase, IgA.  She will continue to see GI as well.  Obesity (BMI 30.0-34.9) Discussed adding back walking several days a week.  Encouraged decreasing diet soda and limiting junk food.   Health Maintenance: Encouraged her to get the COVID-19 vaccine.  Encouraged to see ophthalmology.  Orders Placed This Encounter  Procedures  . Tissue transglutaminase, IgA  . Comp Met (CMET)  . Lipid panel  . HgB A1c  . Ambulatory referral to Gynecology    Referral Priority:   Routine    Referral Type:   Consultation    Referral Reason:   Specialty Services Required    Requested Specialty:   Gynecology    Number of Visits Requested:   1    No orders of the defined types were placed in this encounter.   This visit occurred during the SARS-CoV-2 public  health emergency.  Safety protocols were in place, including screening questions prior to the visit, additional usage of staff PPE, and extensive cleaning of exam room while observing appropriate contact time as indicated for disinfecting solutions.    [name], MD [place]

## 2019-08-22 NOTE — Patient Instructions (Signed)
Nice to see you. Please let us know if the Vania [facility]a is not affordable once your insurance changes. We will contact you with your lab results.

## 2019-08-22 NOTE — Assessment & Plan Note (Signed)
Continue current medications.   Check labs

## 2019-08-22 NOTE — Assessment & Plan Note (Signed)
Refer to GYN. 

## 2019-08-22 NOTE — Telephone Encounter (Signed)
[facility] referring for Cervical cancer screening with Dr. Kenton Kingfisher. Called and left voicemail for patient to call back to be schedule

## 2019-08-22 NOTE — Assessment & Plan Note (Signed)
Discussed adding back walking several days a week.  Encouraged decreasing diet soda and limiting junk food.

## 2019-08-23 LAB — TISSUE TRANSGLUTAMINASE, IGA: (tTG) Ab, IgA: 1 U/mL

## 2019-08-31 ENCOUNTER — Other Ambulatory Visit: Payer: Self-pay | Admitting: Family Medicine

## 2019-09-19 ENCOUNTER — Ambulatory Visit (INDEPENDENT_AMBULATORY_CARE_PROVIDER_SITE_OTHER): Payer: 59 | Admitting: Obstetrics & Gynecology

## 2019-09-19 ENCOUNTER — Other Ambulatory Visit: Payer: Self-pay

## 2019-09-19 ENCOUNTER — Other Ambulatory Visit (HOSPITAL_COMMUNITY)
Admission: RE | Admit: 2019-09-19 | Discharge: 2019-09-19 | Disposition: A | Discharge status: Home / Self Care | Payer: 59 | Source: Ambulatory Visit | Attending: Obstetrics & Gynecology | Admitting: Obstetrics & Gynecology

## 2019-09-19 ENCOUNTER — Encounter: Payer: Self-pay | Admitting: Obstetrics & Gynecology

## 2019-09-19 VITALS — BP 120/80 | Ht 65.0 in | Wt 197.0 lb

## 2019-09-19 DIAGNOSIS — Z124 Encounter for screening for malignant neoplasm of cervix: Secondary | ICD-10-CM

## 2019-09-19 DIAGNOSIS — Z01419 Encounter for gynecological examination (general) (routine) without abnormal findings: Secondary | ICD-10-CM

## 2019-09-19 DIAGNOSIS — Z1231 Encounter for screening mammogram for malignant neoplasm of breast: Secondary | ICD-10-CM

## 2019-09-19 NOTE — Progress Notes (Signed)
HPI:      [name] is a 49 y.o. G1P1001 who LMP was Patient's last menstrual period was 08/31/2019., she presents today for her annual examination. The patient has no complaints today; she reports erratic periods now and mild hot flash/night sweats. The patient is not currently sexually active. Her last pap: approximate date (years ago) and was normal and last mammogram: approximate date (years ago, age 86) and was normal. The patient does perform self breast exams.  There is no notable family history of breast or ovarian cancer in her family.  The patient has regular exercise: yes.  The patient denies current symptoms of depression.    GYN History: Contraception: abstinence  PMHx: Past Medical History:  Diagnosis Date  . Allergic rhinitis   . Chickenpox   . Chronic cholecystitis without calculus s/p lap cholecystectomy 02/04/2016 02/04/2016  . Diabetes ([place])    currently no meds- trying to control with diet  . Fatty liver   . Gastric polyps   . GERD (gastroesophageal reflux disease)   . Guaiac positive stools 08/03/2015  . Hyperlipemia   . Low iron   . Norovirus 07/2015  . Parathyroid abnormality ([place])   . Raynaud disease   . Tubular adenoma of colon   . UTI (lower urinary tract infection)    Past Surgical History:  Procedure Laterality Date  . BREAST CYST EXCISION    . LAPAROSCOPIC CHOLECYSTECTOMY SINGLE SITE WITH INTRAOPERATIVE CHOLANGIOGRAM N/A 02/04/2016   Procedure: LAPAROSCOPIC CHOLECYSTECTOMY SINGLE SITE WITH INTRAOPERATIVE CHOLANGIOGRAM AND STUDY OF BILE DUCTS;  Surgeon: Michael Boston, MD;  Location: [facility];  Service: General;  Laterality: N/A;  . PARATHYROIDECTOMY  2009  . TUBAL LIGATION  2008   Family History  Problem Relation Age of Onset  . Stroke Father   . Heart disease Maternal Grandfather   . Hypertension Paternal Aunt   . Diabetes Paternal Aunt   . Cancer Cousin        Peritoneal carcinoma  . Prostate cancer Other        great uncle  .  Heart disease Paternal Grandmother   . Diverticulosis Mother   . GER disease Maternal Aunt   . Colon cancer Neg Hx    Social History   Tobacco Use  . Smoking status: Never Smoker  . Smokeless tobacco: Never Used  Substance Use Topics  . Alcohol use: No    Alcohol/week: 0.0 standard drinks  . Drug use: No    Current Outpatient Medications:  .  amLODipine (NORVASC) 10 MG tablet, TAKE 1 TABLET BY MOUTH ONCE DAILY, Disp: 90 tablet, Rfl: 3 .  benzonatate (TESSALON) 100 MG capsule, Take 1 capsule (100 mg total) by mouth 2 (two) times daily as needed for cough., Disp: 20 capsule, Rfl: 0 .  Blood Glucose Monitoring Suppl (RELION CONFIRM GLUCOSE MONITOR) w/Device KIT, Check your blood glucose once daily before breakfast, Disp: 1 kit, Rfl: 0 .  cholecalciferol (VITAMIN D) 1000 units tablet, Take 5,000 Units by mouth daily., Disp: , Rfl:  .  CONTOUR NEXT TEST test strip, USE TO CHECK BLOOD SUGAR ONCE DAILY BEFORE BREAKFAST, Disp: 100 each, Rfl: 12 .  ferrous sulfate (FEROSUL) 325 (65 FE) MG tablet, Take 325 mg by mouth daily with breakfast., Disp: , Rfl:  .  fluticasone (FLONASE) 50 MCG/ACT nasal spray, USE 2 SPRAYS IN EACH NOSTRIL ONCE DAILY, Disp: 16 g, Rfl: 2 .  JARDIANCE 25 MG TABS tablet, Take 25 mg by mouth daily., Disp: , Rfl:  .  Lancet Devices (ONE TOUCH DELICA LANCING DEV) MISC, , Disp: , Rfl: 0 .  lisinopril (ZESTRIL) 5 MG tablet, TAKE 1 TABLET BY MOUTH ONCE DAILY, Disp: 30 tablet, Rfl: 1 .  loratadine (CLARITIN) 10 MG tablet, Take 10 mg by mouth daily., Disp: , Rfl:  .  metFORMIN (GLUCOPHAGE) 500 MG tablet, TAKE 2 TABLETS BY MOUTH TWICE DAILY WITHA MEAL, Disp: 360 tablet, Rfl: 3 .  MICROLET LANCETS MISC, CHECK YOUR BLOOD SUGAR ONCE DAILY BEFOREBREAKFAST, Disp: 100 each, Rfl: 1 .  omeprazole (PRILOSEC) 40 MG capsule, Take 40 mg by mouth daily., Disp: , Rfl:  .  ondansetron (ZOFRAN ODT) 8 MG disintegrating tablet, Take 1 tablet (8 mg total) by mouth every 8 (eight) hours as needed for  nausea or vomiting., Disp: 20 tablet, Rfl: 0 .  PROAIR HFA 108 (90 Base) MCG/ACT inhaler, INHALE 2 PUFFS INTO THE LUNGS EVERY 6 HOURS AS NEEDED FOR WHEEZING OR SHORTNESS OF BREATH, Disp: 8.5 g, Rfl: 0 .  triamcinolone cream (KENALOG) 0.1 %, APPLY TOPICALLY 2 TIMES DAILY, Disp: 30 g, Rfl: 0 Allergies: Doxycycline, Codeine, Diphenoxylate-atropine, and Sulfa antibiotics  Review of Systems  Constitutional: Negative for chills, fever and malaise/fatigue.  HENT: Negative for congestion, sinus pain and sore throat.   Eyes: Negative for blurred vision and pain.  Respiratory: Negative for cough and wheezing.   Cardiovascular: Negative for chest pain and leg swelling.  Gastrointestinal: Negative for abdominal pain, constipation, diarrhea, heartburn, nausea and vomiting.  Genitourinary: Negative for dysuria, frequency, hematuria and urgency.  Musculoskeletal: Negative for back pain, joint pain, myalgias and neck pain.  Skin: Negative for itching and rash.  Neurological: Negative for dizziness, tremors and weakness.  Endo/Heme/Allergies: Does not bruise/bleed easily.  Psychiatric/Behavioral: Negative for depression. The patient is not nervous/anxious and does not have insomnia.     Objective: BP 120/80   Ht 5' 5"  (1.651 m)   Wt 197 lb (89.4 kg)   LMP 08/31/2019   BMI 32.78 kg/m   Filed Weights   09/19/19 0758  Weight: 197 lb (89.4 kg)   Body mass index is 32.78 kg/m. Physical Exam Constitutional:      General: She is not in acute distress.    Appearance: She is well-developed. She is obese.  Genitourinary:     Pelvic exam was performed with patient supine.     Vagina, uterus and rectum normal.     No lesions in the vagina.     No vaginal bleeding.     No cervical motion tenderness, friability, lesion or polyp.     Uterus is mobile.     Uterus is not enlarged.     No uterine mass detected.    Uterus is midaxial.     No right or left adnexal mass present.     Right adnexa not tender.      Left adnexa not tender.  HENT:     Head: Normocephalic and atraumatic. No laceration.     Right Ear: Hearing normal.     Left Ear: Hearing normal.     Mouth/Throat:     Pharynx: Uvula midline.  Eyes:     Pupils: Pupils are equal, round, and reactive to light.  Neck:     Thyroid: No thyromegaly.  Cardiovascular:     Rate and Rhythm: Normal rate and regular rhythm.     Heart sounds: No murmur. No friction rub. No gallop.   Pulmonary:     Effort: Pulmonary effort is normal. No respiratory distress.  Breath sounds: Normal breath sounds. No wheezing.  Chest:     Breasts:        Right: No mass, skin change or tenderness.        Left: No mass, skin change or tenderness.  Abdominal:     General: Bowel sounds are normal. There is no distension.     Palpations: Abdomen is soft.     Tenderness: There is no abdominal tenderness. There is no rebound.  Musculoskeletal:        General: Normal range of motion.     Cervical back: Normal range of motion and neck supple.  Neurological:     Mental Status: She is alert and oriented to person, place, and time.     Cranial Nerves: No cranial nerve deficit.  Skin:    General: Skin is warm and dry.  Psychiatric:        Judgment: Judgment normal.  Vitals reviewed.     Assessment:  ANNUAL EXAM 1. Women's annual routine gynecological examination   2. Screening for cervical cancer   3. Encounter for screening mammogram for malignant neoplasm of breast      Screening Plan:            1.  Cervical Screening-  Pap smear done today  2. Breast screening- Exam annually and mammogram>40 planned   3. Colonoscopy every 10 years, Hemoccult testing - after age 43  4. Labs managed by PCP  5. Counseling for contraception: no method, abstinence   6. Discusssed Perimenopause and expectations, possible tx options based on sx's    F/U  Return in about 1 year (around 09/18/2020) for Annual.  Barnett Applebaum, MD, [facility], [facility] 09/19/2019  8:15 AM

## 2019-09-19 NOTE — Patient Instructions (Signed)
PAP every three years Mammogram every year    Call [phone] to schedule at [facility] yearly (with PCP)   Perimenopause  Perimenopause is the normal time of life before and after menstrual periods stop completely (menopause). Perimenopause can begin 2-8 years before menopause, and it usually lasts for 1 year after menopause. During perimenopause, the ovaries may or may not produce an egg. What are the causes? This condition is caused by a natural change in hormone levels that happens as you get older. What increases the risk? This condition is more likely to start at an earlier age if you have certain medical conditions or treatments, including:  A tumor of the pituitary gland in the brain.  A disease that affects the ovaries and hormone production.  Radiation treatment for cancer.  Certain cancer treatments, such as chemotherapy or hormone (anti-estrogen) therapy.  Heavy smoking and excessive alcohol use.  Family history of early menopause. What are the signs or symptoms? Perimenopausal changes affect each woman differently. Symptoms of this condition may include:  Hot flashes.  Night sweats.  Irregular menstrual periods.  Decreased sex drive.  Vaginal dryness.  Headaches.  Mood swings.  Depression.  Memory problems or trouble concentrating.  Irritability.  Tiredness.  Weight gain.  Anxiety.  Trouble getting pregnant. How is this diagnosed? This condition is diagnosed based on your medical history, a physical exam, your age, your menstrual history, and your symptoms. Hormone tests may also be done. How is this treated? In some cases, no treatment is needed. You and your health care provider should make a decision together about whether treatment is necessary. Treatment will be based on your individual condition and preferences. Various treatments are available, such as:  Menopausal hormone therapy (MHT).  Medicines to treat specific  symptoms.  Acupuncture.  Vitamin or herbal supplements. Before starting treatment, make sure to let your health care provider know if you have a personal or family history of:  Heart disease.  Breast cancer.  Blood clots.  Diabetes.  Osteoporosis. Follow these instructions at home: Lifestyle  Do not use any products that contain nicotine or tobacco, such as cigarettes and e-cigarettes. If you need help quitting, ask your health care provider.  Eat a balanced diet that includes fresh fruits and vegetables, whole grains, soybeans, eggs, lean meat, and low-fat dairy.  Get at least 30 minutes of physical activity on 5 or more days each week.  Avoid alcoholic and caffeinated beverages, as well as spicy foods. This may help prevent hot flashes.  Get 7-8 hours of sleep each night.  Dress in layers that can be removed to help you manage hot flashes.  Find ways to manage stress, such as deep breathing, meditation, or journaling. General instructions  Keep track of your menstrual periods, including: ? When they occur. ? How heavy they are and how long they last. ? How much time passes between periods.  Keep track of your symptoms, noting when they start, how often you have them, and how long they last.  Take over-the-counter and prescription medicines only as told by your health care provider.  Take vitamin supplements only as told by your health care provider. These may include calcium, vitamin E, and vitamin D.  Use vaginal lubricants or moisturizers to help with vaginal dryness and improve comfort during sex.  Talk with your health care provider before starting any herbal supplements.  Keep all follow-up visits as told by your health care provider. This is important. This includes any group  therapy or counseling. Contact a health care provider if:  You have heavy vaginal bleeding or pass blood clots.  Your period lasts more than 2 days longer than normal.  Your  periods are recurring sooner than 21 days.  You bleed after having sex. Get help right away if:  You have chest pain, trouble breathing, or trouble talking.  You have severe depression.  You have pain when you urinate.  You have severe headaches.  You have vision problems. Summary  Perimenopause is the time when a woman's body begins to move into menopause. This may happen naturally or as a result of other health problems or medical treatments.  Perimenopause can begin 2-8 years before menopause, and it usually lasts for 1 year after menopause.  Perimenopausal symptoms can be managed through medicines, lifestyle changes, and complementary therapies such as acupuncture. This information is not intended to replace advice given to you by your health care provider. Make sure you discuss any questions you have with your health care provider. Document Revised: 04/14/2017 Document Reviewed: 06/07/2016 Elsevier Patient Education  2020 [employer].

## 2019-09-23 LAB — CYTOLOGY - PAP
Comment: NEGATIVE
Diagnosis: NEGATIVE
High risk HPV: NEGATIVE

## 2019-09-28 ENCOUNTER — Other Ambulatory Visit: Payer: Self-pay | Admitting: Family Medicine

## 2019-10-29 ENCOUNTER — Encounter: Payer: Self-pay | Admitting: Family Medicine

## 2019-11-06 ENCOUNTER — Encounter: Payer: Self-pay | Admitting: Family Medicine

## 2019-11-28 ENCOUNTER — Other Ambulatory Visit: Payer: Self-pay | Admitting: Family Medicine

## 2019-12-05 ENCOUNTER — Telehealth: Payer: Self-pay | Admitting: Family Medicine
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
## 2019-12-05 NOTE — Telephone Encounter (Signed)
Pt called in and wanted breathing treatment please advise

## 2019-12-05 NOTE — Telephone Encounter (Signed)
She needs to complete a visit for this. With her symptoms it would need to be virtual or at and urgent care. Please offer her a visit.

## 2019-12-05 NOTE — Telephone Encounter (Signed)
Called and scheduled patient for a video visit at 11:30am

## 2019-12-06 ENCOUNTER — Telehealth (INDEPENDENT_AMBULATORY_CARE_PROVIDER_SITE_OTHER): Payer: 59 | Admitting: Family Medicine

## 2019-12-06 ENCOUNTER — Encounter: Payer: Self-pay | Admitting: Family Medicine

## 2019-12-06 ENCOUNTER — Other Ambulatory Visit: Payer: Self-pay

## 2019-12-06 DIAGNOSIS — J45909 Unspecified asthma, uncomplicated: Secondary | ICD-10-CM | POA: Insufficient documentation

## 2019-12-06 DIAGNOSIS — J4530 Mild persistent asthma, uncomplicated: Secondary | ICD-10-CM

## 2019-12-06 MED ORDER — BUDESONIDE-FORMOTEROL FUMARATE 80-4.5 MCG/ACT IN AERO: 2.0000 | INHALATION_SPRAY | Freq: Two times a day (BID) | RESPIRATORY_TRACT | 3 refills | Status: DC

## 2019-12-06 NOTE — Assessment & Plan Note (Signed)
Worsening of a chronic issue with her asthma.  We will add Symbicort to be taken twice daily as prescribed.  Discussed she could use the albuterol as a rescue inhaler on top of this if needed.  She has follow-up in several weeks and we will see how she is doing at that time.  I did encourage her to get the COVID-19 vaccine given her asthma history.
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
## 2019-12-10 MED ORDER — ADVAIR HFA 45-21 MCG/ACT IN AERO: 2.0000 | INHALATION_SPRAY | Freq: Two times a day (BID) | RESPIRATORY_TRACT | 12 refills | Status: DC

## 2019-12-11 ENCOUNTER — Other Ambulatory Visit: Payer: Self-pay | Admitting: Family

## 2019-12-11 DIAGNOSIS — J4530 Mild persistent asthma, uncomplicated: Secondary | ICD-10-CM

## 2019-12-11 MED ORDER — FLUTICASONE-SALMETEROL 100-50 MCG/DOSE IN AEPB: 1.0000 | INHALATION_SPRAY | Freq: Two times a day (BID) | RESPIRATORY_TRACT | 1 refills | Status: DC

## 2019-12-17 ENCOUNTER — Ambulatory Visit
Admission: RE | Admit: 2019-12-17 | Discharge: 2019-12-17 | Disposition: A | Discharge status: Home / Self Care | Payer: 59 | Source: Ambulatory Visit | Attending: Emergency Medicine | Admitting: Emergency Medicine

## 2019-12-17 ENCOUNTER — Other Ambulatory Visit: Payer: Self-pay

## 2019-12-17 ENCOUNTER — Encounter: Payer: Self-pay | Admitting: Family Medicine

## 2019-12-17 ENCOUNTER — Telehealth: Payer: 59 | Admitting: Internal Medicine

## 2019-12-17 VITALS — BP 135/89 | HR 84 | Temp 99.0°F | Resp 16

## 2019-12-17 DIAGNOSIS — J069 Acute upper respiratory infection, unspecified: Secondary | ICD-10-CM

## 2019-12-17 MED ORDER — BENZONATATE 100 MG PO CAPS
100.0000 mg | ORAL_CAPSULE | Freq: Three times a day (TID) | ORAL | 0 refills | Status: DC | PRN
Start: 2019-12-17 — End: 2020-05-27

## 2019-12-17 MED ORDER — PREDNISONE 10 MG PO TABS: 40.0000 mg | ORAL_TABLET | Freq: Every day | ORAL | 0 refills | Status: AC

## 2019-12-17 NOTE — Discharge Instructions (Signed)
Your COVID test is pending.  You should self quarantine until the test result is back.    Use your albuterol inhaler as directed.  Take the prednisone as directed.    Monitor your blood sugar often while on the prednisone.    Follow-up with your primary care provider as scheduled.  Follow-up sooner if your symptoms are not improving.

## 2019-12-17 NOTE — ED Provider Notes (Signed)
Roderic Palau    CSN: [id number] Arrival date & time: 12/17/19  1201      History   Chief Complaint Chief Complaint  Patient presents with  . Nasal Congestion    HPI Nicole Bryant is a 49 y.o. female.   Patient presents with nonproductive cough which is worse at night.  She also reports nasal congestion, sinus pressure, ear pressure, headache x10 days.  She denies fever, chills, sore throat, shortness of breath, vomiting, diarrhea, rash, or other symptoms.  She has been treating her symptoms at home with Sudafed and NyQuil.  Patient's medical history includes asthma and diabetes.  The history is provided by the patient.    Past Medical History:  Diagnosis Date  . Allergic rhinitis   . Chickenpox   . Chronic cholecystitis without calculus s/p lap cholecystectomy 02/04/2016 02/04/2016  . Diabetes ([place])    currently no meds- trying to control with diet  . Fatty liver   . Gastric polyps   . GERD (gastroesophageal reflux disease)   . Guaiac positive stools 08/03/2015  . Hyperlipemia   . Low iron   . Norovirus 07/2015  . Parathyroid abnormality ([place])   . Raynaud disease   . Tubular adenoma of colon   . UTI (lower urinary tract infection)     Patient Active Problem List   Diagnosis Date Noted  . Asthma 12/06/2019  . Cervical cancer screening 08/22/2019  . Obesity (BMI 30.0-34.9) 08/22/2019  . Missed period 05/02/2018  . Acute right ankle pain 10/03/2017  . Rash 10/03/2017  . Hypertension 06/26/2017  . Iron deficiency 06/26/2017  . Sleeping difficulty 12/29/2015  . Type 2 diabetes mellitus with hyperglycemia, without long-term current use of insulin ([place])   . Hyperlipidemia 08/03/2015    Past Surgical History:  Procedure Laterality Date  . BREAST CYST EXCISION    . LAPAROSCOPIC CHOLECYSTECTOMY SINGLE SITE WITH INTRAOPERATIVE CHOLANGIOGRAM N/A 02/04/2016   Procedure: LAPAROSCOPIC CHOLECYSTECTOMY SINGLE SITE WITH INTRAOPERATIVE CHOLANGIOGRAM AND STUDY  OF BILE DUCTS;  Surgeon: Michael Boston, MD;  Location: [facility];  Service: General;  Laterality: N/A;  . PARATHYROIDECTOMY  2009  . TUBAL LIGATION  2008    OB History    Gravida  1   Para  1   Term  1   Preterm      AB      Living  1     SAB      TAB      Ectopic      Multiple      Live Births               Home Medications    Prior to Admission medications   Medication Sig Start Date End Date Taking? Authorizing Provider  amLODipine (NORVASC) 10 MG tablet TAKE 1 TABLET BY MOUTH ONCE DAILY 07/16/19  Yes Leone Haven, MD  Blood Glucose Monitoring Suppl ([place]) w/Device KIT Check your blood glucose once daily before breakfast 08/03/15  Yes Leone Haven, MD  cholecalciferol (VITAMIN D) 1000 units tablet Take 5,000 Units by mouth daily.   Yes [provider]  ferrous sulfate (FEROSUL) 325 (65 FE) MG tablet Take 325 mg by mouth daily with breakfast.   Yes [provider]  fluticasone (FLONASE) 50 MCG/ACT nasal spray USE 2 SPRAYS IN EACH NOSTRIL ONCE DAILY 09/02/19  Yes Leone Haven, MD  JARDIANCE 25 MG TABS tablet Take 25 mg by mouth daily. 08/10/19  Yes [provider]  lisinopril (ZESTRIL) 5 MG tablet TAKE 1 TABLET BY MOUTH ONCE DAILY 11/28/19  Yes Leone Haven, MD  loratadine (CLARITIN) 10 MG tablet Take 10 mg by mouth daily.   Yes [provider]  metFORMIN (GLUCOPHAGE) 500 MG tablet TAKE 2 TABLETS BY MOUTH TWICE DAILY WITHA MEAL 06/19/19  Yes Leone Haven, MD  omeprazole (PRILOSEC) 40 MG capsule Take 40 mg by mouth daily.   Yes [provider]  PROAIR HFA 108 (90 Base) MCG/ACT inhaler INHALE 2 PUFFS INTO THE LUNGS EVERY 6 HOURS AS NEEDED FOR WHEEZING OR SHORTNESS OF BREATH 03/28/19  Yes Leone Haven, MD  benzonatate (TESSALON) 100 MG capsule Take 1 capsule (100 mg total) by mouth 3 (three) times daily as needed for cough. 12/17/19   Sharion Balloon, NP  CONTOUR NEXT TEST test  strip USE TO CHECK BLOOD SUGAR ONCE DAILY BEFORE BREAKFAST 07/13/17   Leone Haven, MD  Fluticasone-Salmeterol (ADVAIR) 100-50 MCG/DOSE AEPB Inhale 1 puff into the lungs every 12 (twelve) hours. Patient not taking: Reported on 12/17/2019 12/11/19 01/10/20  Burnard Hawthorne, FNP  Lancet Devices ([place] LANCING DEV) [place]  08/03/15   [provider]  MICROLET LANCETS MISC CHECK YOUR BLOOD SUGAR ONCE DAILY BEFOREBREAKFAST 07/13/17   Leone Haven, MD  ondansetron (ZOFRAN ODT) 8 MG disintegrating tablet Take 1 tablet (8 mg total) by mouth every 8 (eight) hours as needed for nausea or vomiting. 04/04/19   Copland, Frederico Hamman, MD  predniSONE (DELTASONE) 10 MG tablet Take 4 tablets (40 mg total) by mouth daily for 5 days. 12/17/19 12/22/19  Sharion Balloon, NP  triamcinolone cream (KENALOG) 0.1 % APPLY TOPICALLY 2 TIMES DAILY 04/29/19   Leone Haven, MD    Family History Family History  Problem Relation Age of Onset  . Stroke Father   . Heart disease Maternal Grandfather   . Hypertension Paternal Aunt   . Diabetes Paternal Aunt   . Cancer Cousin        Peritoneal carcinoma  . Prostate cancer Other        great uncle  . Heart disease Paternal Grandmother   . Diverticulosis Mother   . GER disease Maternal Aunt   . Colon cancer Neg Hx     Social History Social History   Tobacco Use  . Smoking status: Never Smoker  . Smokeless tobacco: Never Used  Vaping Use  . Vaping Use: Never used  Substance Use Topics  . Alcohol use: No    Alcohol/week: 0.0 standard drinks  . Drug use: No     Allergies   Doxycycline, Codeine, Diphenoxylate-atropine, and Sulfa antibiotics   Review of Systems Review of Systems  Constitutional: Negative for chills and fever.  HENT: Positive for congestion, ear pain and sinus pressure. Negative for sore throat.   Eyes: Negative for pain and visual disturbance.  Respiratory: Positive for cough. Negative for shortness of breath.    Cardiovascular: Negative for chest pain and palpitations.  Gastrointestinal: Negative for abdominal pain and vomiting.  Genitourinary: Negative for dysuria and hematuria.  Musculoskeletal: Negative for arthralgias and back pain.  Skin: Negative for color change and rash.  Neurological: Positive for headaches. Negative for seizures and syncope.  All other systems reviewed and are negative.    Physical Exam Triage Vital Signs ED Triage Vitals  Enc Vitals Group     BP      Pulse      Resp  Temp      Temp src      SpO2      Weight      Height      Head Circumference      Peak Flow      Pain Score      Pain Loc      Pain Edu?      Excl. in [place]?    No data found.  Updated Vital Signs BP 135/89 (BP Location: Right Arm)   Pulse 84   Temp 99 F (37.2 C) (Oral)   Resp 16   LMP 11/14/2019 (Exact Date)   SpO2 96%   Visual Acuity Right Eye Distance:   Left Eye Distance:   Bilateral Distance:    Right Eye Near:   Left Eye Near:    Bilateral Near:     Physical Exam Vitals and nursing note reviewed.  Constitutional:      General: She is not in acute distress.    Appearance: She is well-developed.  HENT:     Head: Normocephalic and atraumatic.     Right Ear: Tympanic membrane and ear canal normal.     Left Ear: Tympanic membrane and ear canal normal.     Nose: Congestion present.     Mouth/Throat:     Mouth: Mucous membranes are moist.     Pharynx: Oropharynx is clear.  Eyes:     Conjunctiva/sclera: Conjunctivae normal.  Cardiovascular:     Rate and Rhythm: Normal rate and regular rhythm.     Heart sounds: No murmur heard.   Pulmonary:     Effort: Pulmonary effort is normal. No respiratory distress.     Breath sounds: Normal breath sounds. No wheezing.  Abdominal:     Palpations: Abdomen is soft.     Tenderness: There is no abdominal tenderness. There is no guarding or rebound.  Musculoskeletal:     Cervical back: Neck supple.  Skin:    General: Skin  is warm and dry.     Findings: No rash.  Neurological:     General: No focal deficit present.     Mental Status: She is alert and oriented to person, place, and time.     Gait: Gait normal.  Psychiatric:        Mood and Affect: Mood normal.        Behavior: Behavior normal.      UC Treatments / Results  Labs (all labs ordered are listed, but only abnormal results are displayed) Labs Reviewed  NOVEL CORONAVIRUS, NAA    EKG   Radiology No results found.  Procedures Procedures (including critical care time)  Medications Ordered in UC Medications - No data to display  Initial Impression / Assessment and Plan / UC Course  I have reviewed the triage vital signs and the nursing notes.  Pertinent labs & imaging results that were available during my care of the patient were reviewed by me and considered in my medical decision making (see chart for details).   Viral URI with cough.  PCR COVID pending.  Instructed patient to self quarantine until the test result is back.  Instructed her to continue albuterol.  Additionally 5-day prednisone course prescribed.  Instructed her to monitor her blood sugar carefully while on prednisone.  Instructed her to follow-up with her PCP as scheduled; she has an appointment next week.  Instructed her to follow-up sooner if her symptoms or not improving.  Patient agrees to plan of care.  Final Clinical Impressions(s) / UC Diagnoses   Final diagnoses:  Viral URI with cough     Discharge Instructions     Your COVID test is pending.  You should self quarantine until the test result is back.    Use your albuterol inhaler as directed.  Take the prednisone as directed.    Monitor your blood sugar often while on the prednisone.    Follow-up with your primary care provider as scheduled.  Follow-up sooner if your symptoms are not improving.        ED Prescriptions    Medication Sig Dispense Auth. Provider   predniSONE (DELTASONE) 10 MG  tablet Take 4 tablets (40 mg total) by mouth daily for 5 days. 20 tablet Sharion Balloon, NP   benzonatate (TESSALON) 100 MG capsule Take 1 capsule (100 mg total) by mouth 3 (three) times daily as needed for cough. 21 capsule Sharion Balloon, NP     PDMP not reviewed this encounter.   Sharion Balloon, NP 12/17/19 1243

## 2019-12-17 NOTE — ED Triage Notes (Signed)
Pt presents to UC for nasal congestion, sinus pressure, ear pressure, headache, x10 days. Pt denies fevers, chills, sore throat, n/v/d, loss of taste or smell. Pt has been treating with pseudoephedrine, and nyquill with out relief. Pt agreeable to covid testing at this time.

## 2019-12-20 ENCOUNTER — Telehealth: Payer: Self-pay

## 2019-12-20 ENCOUNTER — Encounter: Payer: Self-pay | Admitting: Family Medicine

## 2019-12-20 LAB — SARS-COV-2, NAA 2 DAY TAT

## 2019-12-20 LAB — NOVEL CORONAVIRUS, NAA: SARS-CoV-2, NAA: NOT DETECTED

## 2019-12-20 MED ORDER — FLUTICASONE-SALMETEROL 55-14 MCG/ACT IN AEPB: 1.0000 | INHALATION_SPRAY | Freq: Two times a day (BID) | RESPIRATORY_TRACT | 2 refills | Status: DC

## 2019-12-20 NOTE — Telephone Encounter (Signed)
Left voice message to remind pt to schedule her mammogram

## 2019-12-20 NOTE — Telephone Encounter (Signed)
-----   Message from Gae Dry, MD sent at 12/17/2019  8:09 AM EDT ----- Regarding: MMG Received notice she has not received MMG yet as ordered at her Annual. Please check and encourage her to do this, and document conversation.

## 2019-12-24 ENCOUNTER — Telehealth (INDEPENDENT_AMBULATORY_CARE_PROVIDER_SITE_OTHER): Payer: 59 | Admitting: Family Medicine

## 2019-12-24 ENCOUNTER — Other Ambulatory Visit: Payer: Self-pay

## 2019-12-24 ENCOUNTER — Encounter: Payer: Self-pay | Admitting: Family Medicine

## 2019-12-24 DIAGNOSIS — J4530 Mild persistent asthma, uncomplicated: Secondary | ICD-10-CM | POA: Diagnosis not present

## 2019-12-24 NOTE — Telephone Encounter (Signed)
Signed. Please make available for pick up.  

## 2019-12-24 NOTE — Progress Notes (Signed)
Virtual Visit via video Note  This visit type was conducted due to national recommendations for restrictions regarding the COVID-19 pandemic (e.g. social distancing).  This format is felt to be most appropriate for this patient at this time.  All issues noted in this document were discussed and addressed.  No physical exam was performed (except for noted visual exam findings with Video Visits).   I connected with Eben Burow today at  8:00 AM EDT by a video enabled telemedicine application and verified that I am speaking with the correct person using two identifiers. Location patient: work Location provider: work Persons participating in the virtual visit: patient, provider  I discussed the limitations, risks, security and privacy concerns of performing an evaluation and management service by telephone and the availability of in person appointments. I also discussed with the patient that there may be a patient responsible charge related to this service. The patient expressed understanding and agreed to proceed.   Reason for visit: f/u.  HPI: Asthma: Medication compliance- just started advair and has taken 2 doses  Rescue inhaler use- last week with viral URI, had negative COVID test Dyspnea- no  Wheezing- no  Cough- mild from URI   Has noted improving symptoms from URI after treatment with prednisone.  Thinks she noticed some benefit from the advair with the first 2 doses.  Wonders when she can start back mowing her yard.      ROS: See pertinent positives and negatives per HPI.  Past Medical History:  Diagnosis Date  . Allergic rhinitis   . Chickenpox   . Chronic cholecystitis without calculus s/p lap cholecystectomy 02/04/2016 02/04/2016  . Diabetes ([place])    currently no meds- trying to control with diet  . Fatty liver   . Gastric polyps   . GERD (gastroesophageal reflux disease)   . Guaiac positive stools 08/03/2015  . Hyperlipemia   . Low iron   . Norovirus  07/2015  . Parathyroid abnormality ([place])   . Raynaud disease   . Tubular adenoma of colon   . UTI (lower urinary tract infection)     Past Surgical History:  Procedure Laterality Date  . BREAST CYST EXCISION    . LAPAROSCOPIC CHOLECYSTECTOMY SINGLE SITE WITH INTRAOPERATIVE CHOLANGIOGRAM N/A 02/04/2016   Procedure: LAPAROSCOPIC CHOLECYSTECTOMY SINGLE SITE WITH INTRAOPERATIVE CHOLANGIOGRAM AND STUDY OF BILE DUCTS;  Surgeon: Michael Boston, MD;  Location: [facility];  Service: General;  Laterality: N/A;  . PARATHYROIDECTOMY  2009  . TUBAL LIGATION  2008    Family History  Problem Relation Age of Onset  . Stroke Father   . Heart disease Maternal Grandfather   . Hypertension Paternal Aunt   . Diabetes Paternal Aunt   . Cancer Cousin        Peritoneal carcinoma  . Prostate cancer Other        great uncle  . Heart disease Paternal Grandmother   . Diverticulosis Mother   . GER disease Maternal Aunt   . Colon cancer Neg Hx     SOCIAL HX: nonsmoker   Current Outpatient Medications:  .  amLODipine (NORVASC) 10 MG tablet, TAKE 1 TABLET BY MOUTH ONCE DAILY, Disp: 90 tablet, Rfl: 3 .  benzonatate (TESSALON) 100 MG capsule, Take 1 capsule (100 mg total) by mouth 3 (three) times daily as needed for cough., Disp: 21 capsule, Rfl: 0 .  Blood Glucose Monitoring Suppl (RELION CONFIRM GLUCOSE MONITOR) w/Device KIT, Check your blood glucose once daily before breakfast, Disp: 1 kit, Rfl:  0 .  cholecalciferol (VITAMIN D) 1000 units tablet, Take 5,000 Units by mouth daily., Disp: , Rfl:  .  CONTOUR NEXT TEST test strip, USE TO CHECK BLOOD SUGAR ONCE DAILY BEFORE BREAKFAST, Disp: 100 each, Rfl: 12 .  ferrous sulfate (FEROSUL) 325 (65 FE) MG tablet, Take 325 mg by mouth daily with breakfast., Disp: , Rfl:  .  fluticasone (FLONASE) 50 MCG/ACT nasal spray, USE 2 SPRAYS IN EACH NOSTRIL ONCE DAILY, Disp: 16 g, Rfl: 2 .  Fluticasone-Salmeterol 55-14 MCG/ACT AEPB, Inhale 1 puff into the lungs in the morning and  at bedtime., Disp: 1 each, Rfl: 2 .  JARDIANCE 25 MG TABS tablet, Take 25 mg by mouth daily., Disp: , Rfl:  .  Lancet Devices (ONE TOUCH DELICA LANCING DEV) MISC, , Disp: , Rfl: 0 .  lisinopril (ZESTRIL) 5 MG tablet, TAKE 1 TABLET BY MOUTH ONCE DAILY, Disp: 30 tablet, Rfl: 1 .  loratadine (CLARITIN) 10 MG tablet, Take 10 mg by mouth daily., Disp: , Rfl:  .  metFORMIN (GLUCOPHAGE) 500 MG tablet, TAKE 2 TABLETS BY MOUTH TWICE DAILY WITHA MEAL, Disp: 360 tablet, Rfl: 3 .  MICROLET LANCETS MISC, CHECK YOUR BLOOD SUGAR ONCE DAILY BEFOREBREAKFAST, Disp: 100 each, Rfl: 1 .  omeprazole (PRILOSEC) 40 MG capsule, Take 40 mg by mouth daily., Disp: , Rfl:  .  ondansetron (ZOFRAN ODT) 8 MG disintegrating tablet, Take 1 tablet (8 mg total) by mouth every 8 (eight) hours as needed for nausea or vomiting., Disp: 20 tablet, Rfl: 0 .  PROAIR HFA 108 (90 Base) MCG/ACT inhaler, INHALE 2 PUFFS INTO THE LUNGS EVERY 6 HOURS AS NEEDED FOR WHEEZING OR SHORTNESS OF BREATH, Disp: 8.5 g, Rfl: 0 .  triamcinolone cream (KENALOG) 0.1 %, APPLY TOPICALLY 2 TIMES DAILY, Disp: 30 g, Rfl: 0  EXAM:  VITALS per patient if applicable:  GENERAL: alert, oriented, appears well and in no acute distress  HEENT: atraumatic, conjunttiva clear, no obvious abnormalities on inspection of external nose and ears  NECK: normal movements of the head and neck  LUNGS: on inspection no signs of respiratory distress, breathing rate appears normal, no obvious gross SOB, gasping or wheezing  CV: no obvious cyanosis  MS: moves all visible extremities without noticeable abnormality  PSYCH/NEURO: pleasant and cooperative, no obvious depression or anxiety, speech and thought processing grossly intact  ASSESSMENT AND PLAN:  Discussed the following assessment and plan:  Asthma Recent exacerbation from viral URI.  Has been improving.  Notes mild chest congestion and cough still though quite a bit improved.  She will remain on Advair.  Continue as  needed albuterol use.  Discussed if she needs to use the albuterol more than a couple of times a week or if she starts to have nighttime symptoms she needs to let us know so we can bump the dose up of the Advair.  She will follow-up in 3 months.   No orders of the defined types were placed in this encounter.   No orders of the defined types were placed in this encounter.    I discussed the assessment and treatment plan with the patient. The patient was provided an opportunity to ask questions and all were answered. The patient agreed with the plan and demonstrated an understanding of the instructions.   The patient was advised to call back or seek an in-person evaluation if the symptoms worsen or if the condition fails to improve as anticipated.   [name], MD

## 2019-12-24 NOTE — Assessment & Plan Note (Signed)
Recent exacerbation from viral URI.  Has been improving.  Notes mild chest congestion and cough still though quite a bit improved.  She will remain on Advair.  Continue as needed albuterol use.  Discussed if she needs to use the albuterol more than a couple of times a week or if she starts to have nighttime symptoms she needs to let Nicole Bryant know so we can bump the dose up of the Advair.  She will follow-up in 3 months.

## 2020-01-21 ENCOUNTER — Other Ambulatory Visit: Payer: Self-pay | Admitting: Family Medicine

## 2020-02-11 LAB — HM DIABETES EYE EXAM

## 2020-03-20 ENCOUNTER — Other Ambulatory Visit: Payer: Self-pay | Admitting: Family Medicine

## 2020-04-21 ENCOUNTER — Other Ambulatory Visit: Payer: Self-pay | Admitting: Family Medicine

## 2020-05-25 ENCOUNTER — Encounter: Payer: Self-pay | Admitting: Family Medicine

## 2020-05-26 ENCOUNTER — Telehealth: Payer: Self-pay

## 2020-05-26 DIAGNOSIS — J4530 Mild persistent asthma, uncomplicated: Secondary | ICD-10-CM

## 2020-05-26 MED ORDER — PREDNISONE 20 MG PO TABS: 40.0000 mg | ORAL_TABLET | Freq: Every day | ORAL | 0 refills | Status: DC

## 2020-05-26 MED ORDER — PREDNISONE 20 MG PO TABS: 20.0000 mg | ORAL_TABLET | Freq: Every day | ORAL | 0 refills | Status: DC

## 2020-05-26 NOTE — Telephone Encounter (Signed)
Prednisone sent to pharmacy and patient was notified. Patient is scheduled for a virtual visit on tomorrow with the provider.  Peder Allums,cma

## 2020-05-26 NOTE — Telephone Encounter (Signed)
I sent in the prednisone Rx and called the pharmacy to disc. The 20 mg once daily one .  Loida Calamia,cma

## 2020-05-26 NOTE — Telephone Encounter (Signed)
Noted. Please send in prednisone 40 mg once daily by mouth for her asthma exacerbation symptoms. Prednisone 20 mg tablets with 10 tablets no refills. She needs to stay quarantined as advised in the MyChart message I sent her. Please add her to my schedule for tomorrow afternoon at 4:15. If her breathing worsens or she has chest pain or significantly worsened symptoms she should go to the ED. She should continue her inhaler.

## 2020-05-27 ENCOUNTER — Telehealth (INDEPENDENT_AMBULATORY_CARE_PROVIDER_SITE_OTHER): Payer: 59 | Admitting: Family Medicine

## 2020-05-27 ENCOUNTER — Other Ambulatory Visit: Payer: Self-pay | Admitting: Family Medicine

## 2020-05-27 ENCOUNTER — Other Ambulatory Visit: Payer: Self-pay

## 2020-05-27 ENCOUNTER — Encounter: Payer: Self-pay | Admitting: Family Medicine

## 2020-05-27 DIAGNOSIS — U071 COVID-19: Secondary | ICD-10-CM | POA: Diagnosis not present

## 2020-05-27 MED ORDER — PROAIR HFA 108 (90 BASE) MCG/ACT IN AERS: INHALATION_SPRAY | RESPIRATORY_TRACT | 0 refills | Status: DC

## 2020-05-27 MED ORDER — BENZONATATE 100 MG PO CAPS: 100.0000 mg | ORAL_CAPSULE | Freq: Three times a day (TID) | ORAL | 0 refills | Status: DC | PRN

## 2020-05-27 NOTE — Progress Notes (Signed)
Virtual Visit via video Note  This visit type was conducted due to national recommendations for restrictions regarding the COVID-19 pandemic (e.g. social distancing).  This format is felt to be most appropriate for this patient at this time.  All issues noted in this document were discussed and addressed.  No physical exam was performed (except for noted visual exam findings with Video Visits).   I connected with Nicole Bryant today at  3:45 PM EST by a video enabled telemedicine application and verified that I am speaking with the correct person using two identifiers. Location patient: home Location provider: work Persons participating in the virtual visit: patient, provider  I discussed the limitations, risks, security and privacy concerns of performing an evaluation and management service by telephone and the availability of in person appointments. I also discussed with the patient that there may be a patient responsible charge related to this service. The patient expressed understanding and agreed to proceed.  Reason for visit: same day visit  HPI: Respiratory illness: Onset of symptoms on 05/25/2020.  Tested positive for COVID on 05/25/2020.  Cough-coughing more starting today  Congestion-minimal   Sinus-minimal   Chest-feels tight though no congestion  Sore throat-yes  Shortness of breath-mild if she talks a lot  Fever-100 F on 1/10  Taste disturbance-no  Smell disturbance-no  Covid exposure-her daughter on 1/8  Covid vaccination-no  Covid booster-no  Medications-has been alternating Tylenol and Aleve, also using her albuterol inhaler, started prednisone yesterday    ROS: See pertinent positives and negatives per HPI.  Past Medical History:  Diagnosis Date  . Allergic rhinitis   . Chickenpox   . Chronic cholecystitis without calculus s/p lap cholecystectomy 02/04/2016 02/04/2016  . Diabetes ([place])    currently no meds- trying to control with diet  . Fatty liver    . Gastric polyps   . GERD (gastroesophageal reflux disease)   . Guaiac positive stools 08/03/2015  . Hyperlipemia   . Low iron   . Norovirus 07/2015  . Parathyroid abnormality ([place])   . Raynaud disease   . Tubular adenoma of colon   . UTI (lower urinary tract infection)     Past Surgical History:  Procedure Laterality Date  . BREAST CYST EXCISION    . LAPAROSCOPIC CHOLECYSTECTOMY SINGLE SITE WITH INTRAOPERATIVE CHOLANGIOGRAM N/A 02/04/2016   Procedure: LAPAROSCOPIC CHOLECYSTECTOMY SINGLE SITE WITH INTRAOPERATIVE CHOLANGIOGRAM AND STUDY OF BILE DUCTS;  Surgeon: Michael Boston, MD;  Location: [facility];  Service: General;  Laterality: N/A;  . PARATHYROIDECTOMY  2009  . TUBAL LIGATION  2008    Family History  Problem Relation Age of Onset  . Stroke Father   . Heart disease Maternal Grandfather   . Hypertension Paternal Aunt   . Diabetes Paternal Aunt   . Cancer Cousin        Peritoneal carcinoma  . Prostate cancer Other        great uncle  . Heart disease Paternal Grandmother   . Diverticulosis Mother   . GER disease Maternal Aunt   . Colon cancer Neg Hx     SOCIAL HX: Non-smoker   Current Outpatient Medications:  .  amLODipine (NORVASC) 10 MG tablet, TAKE 1 TABLET BY MOUTH ONCE DAILY, Disp: 90 tablet, Rfl: 3 .  Blood Glucose Monitoring Suppl (RELION CONFIRM GLUCOSE MONITOR) w/Device KIT, Check your blood glucose once daily before breakfast, Disp: 1 kit, Rfl: 0 .  cholecalciferol (VITAMIN D) 1000 units tablet, Take 5,000 Units by mouth daily., Disp: , Rfl:  .  CONTOUR NEXT TEST test strip, USE TO CHECK BLOOD SUGAR ONCE DAILY BEFORE BREAKFAST, Disp: 100 each, Rfl: 12 .  ferrous sulfate 325 (65 FE) MG tablet, Take 325 mg by mouth daily with breakfast., Disp: , Rfl:  .  fluticasone (FLONASE) 50 MCG/ACT nasal spray, USE 2 SPRAYS IN EACH NOSTRIL ONCE DAILY, Disp: 16 g, Rfl: 2 .  JARDIANCE 25 MG TABS tablet, TAKE 1 TABLET BY MOUTH ONCE A DAY, Disp: 90 tablet, Rfl: 0 .  Lancet  Devices (ONE TOUCH DELICA LANCING DEV) MISC, , Disp: , Rfl: 0 .  lisinopril (ZESTRIL) 5 MG tablet, TAKE 1 TABLET BY MOUTH ONCE DAILY, Disp: 30 tablet, Rfl: 1 .  loratadine (CLARITIN) 10 MG tablet, Take 10 mg by mouth daily., Disp: , Rfl:  .  metFORMIN (GLUCOPHAGE) 500 MG tablet, TAKE 2 TABLETS BY MOUTH TWICE DAILY WITHA MEAL, Disp: 360 tablet, Rfl: 3 .  MICROLET LANCETS MISC, CHECK YOUR BLOOD SUGAR ONCE DAILY BEFOREBREAKFAST, Disp: 100 each, Rfl: 1 .  omeprazole (PRILOSEC) 40 MG capsule, TAKE 1 CAPSULE BY MOUTH TWICE DAILY BEFORE A MEAL, Disp: 60 capsule, Rfl: 0 .  ondansetron (ZOFRAN ODT) 8 MG disintegrating tablet, Take 1 tablet (8 mg total) by mouth every 8 (eight) hours as needed for nausea or vomiting., Disp: 20 tablet, Rfl: 0 .  predniSONE (DELTASONE) 20 MG tablet, Take 2 tablets (40 mg total) by mouth daily with breakfast., Disp: 10 tablet, Rfl: 0 .  triamcinolone cream (KENALOG) 0.1 %, APPLY TOPICALLY 2 TIMES DAILY, Disp: 30 g, Rfl: 0 .  benzonatate (TESSALON) 100 MG capsule, Take 1 capsule (100 mg total) by mouth 3 (three) times daily as needed for cough., Disp: 21 capsule, Rfl: 0 .  Fluticasone-Salmeterol 55-14 MCG/ACT AEPB, Inhale 1 puff into the lungs in the morning and at bedtime., Disp: 1 each, Rfl: 2 .  PROAIR HFA 108 (90 Base) MCG/ACT inhaler, INHALE 2 PUFFS INTO THE LUNGS EVERY 6 HOURS AS NEEDED FOR WHEEZING OR SHORTNESS OF BREATH, Disp: 8.5 g, Rfl: 0  EXAM:  VITALS per patient if applicable:  GENERAL: alert, oriented, appears well and in no acute distress  HEENT: atraumatic, conjunttiva clear, no obvious abnormalities on inspection of external nose and ears  NECK: normal movements of the head and neck  LUNGS: on inspection no signs of respiratory distress, breathing rate appears normal, no obvious gross SOB, gasping or wheezing  CV: no obvious cyanosis  MS: moves all visible extremities without noticeable abnormality  PSYCH/NEURO: pleasant and cooperative, no obvious  depression or anxiety, speech and thought processing grossly intact  ASSESSMENT AND PLAN:  Discussed the following assessment and plan:  Problem List Items Addressed This Visit    COVID-19    Patient with COVID-19.  Discussed supportive care.  She is on prednisone currently given chest tightness and her history of asthma.  She can continue her albuterol inhaler every 6 hours on a schedule.  She can continue Tylenol.  Advised against NSAID use while on the prednisone.  We will refer for monoclonal antibody infusion though I did advise that she may not be high enough risk to receive this.  Discussed that if they reviewed her chart and felt she was not high enough risk they would not contact her.  Advised strict quarantine precautions.  Advised to seek medical attention for worsening symptoms, chest pain, shortness of breath, or fevers of 103 F or higher.      Relevant Orders   Ambulatory referral for Covid Treatment  I discussed the assessment and treatment plan with the patient. The patient was provided an opportunity to ask questions and all were answered. The patient agreed with the plan and demonstrated an understanding of the instructions.   The patient was advised to call back or seek an in-person evaluation if the symptoms worsen or if the condition fails to improve as anticipated.  Confirmed quarantines so this  [name], MD

## 2020-05-27 NOTE — Telephone Encounter (Signed)
Called and spoke to Nicole Bryant. [name] states that her fever has broke and today it is 98.1. Pt states that today she is not in any distress and that she feels tired. She states that she has already spoken to Gae Bon and is scheduled to be seen on 05/27/20 at 3:45 pm for a virtual visit.

## 2020-05-27 NOTE — Assessment & Plan Note (Addendum)
Patient with COVID-19.  Discussed supportive care.  She is on prednisone currently given chest tightness and her history of asthma.  She can continue her albuterol inhaler every 6 hours on a schedule.  She can continue Tylenol.  Advised against NSAID use while on the prednisone.  We will refer for monoclonal antibody infusion though I did advise that she may not be high enough risk to receive this.  Discussed that if they reviewed her chart and felt she was not high enough risk they would not contact her.  Advised strict quarantine precautions.  Advised to seek medical attention for worsening symptoms, chest pain, shortness of breath, or fevers of 103 F or higher.

## 2020-06-01 NOTE — Telephone Encounter (Signed)
Error. ng 

## 2020-06-02 ENCOUNTER — Encounter: Payer: Self-pay | Admitting: Family Medicine

## 2020-06-02 ENCOUNTER — Ambulatory Visit
Admission: EM | Admit: 2020-06-02 | Discharge: 2020-06-02 | Disposition: A | Discharge status: Home / Self Care | Payer: 59 | Attending: Family Medicine | Admitting: Family Medicine

## 2020-06-02 ENCOUNTER — Ambulatory Visit (INDEPENDENT_AMBULATORY_CARE_PROVIDER_SITE_OTHER): Payer: 59

## 2020-06-02 ENCOUNTER — Other Ambulatory Visit: Payer: Self-pay

## 2020-06-02 DIAGNOSIS — U071 COVID-19: Secondary | ICD-10-CM | POA: Diagnosis not present

## 2020-06-02 DIAGNOSIS — R059 Cough, unspecified: Secondary | ICD-10-CM

## 2020-06-02 NOTE — ED Provider Notes (Signed)
Nicole Bryant    CSN: [id number] Arrival date & time: 06/02/20  1423      History   Chief Complaint Chief Complaint  Patient presents with  . Covid Positive    05/25/2020    HPI Nicole Bryant is a 50 y.o. female.   Pt is a 50 year old female that presents with cough, burning in chest.  Tested positive for COVID on 1/10.  Reports still having a lot of fatigue and not improving much.  Continued cough with burning in her lungs.  Recently finished prednisone that was prescribed by her doctor and using albuterol without much relief.  No fevers, nausea.  No shortness of breath     Past Medical History:  Diagnosis Date  . Allergic rhinitis   . Chickenpox   . Chronic cholecystitis without calculus s/p lap cholecystectomy 02/04/2016 02/04/2016  . Diabetes ([place])    currently no meds- trying to control with diet  . Fatty liver   . Gastric polyps   . GERD (gastroesophageal reflux disease)   . Guaiac positive stools 08/03/2015  . Hyperlipemia   . Low iron   . Norovirus 07/2015  . Parathyroid abnormality ([place])   . Raynaud disease   . Tubular adenoma of colon   . UTI (lower urinary tract infection)     Patient Active Problem List   Diagnosis Date Noted  . COVID-19 05/27/2020  . Asthma 12/06/2019  . Cervical cancer screening 08/22/2019  . Obesity (BMI 30.0-34.9) 08/22/2019  . Missed period 05/02/2018  . Acute right ankle pain 10/03/2017  . Rash 10/03/2017  . Hypertension 06/26/2017  . Iron deficiency 06/26/2017  . Sleeping difficulty 12/29/2015  . Type 2 diabetes mellitus with hyperglycemia, without long-term current use of insulin ([place])   . Hyperlipidemia 08/03/2015    Past Surgical History:  Procedure Laterality Date  . BREAST CYST EXCISION    . LAPAROSCOPIC CHOLECYSTECTOMY SINGLE SITE WITH INTRAOPERATIVE CHOLANGIOGRAM N/A 02/04/2016   Procedure: LAPAROSCOPIC CHOLECYSTECTOMY SINGLE SITE WITH INTRAOPERATIVE CHOLANGIOGRAM AND STUDY OF BILE DUCTS;   Surgeon: Michael Boston, MD;  Location: [facility];  Service: General;  Laterality: N/A;  . PARATHYROIDECTOMY  2009  . TUBAL LIGATION  2008    OB History    Gravida  1   Para  1   Term  1   Preterm      AB      Living  1     SAB      IAB      Ectopic      Multiple      Live Births               Home Medications    Prior to Admission medications   Medication Sig Start Date End Date Taking? Authorizing Provider  albuterol (VENTOLIN HFA) 108 (90 Base) MCG/ACT inhaler INHALE 2 PUFFS INTO THE LUNGS EVERY 6 HOURS AS NEEDED FOR WHEEZING OR SHORTNESS OF BREATH 05/28/20  Yes Leone Haven, MD  amLODipine (NORVASC) 10 MG tablet TAKE 1 TABLET BY MOUTH ONCE DAILY 07/16/19  Yes Leone Haven, MD  benzonatate (TESSALON) 100 MG capsule TAKE 1 CAPSULE BY MOUTH 3 TIMES DAILY ASNEEDED FOR COUGH 05/28/20  Yes Leone Haven, MD  benzonatate (TESSALON) 100 MG capsule Take 1 capsule (100 mg total) by mouth 3 (three) times daily as needed for cough. 05/27/20  Yes Leone Haven, MD  Blood Glucose Monitoring Suppl ([place]) w/Device KIT Check your blood  glucose once daily before breakfast 08/03/15  Yes Leone Haven, MD  cholecalciferol (VITAMIN D) 1000 units tablet Take 5,000 Units by mouth daily.   Yes [provider]  CONTOUR NEXT TEST test strip USE TO CHECK BLOOD SUGAR ONCE DAILY BEFORE BREAKFAST 07/13/17  Yes Leone Haven, MD  FEROSUL 325 (65 Fe) MG tablet TAKE 1 TABLET BY MOUTH TWICE A DAY WITH MEAL. TAKE WITH VITAMIN CAPSULE 05/28/20  Yes Leone Haven, MD  fluticasone [facility]) 50 MCG/ACT nasal spray USE 2 SPRAYS IN EACH NOSTRIL ONCE DAILY 04/21/20  Yes Leone Haven, MD  JARDIANCE 25 MG TABS tablet TAKE 1 TABLET BY MOUTH ONCE A DAY 04/21/20  Yes Leone Haven, MD  Lancet Devices (ONE TOUCH DELICA LANCING DEV) [place]  08/03/15  Yes [provider]  lisinopril (ZESTRIL) 5 MG tablet TAKE 1 TABLET BY MOUTH ONCE DAILY  05/28/20  Yes Leone Haven, MD  loratadine (CLARITIN) 10 MG tablet Take 10 mg by mouth daily.   Yes [provider]  metFORMIN (GLUCOPHAGE) 500 MG tablet TAKE 2 TABLETS BY MOUTH TWICE DAILY WITHA MEAL 06/19/19  Yes Leone Haven, MD  MICROLET LANCETS MISC CHECK YOUR BLOOD SUGAR ONCE DAILY BEFOREBREAKFAST 07/13/17  Yes Leone Haven, MD  omeprazole (PRILOSEC) 40 MG capsule TAKE 1 CAPSULE BY MOUTH TWICE DAILY BEFORE A MEAL 03/20/20  Yes Leone Haven, MD  ondansetron (ZOFRAN ODT) 8 MG disintegrating tablet Take 1 tablet (8 mg total) by mouth every 8 (eight) hours as needed for nausea or vomiting. 04/04/19  Yes Copland, Frederico Hamman, MD  triamcinolone cream (KENALOG) 0.1 % APPLY TOPICALLY 2 TIMES DAILY 04/29/19  Yes Leone Haven, MD  Fluticasone-Salmeterol 55-14 MCG/ACT AEPB Inhale 1 puff into the lungs in the morning and at bedtime. 12/20/19 03/19/20  Leone Haven, MD    Family History Family History  Problem Relation Age of Onset  . Stroke Father   . Heart disease Maternal Grandfather   . Hypertension Paternal Aunt   . Diabetes Paternal Aunt   . Cancer Cousin        Peritoneal carcinoma  . Prostate cancer Other        great uncle  . Heart disease Paternal Grandmother   . Diverticulosis Mother   . GER disease Maternal Aunt   . Colon cancer Neg Hx     Social History Social History   Tobacco Use  . Smoking status: Never Smoker  . Smokeless tobacco: Never Used  Vaping Use  . Vaping Use: Never used  Substance Use Topics  . Alcohol use: No    Alcohol/week: 0.0 standard drinks  . Drug use: No     Allergies   Doxycycline, Codeine, Diphenoxylate-atropine, and Sulfa antibiotics   Review of Systems Review of Systems   Physical Exam Triage Vital Signs ED Triage Vitals  Enc Vitals Group     BP 06/02/20 1432 (!) 155/101     Pulse Rate 06/02/20 1432 (!) 113     Resp 06/02/20 1432 18     Temp 06/02/20 1432 98.9 F (37.2 C)     Temp Source  06/02/20 1432 Oral     SpO2 06/02/20 1432 98 %     Weight --      Height --      Head Circumference --      Peak Flow --      Pain Score 06/02/20 1448 0     Pain Loc --  Pain Edu? --      Excl. in [place]? --    No data found.  Updated Vital Signs BP (!) 155/101 (BP Location: Left Arm)   Pulse (!) 113   Temp 98.9 F (37.2 C) (Oral)   Resp 18   LMP 05/25/2020   SpO2 98%   Visual Acuity Right Eye Distance:   Left Eye Distance:   Bilateral Distance:    Right Eye Near:   Left Eye Near:    Bilateral Near:     Physical Exam Vitals and nursing note reviewed.  Constitutional:      General: She is not in acute distress.    Appearance: Normal appearance. She is ill-appearing. She is not toxic-appearing or diaphoretic.  HENT:     Head: Normocephalic.     Nose: Nose normal.     Mouth/Throat:     Pharynx: Oropharynx is clear.  Eyes:     Conjunctiva/sclera: Conjunctivae normal.  Cardiovascular:     Rate and Rhythm: Normal rate and regular rhythm.  Pulmonary:     Effort: Pulmonary effort is normal.     Breath sounds: Normal breath sounds.  Musculoskeletal:        General: Normal range of motion.     Cervical back: Normal range of motion.  Skin:    General: Skin is warm and dry.     Findings: No rash.  Neurological:     Mental Status: She is alert.  Psychiatric:        Mood and Affect: Mood normal.      UC Treatments / Results  Labs (all labs ordered are listed, but only abnormal results are displayed) Labs Reviewed - No data to display  EKG   Radiology DG Chest 2 View  Result Date: 06/02/2020 CLINICAL DATA:  Cough, burning in chest.  Diagnosed with COVID 1/10 EXAM: CHEST - 2 VIEW COMPARISON:  None. FINDINGS: The heart size and mediastinal contours are within normal limits. Both lungs are clear. No visible pleural effusions or pneumothorax. No acute osseous abnormality. IMPRESSION: No active cardiopulmonary disease. Electronically Signed   By: Margaretha Sheffield  MD   On: 06/02/2020 15:12    Procedures Procedures (including critical care time)  Medications Ordered in UC Medications - No data to display  Initial Impression / Assessment and Plan / UC Course  I have reviewed the triage vital signs and the nursing notes.  Pertinent labs & imaging results that were available during my care of the patient were reviewed by me and considered in my medical decision making (see chart for details).     Cough Nothing concerning on exam.  Patient is mildly ill-appearing and tachycardic. Lungs clear.  X-ray without any acute findings.  Most likely lingering inflammation from COVID-19. Continue to use the inhaler as needed.  Tylenol and ibuprofen as needed. Follow up as needed for continued or worsening symptoms  Final Clinical Impressions(s) / UC Diagnoses   Final diagnoses:  Cough     Discharge Instructions     No concerns on your x-ray today. This is most likely lingering inflammation from the COVID virus.  Unfortunately the symptoms can last for many weeks after having contracted the virus. You can keep using the inhaler as needed.  Ibuprofen or Tylenol as needed Follow up as needed for continued or worsening symptoms    ED Prescriptions    None     PDMP not reviewed this encounter.   Orvan July, NP 06/02/20 1548

## 2020-06-02 NOTE — Discharge Instructions (Addendum)
No concerns on your x-ray today. This is most likely lingering inflammation from the COVID virus.  Unfortunately the symptoms can last for many weeks after having contracted the virus. You can keep using the inhaler as needed.  Ibuprofen or Tylenol as needed Follow up as needed for continued or worsening symptoms

## 2020-06-02 NOTE — ED Triage Notes (Signed)
Pt tested positive for COVID 01/10.  States she is not improving.  Continued cough with burning in her lungs.

## 2020-06-05 ENCOUNTER — Encounter: Payer: Self-pay | Admitting: Family Medicine

## 2020-06-08 MED ORDER — AMOXICILLIN-POT CLAVULANATE 875-125 MG PO TABS: 1.0000 | ORAL_TABLET | Freq: Two times a day (BID) | ORAL | 0 refills | Status: DC

## 2020-06-08 MED ORDER — AMOXICILLIN-POT CLAVULANATE 875-125 MG PO TABS
1.0000 | ORAL_TABLET | Freq: Two times a day (BID) | ORAL | 0 refills | Status: DC
Start: 2020-06-08 — End: 2020-06-08

## 2020-06-08 NOTE — Addendum Note (Signed)
Addended by: Leone Haven on: 06/08/2020 04:46 PM   Modules accepted: Orders

## 2020-06-15 ENCOUNTER — Other Ambulatory Visit: Payer: Self-pay | Admitting: Family Medicine

## 2020-06-22 ENCOUNTER — Encounter: Payer: Self-pay | Admitting: Family Medicine

## 2020-06-25 ENCOUNTER — Other Ambulatory Visit: Payer: Self-pay | Admitting: Family Medicine

## 2020-07-14 ENCOUNTER — Other Ambulatory Visit: Payer: Self-pay | Admitting: Family Medicine

## 2020-07-23 ENCOUNTER — Other Ambulatory Visit: Payer: Self-pay | Admitting: Family Medicine

## 2020-09-24 ENCOUNTER — Other Ambulatory Visit: Payer: Self-pay | Admitting: Family Medicine

## 2020-09-30 ENCOUNTER — Encounter: Payer: Self-pay | Admitting: Internal Medicine

## 2020-10-01 ENCOUNTER — Other Ambulatory Visit: Payer: Self-pay | Admitting: Family Medicine

## 2020-10-16 ENCOUNTER — Other Ambulatory Visit: Payer: Self-pay

## 2020-10-16 ENCOUNTER — Ambulatory Visit
Admission: RE | Admit: 2020-10-16 | Discharge: 2020-10-16 | Disposition: A | Discharge status: Home / Self Care | Payer: 59 | Source: Ambulatory Visit | Attending: Emergency Medicine | Admitting: Emergency Medicine

## 2020-10-16 VITALS — BP 151/95 | HR 98 | Temp 98.5°F | Resp 17

## 2020-10-16 DIAGNOSIS — J4530 Mild persistent asthma, uncomplicated: Secondary | ICD-10-CM | POA: Insufficient documentation

## 2020-10-16 DIAGNOSIS — J069 Acute upper respiratory infection, unspecified: Secondary | ICD-10-CM | POA: Insufficient documentation

## 2020-10-16 DIAGNOSIS — I10 Essential (primary) hypertension: Secondary | ICD-10-CM | POA: Insufficient documentation

## 2020-10-16 LAB — POCT RAPID STREP A (OFFICE): Rapid Strep A Screen: NEGATIVE

## 2020-10-16 MED ORDER — PREDNISONE 10 MG PO TABS: 40.0000 mg | ORAL_TABLET | Freq: Every day | ORAL | 0 refills | Status: AC

## 2020-10-16 NOTE — ED Triage Notes (Signed)
Patient c/o sore throat, SOB, and chest congestion x 6 days.   Patient denies fever at home.   Patient endorses performing at home COVID-19 test with negative test results.   Patient endorses " my throat just feels like it wants to close up".   Patient endorses SOB upon exertion.   Patient denies LOC, Chest Pain, or Dizziness.   Patient has history of Asthma.   Patient endorses using rescue inhaler, Dayquil, and Sudafed.

## 2020-10-16 NOTE — Discharge Instructions (Addendum)
Take the prednisone as directed.  Use your albuterol inhaler.  Your rapid strep test is negative.  A throat culture is pending; we will call you if it is positive requiring treatment.    Your COVID and Influenza tests are pending.  You should self quarantine until the test results are back.    Please do not take over-the-counter medications that raise your blood pressure, such as Sudafed.  Instead, take over-the-counter medications that are made for people with hypertension, such as Coricidin HBP.  Your blood pressure is elevated today at 151/95.  Please have this rechecked by your primary care provider in 2-4 weeks.

## 2020-10-16 NOTE — ED Provider Notes (Signed)
Roderic Palau    CSN: [id number] Arrival date & time: 10/16/20  0827      History   Chief Complaint Chief Complaint  Patient presents with  . Sore Throat  . Shortness of Breath  . Chest Congestion    HPI Nicole Bryant is a 50 y.o. female.   Patient presents with 6-day history of sore throat, chest congestion, mild nonproductive cough, shortness of breath with exertion.  She denies fever, rash, chest pain, wheezing, vomiting, diarrhea, or other symptoms.  She has history of asthma and has been using her albuterol inhaler.  She has also been taking Sudafed and DayQuil.  Her medical history includes hypertension, diabetes, asthma.  She had COVID in January 2022.  The history is provided by the patient and medical records.    Past Medical History:  Diagnosis Date  . Allergic rhinitis   . Chickenpox   . Chronic cholecystitis without calculus s/p lap cholecystectomy 02/04/2016 02/04/2016  . Diabetes ([place])    currently no meds- trying to control with diet  . Fatty liver   . Gastric polyps   . GERD (gastroesophageal reflux disease)   . Guaiac positive stools 08/03/2015  . Hyperlipemia   . Low iron   . Norovirus 07/2015  . Parathyroid abnormality ([place])   . Raynaud disease   . Tubular adenoma of colon   . UTI (lower urinary tract infection)     Patient Active Problem List   Diagnosis Date Noted  . COVID-19 05/27/2020  . Asthma 12/06/2019  . Cervical cancer screening 08/22/2019  . Obesity (BMI 30.0-34.9) 08/22/2019  . Missed period 05/02/2018  . Acute right ankle pain 10/03/2017  . Rash 10/03/2017  . Hypertension 06/26/2017  . Iron deficiency 06/26/2017  . Sleeping difficulty 12/29/2015  . Type 2 diabetes mellitus with hyperglycemia, without long-term current use of insulin ([place])   . Hyperlipidemia 08/03/2015    Past Surgical History:  Procedure Laterality Date  . BREAST CYST EXCISION    . LAPAROSCOPIC CHOLECYSTECTOMY SINGLE SITE WITH INTRAOPERATIVE  CHOLANGIOGRAM N/A 02/04/2016   Procedure: LAPAROSCOPIC CHOLECYSTECTOMY SINGLE SITE WITH INTRAOPERATIVE CHOLANGIOGRAM AND STUDY OF BILE DUCTS;  Surgeon: Michael Boston, MD;  Location: [facility];  Service: General;  Laterality: N/A;  . PARATHYROIDECTOMY  2009  . TUBAL LIGATION  2008    OB History    Gravida  1   Para  1   Term  1   Preterm      AB      Living  1     SAB      IAB      Ectopic      Multiple      Live Births               Home Medications    Prior to Admission medications   Medication Sig Start Date End Date Taking? Authorizing Provider  albuterol (VENTOLIN HFA) 108 (90 Base) MCG/ACT inhaler INHALE 2 PUFFS INTO THE LUNGS EVERY 6 HOURS AS NEEDED FOR WHEEZING OR SHORTNESS OF BREATH 05/28/20  Yes Leone Haven, MD  amLODipine (NORVASC) 10 MG tablet TAKE 1 TABLET BY MOUTH ONCE DAILY 07/23/20  Yes Leone Haven, MD  cholecalciferol (VITAMIN D) 1000 units tablet Take 5,000 Units by mouth daily.   Yes [provider]  FEROSUL 325 (65 Fe) MG tablet TAKE 1 TABLET BY MOUTH TWICE A DAY WITH MEAL. TAKE WITH VITAMIN CAPSULE 10/01/20  Yes Leone Haven, MD  fluticasone [facility])  50 MCG/ACT nasal spray USE 2 SPRAYS IN EACH NOSTRIL ONCE DAILY 07/14/20  Yes Leone Haven, MD  JARDIANCE 25 MG TABS tablet TAKE 1 TABLET BY MOUTH ONCE A DAY 07/14/20  Yes Leone Haven, MD  lisinopril (ZESTRIL) 5 MG tablet TAKE 1 TABLET BY MOUTH ONCE DAILY 09/24/20  Yes Leone Haven, MD  loratadine (CLARITIN) 10 MG tablet Take 10 mg by mouth daily.   Yes [provider]  metFORMIN (GLUCOPHAGE) 500 MG tablet TAKE 2 TABLETS BY MOUTH TWICE DAILY WITHA MEAL 06/25/20  Yes Leone Haven, MD  omeprazole (PRILOSEC) 40 MG capsule TAKE 1 CAPSULE BY MOUTH TWICE DAILY BEFORE A MEAL 07/23/20  Yes Leone Haven, MD  predniSONE (DELTASONE) 10 MG tablet Take 4 tablets (40 mg total) by mouth daily for 5 days. 10/16/20 10/21/20 Yes Nicole Balloon, NP   amoxicillin-clavulanate (AUGMENTIN) 875-125 MG tablet Take 1 tablet by mouth 2 (two) times daily. 06/08/20   Leone Haven, MD  benzonatate (TESSALON) 100 MG capsule TAKE 1 CAPSULE BY MOUTH 3 TIMES DAILY ASNEEDED FOR COUGH 05/28/20   Leone Haven, MD  benzonatate (TESSALON) 100 MG capsule Take 1 capsule (100 mg total) by mouth 3 (three) times daily as needed for cough. 05/27/20   Leone Haven, MD  Blood Glucose Monitoring Suppl ([place]) w/Device KIT Check your blood glucose once daily before breakfast 08/03/15   Leone Haven, MD  CONTOUR NEXT TEST test strip USE TO CHECK BLOOD SUGAR ONCE DAILY BEFORE BREAKFAST 07/13/17   Leone Haven, MD  Fluticasone-Salmeterol 55-14 MCG/ACT AEPB Inhale 1 puff into the lungs in the morning and at bedtime. 12/20/19 03/19/20  Leone Haven, MD  Lancet Devices (ONE TOUCH DELICA LANCING DEV) [place]  08/03/15   [provider]  MICROLET LANCETS MISC CHECK YOUR BLOOD SUGAR ONCE DAILY BEFOREBREAKFAST 07/13/17   Leone Haven, MD  ondansetron (ZOFRAN ODT) 8 MG disintegrating tablet Take 1 tablet (8 mg total) by mouth every 8 (eight) hours as needed for nausea or vomiting. 04/04/19   Copland, Frederico Hamman, MD  triamcinolone cream (KENALOG) 0.1 % APPLY TOPICALLY 2 TIMES DAILY 04/29/19   Leone Haven, MD    Family History Family History  Problem Relation Age of Onset  . Stroke Father   . Heart disease Maternal Grandfather   . Hypertension Paternal Aunt   . Diabetes Paternal Aunt   . Cancer Cousin        Peritoneal carcinoma  . Prostate cancer Other        great uncle  . Heart disease Paternal Grandmother   . Diverticulosis Mother   . GER disease Maternal Aunt   . Colon cancer Neg Hx     Social History Social History   Tobacco Use  . Smoking status: Never Smoker  . Smokeless tobacco: Never Used  Vaping Use  . Vaping Use: Never used  Substance Use Topics  . Alcohol use: No    Alcohol/week: 0.0  standard drinks  . Drug use: No     Allergies   Doxycycline, Codeine, Diphenoxylate-atropine, and Sulfa antibiotics   Review of Systems Review of Systems  Constitutional: Negative for chills and fever.  HENT: Positive for congestion and sore throat. Negative for ear pain.   Respiratory: Positive for shortness of breath. Negative for cough.   Cardiovascular: Negative for chest pain and palpitations.  Gastrointestinal: Negative for abdominal pain, diarrhea and vomiting.  Skin: Negative for color change and  rash.  All other systems reviewed and are negative.    Physical Exam Triage Vital Signs ED Triage Vitals  Enc Vitals Group     BP      Pulse      Resp      Temp      Temp src      SpO2      Weight      Height      Head Circumference      Peak Flow      Pain Score      Pain Loc      Pain Edu?      Excl. in [place]?    No data found.  Updated Vital Signs BP (!) 151/95 (BP Location: Left Arm)   Pulse 98   Temp 98.5 F (36.9 C) (Oral)   Resp 17   SpO2 98%   Visual Acuity Right Eye Distance:   Left Eye Distance:   Bilateral Distance:    Right Eye Near:   Left Eye Near:    Bilateral Near:     Physical Exam Vitals and nursing note reviewed.  Constitutional:      General: She is not in acute distress.    Appearance: She is well-developed. She is not ill-appearing.  HENT:     Head: Normocephalic and atraumatic.     Right Ear: Tympanic membrane normal.     Left Ear: Tympanic membrane normal.     Nose: Nose normal.     Mouth/Throat:     Mouth: Mucous membranes are moist.     Pharynx: Oropharynx is clear.  Eyes:     Conjunctiva/sclera: Conjunctivae normal.  Cardiovascular:     Rate and Rhythm: Normal rate and regular rhythm.     Heart sounds: Normal heart sounds.  Pulmonary:     Effort: Pulmonary effort is normal. No respiratory distress.     Breath sounds: Normal breath sounds.     Comments: Good air movement.  No wheezing. Abdominal:     Palpations:  Abdomen is soft.     Tenderness: There is no abdominal tenderness.  Musculoskeletal:     Cervical back: Neck supple.  Skin:    General: Skin is warm and dry.  Neurological:     General: No focal deficit present.     Mental Status: She is alert and oriented to person, place, and time.     Gait: Gait normal.  Psychiatric:        Mood and Affect: Mood normal.        Behavior: Behavior normal.      UC Treatments / Results  Labs (all labs ordered are listed, but only abnormal results are displayed) Labs Reviewed  CULTURE, GROUP A STREP ([place])  COVID-19, FLU A+B NAA  POCT RAPID STREP A (OFFICE)    EKG   Radiology No results found.  Procedures Procedures (including critical care time)  Medications Ordered in UC Medications - No data to display  Initial Impression / Assessment and Plan / UC Course  I have reviewed the triage vital signs and the nursing notes.  Pertinent labs & imaging results that were available during my care of the patient were reviewed by me and considered in my medical decision making (see chart for details).   Viral URI, mild persistent asthma.  Elevated blood pressure reading with known hypertension.  Patient is moving good air, no wheezing, O2 sat 98%.  Because she is feeling mildly short of breath with exertion,  treating with prednisone burst and continued use of her albuterol inhaler.  Rapid strep negative; culture pending.  Influenza and COVID pending.  Instructed patient to self quarantine per CDC guidelines.  Discussed symptomatic treatment and cautioned patient to avoid medications that elevate her blood pressure.  Instructed patient to follow up with PCP if her symptoms are not improving.  Discussed that her blood pressure is elevated today and needs to be rechecked by her PCP in 2 to 4 weeks.  Patient agrees to plan of care.   Final Clinical Impressions(s) / UC Diagnoses   Final diagnoses:  Viral URI  Elevated blood pressure reading in office  with diagnosis of hypertension  Mild persistent asthma, unspecified whether complicated     Discharge Instructions     Take the prednisone as directed.  Use your albuterol inhaler.  Your rapid strep test is negative.  A throat culture is pending; we will call you if it is positive requiring treatment.    Your COVID and Influenza tests are pending.  You should self quarantine until the test results are back.    Please do not take over-the-counter medications that raise your blood pressure, such as Sudafed.  Instead, take over-the-counter medications that are made for people with hypertension, such as Coricidin HBP.  Your blood pressure is elevated today at 151/95.  Please have this rechecked by your primary care provider in 2-4 weeks.           ED Prescriptions    Medication Sig Dispense Auth. Provider   predniSONE (DELTASONE) 10 MG tablet Take 4 tablets (40 mg total) by mouth daily for 5 days. 20 tablet Nicole Balloon, NP     PDMP not reviewed this encounter.   Nicole Balloon, NP 10/16/20 [phone]

## 2020-10-18 LAB — CULTURE, GROUP A STREP (THRC)

## 2020-10-18 LAB — COVID-19, FLU A+B NAA
Influenza A, NAA: NOT DETECTED
Influenza B, NAA: NOT DETECTED
SARS-CoV-2, NAA: NOT DETECTED

## 2020-10-21 ENCOUNTER — Encounter: Payer: Self-pay | Admitting: Family Medicine

## 2020-10-21 NOTE — Telephone Encounter (Signed)
She needs an appointment for evaluation to determine if an antibiotic is appropriate. This will need to be virtual. Please call her and get her scheduled with any available provider. Thanks.

## 2020-10-23 ENCOUNTER — Telehealth: Payer: Self-pay

## 2020-10-23 NOTE — Telephone Encounter (Signed)
I called the patient and stated that the provider felt she needed to be seen again since she stated the antibiotics she was on was finished and did not help. Patient stated she will wait and see if she is better after the weekend and if not she will call and make an appointment.  Josias Tomerlin,cma

## 2020-10-28 ENCOUNTER — Other Ambulatory Visit: Payer: Self-pay | Admitting: Family Medicine

## 2020-11-23 ENCOUNTER — Other Ambulatory Visit: Payer: Self-pay | Admitting: Family Medicine

## 2020-11-30 ENCOUNTER — Other Ambulatory Visit: Payer: Self-pay | Admitting: Family Medicine

## 2020-12-23 ENCOUNTER — Other Ambulatory Visit: Payer: Self-pay | Admitting: Family Medicine

## 2020-12-28 ENCOUNTER — Other Ambulatory Visit: Payer: Self-pay

## 2020-12-28 ENCOUNTER — Ambulatory Visit (AMBULATORY_SURGERY_CENTER): Payer: 59 | Admitting: *Deleted

## 2020-12-28 VITALS — Ht 65.0 in | Wt 194.0 lb

## 2020-12-28 DIAGNOSIS — Z86010 Personal history of colonic polyps: Secondary | ICD-10-CM

## 2020-12-28 MED ORDER — NA SULFATE-K SULFATE-MG SULF 17.5-3.13-1.6 GM/177ML PO SOLN: 1.0000 | Freq: Once | ORAL | 0 refills | Status: AC

## 2020-12-28 NOTE — Progress Notes (Signed)
No egg or soy allergy known to patient  No issues with past sedation with any surgeries or procedures Patient denies ever being told they had issues or difficulty with intubation  No FH of Malignant Hyperthermia No diet pills per patient No home 02 use per patient  No blood thinners per patient  Pt denies issues with constipation  No A fib or A flutter  EMMI video to pt or via [place] 19 guidelines implemented in PV today with Pt and RN  Pt is NOT  vaccinated  for Covid   Due to the COVID-19 pandemic we are asking patients to follow certain guidelines.  Pt aware of COVID protocols and [facility] guidelines   Pt verified name, DOB, address and insurance during PV today. Pt mailed instruction packet to included paper to complete and mail back to [facility] with addressed and stamped envelope, Emmi video, copy of consent form to read and not return, and instructions Pt encouraged to call with questions or issues.  My Chart instructions to pt as well

## 2021-01-08 ENCOUNTER — Other Ambulatory Visit: Payer: Self-pay | Admitting: Family Medicine

## 2021-01-11 ENCOUNTER — Ambulatory Visit (AMBULATORY_SURGERY_CENTER): Payer: 59 | Admitting: Internal Medicine

## 2021-01-11 ENCOUNTER — Other Ambulatory Visit: Payer: Self-pay | Admitting: Internal Medicine

## 2021-01-11 ENCOUNTER — Encounter: Payer: Self-pay | Admitting: Internal Medicine

## 2021-01-11 ENCOUNTER — Other Ambulatory Visit: Payer: Self-pay

## 2021-01-11 VITALS — BP 117/78 | HR 64 | Temp 98.0°F | Resp 16 | Ht 65.0 in | Wt 195.0 lb

## 2021-01-11 DIAGNOSIS — D124 Benign neoplasm of descending colon: Secondary | ICD-10-CM

## 2021-01-11 DIAGNOSIS — Z86010 Personal history of colonic polyps: Secondary | ICD-10-CM

## 2021-01-11 MED ORDER — SODIUM CHLORIDE 0.9 % IV SOLN: 500.0000 mL | Freq: Once | INTRAVENOUS | Status: DC

## 2021-01-11 NOTE — Progress Notes (Signed)
C.W. vital signs. 

## 2021-01-11 NOTE — Progress Notes (Signed)
Called to room to assist during endoscopic procedure.  Patient ID and intended procedure confirmed with present staff. Received instructions for my participation in the procedure from the performing physician.  

## 2021-01-11 NOTE — Op Note (Signed)
[facility] Patient Name: Nicole Bryant Procedure Date: 01/11/2021 9:48 AM MRN: [id number] Endoscopist: Jerene Bears , MD Age: 50 Referring MD:  Date of Birth: 1970-08-24 Gender: Female Account #: [id number] Procedure:                Colonoscopy Indications:              Surveillance: Personal history of adenomatous                            polyps on last colonoscopy 5 years ago, Last                            colonoscopy: May 2017 Medicines:                Monitored Anesthesia Care Procedure:                Pre-Anesthesia Assessment:                           - Prior to the procedure, a History and Physical                            was performed, and patient medications and                            allergies were reviewed. The patient's tolerance of                            previous anesthesia was also reviewed. The risks                            and benefits of the procedure and the sedation                            options and risks were discussed with the patient.                            All questions were answered, and informed consent                            was obtained. Prior Anticoagulants: The patient has                            taken no previous anticoagulant or antiplatelet                            agents. ASA Grade Assessment: III - A patient with                            severe systemic disease. After reviewing the risks                            and benefits, the patient was deemed in  satisfactory condition to undergo the procedure.                           After obtaining informed consent, the colonoscope                            was passed under direct vision. Throughout the                            procedure, the patient's blood pressure, pulse, and                            oxygen saturations were monitored continuously. The                            Olympus PCF-H190DL [id number])  Colonoscope was                            introduced through the anus and advanced to the                            cecum, identified by appendiceal orifice and                            ileocecal valve. The colonoscopy was performed                            without difficulty. The patient tolerated the                            procedure well. The quality of the bowel                            preparation was good. The ileocecal valve,                            appendiceal orifice, and rectum were photographed. Scope In: 9:59:19 AM Scope Out: 10:17:04 AM Scope Withdrawal Time: 0 hours 14 minutes 49 seconds  Total Procedure Duration: 0 hours 17 minutes 45 seconds  Findings:                 The digital rectal exam was normal.                           A 5 mm polyp was found in the proximal descending                            colon. The polyp was sessile. The polyp was removed                            with a cold snare. Resection and retrieval were                            complete.  A few small-mouthed diverticula were found in the                            sigmoid colon.                           The retroflexed view of the distal rectum and anal                            verge was normal and showed no anal or rectal                            abnormalities. Complications:            No immediate complications. Estimated Blood Loss:     Estimated blood loss was minimal. Impression:               - One 5 mm polyp in the proximal descending colon,                            removed with a cold snare. Resected and retrieved.                           - Diverticulosis in the sigmoid colon.                           - The distal rectum and anal verge are normal on                            retroflexion view. Recommendation:           - Patient has a contact number available for                            emergencies. The signs and symptoms of  potential                            delayed complications were discussed with the                            patient. Return to normal activities tomorrow.                            Written discharge instructions were provided to the                            patient.                           - Resume previous diet.                           - Continue present medications.                           - Await pathology results.                           -  Repeat colonoscopy is recommended for                            surveillance. The colonoscopy date will be                            determined after pathology results from today's                            exam become available for review. Jerene Bears, MD 01/11/2021 10:19:49 AM This report has been signed electronically.

## 2021-01-11 NOTE — Progress Notes (Signed)
To PACU, VSS. Report to [email or web address] 

## 2021-01-11 NOTE — Patient Instructions (Signed)
Please read handouts provided. Continue present medications. Await pathology results.   YOU HAD AN ENDOSCOPIC PROCEDURE TODAY AT THE [facility]:   Refer to the procedure report that was given to you for any specific questions about what was found during the examination.  If the procedure report does not answer your questions, please call your gastroenterologist to clarify.  If you requested that your care partner not be given the details of your procedure findings, then the procedure report has been included in a sealed envelope for you to review at your convenience later.  YOU SHOULD EXPECT: Some feelings of bloating in the abdomen. Passage of more gas than usual.  Walking can help get rid of the air that was put into your GI tract during the procedure and reduce the bloating. If you had a lower endoscopy (such as a colonoscopy or flexible sigmoidoscopy) you may notice spotting of blood in your stool or on the toilet paper. If you underwent a bowel prep for your procedure, you may not have a normal bowel movement for a few days.  Please Note:  You might notice some irritation and congestion in your nose or some drainage.  This is from the oxygen used during your procedure.  There is no need for concern and it should clear up in a day or so.  SYMPTOMS TO REPORT IMMEDIATELY:  Following lower endoscopy (colonoscopy or flexible sigmoidoscopy):  Excessive amounts of blood in the stool  Significant tenderness or worsening of abdominal pains  Swelling of the abdomen that is new, acute  Fever of 100F or higher   For urgent or emergent issues, a gastroenterologist can be reached at any hour by calling [phone]. Do not use MyChart messaging for urgent concerns.    DIET:  We do recommend a small meal at first, but then you may proceed to your regular diet.  Drink plenty of fluids but you should avoid alcoholic beverages for 24 hours.  ACTIVITY:  You should plan to take it easy  for the rest of today and you should NOT DRIVE or use heavy machinery until tomorrow (because of the sedation medicines used during the test).    FOLLOW UP: Our staff will call the number listed on your records 48-72 hours following your procedure to check on you and address any questions or concerns that you may have regarding the information given to you following your procedure. If we do not reach you, we will leave a message.  We will attempt to reach you two times.  During this call, we will ask if you have developed any symptoms of COVID 19. If you develop any symptoms (ie: fever, flu-like symptoms, shortness of breath, cough etc.) before then, please call [phone].  If you test positive for Covid 19 in the 2 weeks post procedure, please call and report this information to us.    If any biopsies were taken you will be contacted by phone or by letter within the next 1-3 weeks.  Please call us at [phone] if you have not heard about the biopsies in 3 weeks.    SIGNATURES/CONFIDENTIALITY: You and/or your care partner have signed paperwork which will be entered into your electronic medical record.  These signatures attest to the fact that that the information above on your After Visit Summary has been reviewed and is understood.  Full responsibility of the confidentiality of this discharge information lies with you and/or your care-partner.  

## 2021-01-11 NOTE — Progress Notes (Signed)
GASTROENTEROLOGY PROCEDURE H&P NOTE   Primary Care Physician: Leone Haven, MD    Reason for Procedure:  History of adenomatous colon polyps  Plan:    Colonoscopy  Patient is appropriate for endoscopic procedure(s) in the ambulatory ([facility]) setting.  The nature of the procedure, as well as the risks, benefits, and alternatives were carefully and thoroughly reviewed with the patient. Ample time for discussion and questions allowed. The patient understood, was satisfied, and agreed to proceed.     HPI: Nicole Bryant is a 50 y.o. female who presents for surveillance colonoscopy.  No complaints today including no recent chest pain or shortness of breath.  Tolerated the prep.  Medical history as below.  Past Medical History:  Diagnosis Date   Allergic rhinitis    Allergy    Anemia    Asthma    Chickenpox    Chronic cholecystitis without calculus s/p lap cholecystectomy 02/04/2016 02/04/2016   COVID-19 virus infection    Diabetes ([place])    currently no meds- trying to control with diet   Fatty liver    Gastric polyps    GERD (gastroesophageal reflux disease)    Guaiac positive stools 08/03/2015   Hyperlipemia    Hypertension    Low iron    Norovirus 07/2015   Parathyroid abnormality ([facility])    Raynaud disease    Tubular adenoma of colon    UTI (lower urinary tract infection)     Past Surgical History:  Procedure Laterality Date   BREAST CYST EXCISION     CHOLECYSTECTOMY     COLONOSCOPY     LAPAROSCOPIC CHOLECYSTECTOMY SINGLE SITE WITH INTRAOPERATIVE CHOLANGIOGRAM N/A 02/04/2016   Procedure: LAPAROSCOPIC CHOLECYSTECTOMY SINGLE SITE WITH INTRAOPERATIVE CHOLANGIOGRAM AND STUDY OF BILE DUCTS;  Surgeon: Michael Boston, MD;  Location: [facility];  Service: General;  Laterality: N/A;   PARATHYROIDECTOMY  05/17/2007   POLYPECTOMY     TUBAL LIGATION  05/16/2006    Prior to Admission medications   Medication Sig Start Date End Date Taking? Authorizing Provider   amLODipine (NORVASC) 10 MG tablet TAKE 1 TABLET BY MOUTH ONCE DAILY 07/23/20  Yes Leone Haven, MD  Blood Glucose Monitoring Suppl ([place]) w/Device KIT Check your blood glucose once daily before breakfast 08/03/15  Yes Leone Haven, MD  cholecalciferol (VITAMIN D) 1000 units tablet Take 5,000 Units by mouth daily.   Yes [provider]  CONTOUR NEXT TEST test strip USE TO CHECK BLOOD SUGAR ONCE DAILY BEFORE BREAKFAST 07/13/17  Yes Leone Haven, MD  fluticasone [facility]) 50 MCG/ACT nasal spray USE 2 SPRAYS IN EACH NOSTRIL ONCE DAILY 12/24/20  Yes Leone Haven, MD  JARDIANCE 25 MG TABS tablet TAKE 1 TABLET BY MOUTH ONCE A DAY 10/28/20  Yes Leone Haven, MD  Lancet Devices (ONE TOUCH DELICA LANCING DEV) [place]  08/03/15  Yes [provider]  lisinopril (ZESTRIL) 5 MG tablet TAKE 1 TABLET BY MOUTH ONCE DAILY 11/23/20  Yes Leone Haven, MD  loratadine (CLARITIN) 10 MG tablet Take 10 mg by mouth daily.   Yes [provider]  metFORMIN (GLUCOPHAGE) 500 MG tablet TAKE 2 TABLETS BY MOUTH TWICE DAILY WITHA MEAL 06/25/20  Yes Leone Haven, MD  MICROLET LANCETS MISC CHECK YOUR BLOOD SUGAR ONCE DAILY BEFOREBREAKFAST 07/13/17  Yes Leone Haven, MD  omeprazole (PRILOSEC) 40 MG capsule TAKE 1 CAPSULE BY MOUTH TWICE DAILY BEFORE A MEAL 07/23/20  Yes Leone Haven, MD  albuterol (  VENTOLIN HFA) 108 (90 Base) MCG/ACT inhaler INHALE 2 PUFFS INTO THE LUNGS EVERY 6 HOURS AS NEEDED FOR WHEEZING OR SHORTNESS OF BREATH 05/28/20   Leone Haven, MD  FEROSUL 325 (65 Fe) MG tablet TAKE 1 TABLET BY MOUTH TWICE A DAY WITH MEAL. TAKE WITH VITAMIN CAPSULE 11/30/20   Leone Haven, MD  ondansetron (ZOFRAN ODT) 8 MG disintegrating tablet Take 1 tablet (8 mg total) by mouth every 8 (eight) hours as needed for nausea or vomiting. Patient not taking: No sig reported 04/04/19   [place], Frederico Hamman, MD  triamcinolone cream (KENALOG) 0.1 %  APPLY TOPICALLY 2 TIMES DAILY 04/29/19   Leone Haven, MD    Current Outpatient Medications  Medication Sig Dispense Refill   amLODipine (NORVASC) 10 MG tablet TAKE 1 TABLET BY MOUTH ONCE DAILY 90 tablet 3   Blood Glucose Monitoring Suppl (RELION CONFIRM GLUCOSE MONITOR) w/Device KIT Check your blood glucose once daily before breakfast 1 kit 0   cholecalciferol (VITAMIN D) 1000 units tablet Take 5,000 Units by mouth daily.     CONTOUR NEXT TEST test strip USE TO CHECK BLOOD SUGAR ONCE DAILY BEFORE BREAKFAST 100 each 12   fluticasone (FLONASE) 50 MCG/ACT nasal spray USE 2 SPRAYS IN EACH NOSTRIL ONCE DAILY 16 g 2   JARDIANCE 25 MG TABS tablet TAKE 1 TABLET BY MOUTH ONCE A DAY 90 tablet 0   Lancet Devices (ONE TOUCH DELICA LANCING DEV) MISC   0   lisinopril (ZESTRIL) 5 MG tablet TAKE 1 TABLET BY MOUTH ONCE DAILY 30 tablet 1   loratadine (CLARITIN) 10 MG tablet Take 10 mg by mouth daily.     metFORMIN (GLUCOPHAGE) 500 MG tablet TAKE 2 TABLETS BY MOUTH TWICE DAILY WITHA MEAL 360 tablet 3   MICROLET LANCETS MISC CHECK YOUR BLOOD SUGAR ONCE DAILY BEFOREBREAKFAST 100 each 1   omeprazole (PRILOSEC) 40 MG capsule TAKE 1 CAPSULE BY MOUTH TWICE DAILY BEFORE A MEAL 60 capsule 3   albuterol (VENTOLIN HFA) 108 (90 Base) MCG/ACT inhaler INHALE 2 PUFFS INTO THE LUNGS EVERY 6 HOURS AS NEEDED FOR WHEEZING OR SHORTNESS OF BREATH 8.5 g 0   FEROSUL 325 (65 Fe) MG tablet TAKE 1 TABLET BY MOUTH TWICE A DAY WITH MEAL. TAKE WITH VITAMIN CAPSULE 30 tablet 1   ondansetron (ZOFRAN ODT) 8 MG disintegrating tablet Take 1 tablet (8 mg total) by mouth every 8 (eight) hours as needed for nausea or vomiting. (Patient not taking: No sig reported) 20 tablet 0   triamcinolone cream (KENALOG) 0.1 % APPLY TOPICALLY 2 TIMES DAILY 30 g 0   Current Facility-Administered Medications  Medication Dose Route Frequency Provider Last Rate Last Admin   0.9 %  sodium chloride infusion  500 mL Intravenous Once Geremy Rister, Lajuan Lines, MD         Allergies as of 01/11/2021 - Review Complete 01/11/2021  Allergen Reaction Noted   Doxycycline Nausea And Vomiting and Other (See Comments) 06/14/2019   Codeine Itching 08/10/2015   Diphenoxylate-atropine Anxiety, Itching, and Other (See Comments) 08/22/2019   Sulfa antibiotics Nausea And Vomiting 07/07/2015    Family History  Problem Relation Age of Onset   Diverticulosis Mother    Stroke Father    Colon polyps Maternal Aunt    GER disease Maternal Aunt    Hypertension Paternal Aunt    Diabetes Paternal Aunt    Heart disease Maternal Grandfather    Heart disease Paternal Grandmother    Cancer Cousin  Peritoneal carcinoma   Prostate cancer Other        great uncle   Colon cancer Neg Hx    Esophageal cancer Neg Hx    Stomach cancer Neg Hx    Rectal cancer Neg Hx     Social History   Socioeconomic History   Marital status: Single    Spouse name: Not on file   Number of children: Not on file   Years of education: Not on file   Highest education level: Not on file  Occupational History   Occupation: data entry  Tobacco Use   Smoking status: Never   Smokeless tobacco: Never  Vaping Use   Vaping Use: Never used  Substance and Sexual Activity   Alcohol use: No    Alcohol/week: 0.0 standard drinks   Drug use: No   Sexual activity: Not on file  Other Topics Concern   Not on file  Social History Narrative   Not on file   Social Determinants of Health   Financial Resource Strain: Not on file  Food Insecurity: Not on file  Transportation Needs: Not on file  Physical Activity: Not on file  Stress: Not on file  Social Connections: Not on file  Intimate Partner Violence: Not on file    Physical Exam: Vital signs in last 24 hours: _0  (!) 122/100   Pulse 79   Temp 98 F (36.7 C)   Ht _1  (1.651 m)   Wt 195 lb (88.5 kg)   SpO2 99%   BMI 32.45 kg/m  GEN: NAD EYE: Sclerae anicteric ENT: MMM CV: Non-tachycardic Pulm: CTA b/l GI: Soft,  NT/ND NEURO:  Alert & Oriented x 3   Zenovia Jarred, MD [facility]  01/11/2021 9:48 AM

## 2021-01-11 NOTE — Progress Notes (Signed)
Pt's states no medical or surgical changes since previsit or office visit. 

## 2021-01-13 ENCOUNTER — Telehealth: Payer: Self-pay

## 2021-01-13 NOTE — Telephone Encounter (Signed)
  Follow up Call-  Call back number 01/11/2021  Post procedure Call Back phone  # [phone]  Permission to leave phone message Yes  Some recent data might be hidden     Patient questions:  Do you have a fever, pain , or abdominal swelling? No. Pain Score  0 *  Have you tolerated food without any problems? Yes.    Have you been able to return to your normal activities? Yes.    Do you have any questions about your discharge instructions: Diet   No. Medications  No. Follow up visit  No.  Do you have questions or concerns about your Care? No.  Actions: * If pain score is 4 or above: No action needed, pain <4. Have you developed a fever since your procedure? No   2.   Have you had an respiratory symptoms (SOB or cough) since your procedure? no  3.   Have you tested positive for COVID 19 since your procedure no  4.   Have you had any family members/close contacts diagnosed with the COVID 19 since your procedure?  no   If yes to any of these questions please route to Joylene John, RN and Joella Prince, RN

## 2021-01-14 ENCOUNTER — Encounter: Payer: Self-pay | Admitting: Internal Medicine

## 2021-02-01 ENCOUNTER — Other Ambulatory Visit: Payer: Self-pay | Admitting: Family Medicine

## 2021-02-06 IMAGING — DX DG CHEST 2V
2 series · 2 of 2 positions shown · non-contrast
Comparison: None.

CLINICAL DATA: Cough, burning in chest.  Diagnosed with COVID [DATE]

EXAM:
CHEST - 2 VIEW

[chest pa]
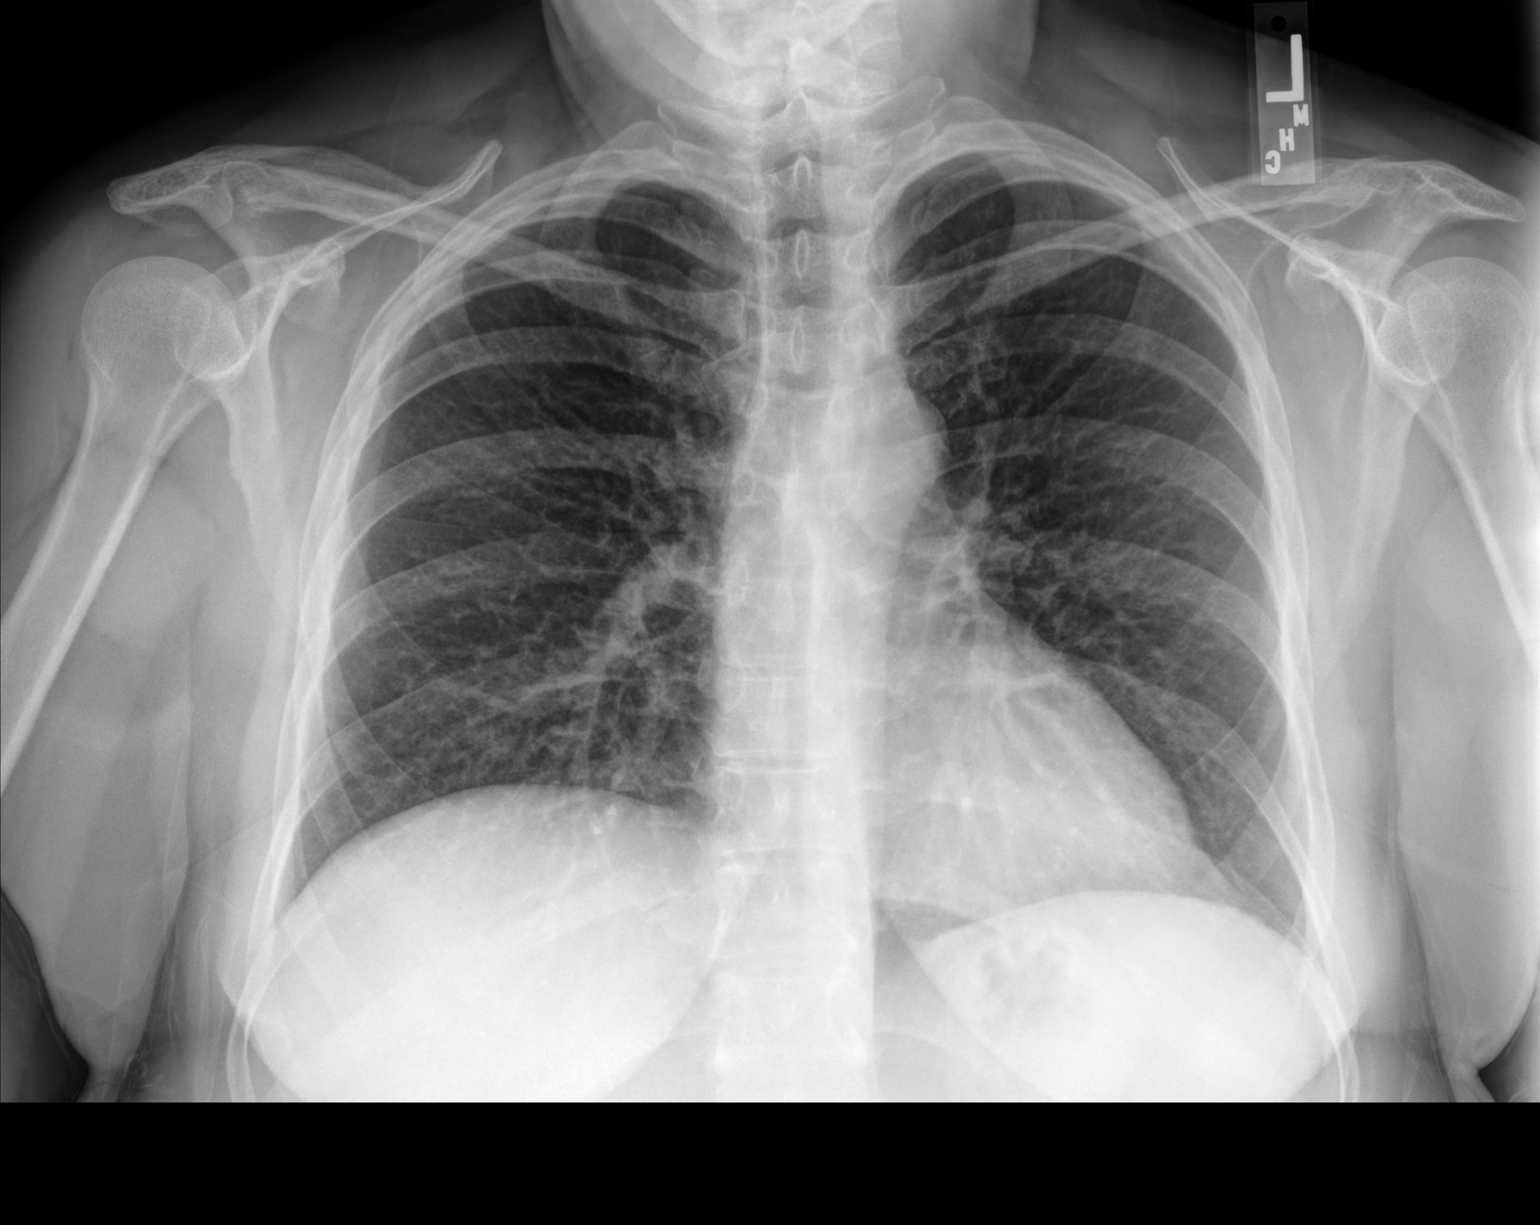

[chest lat]
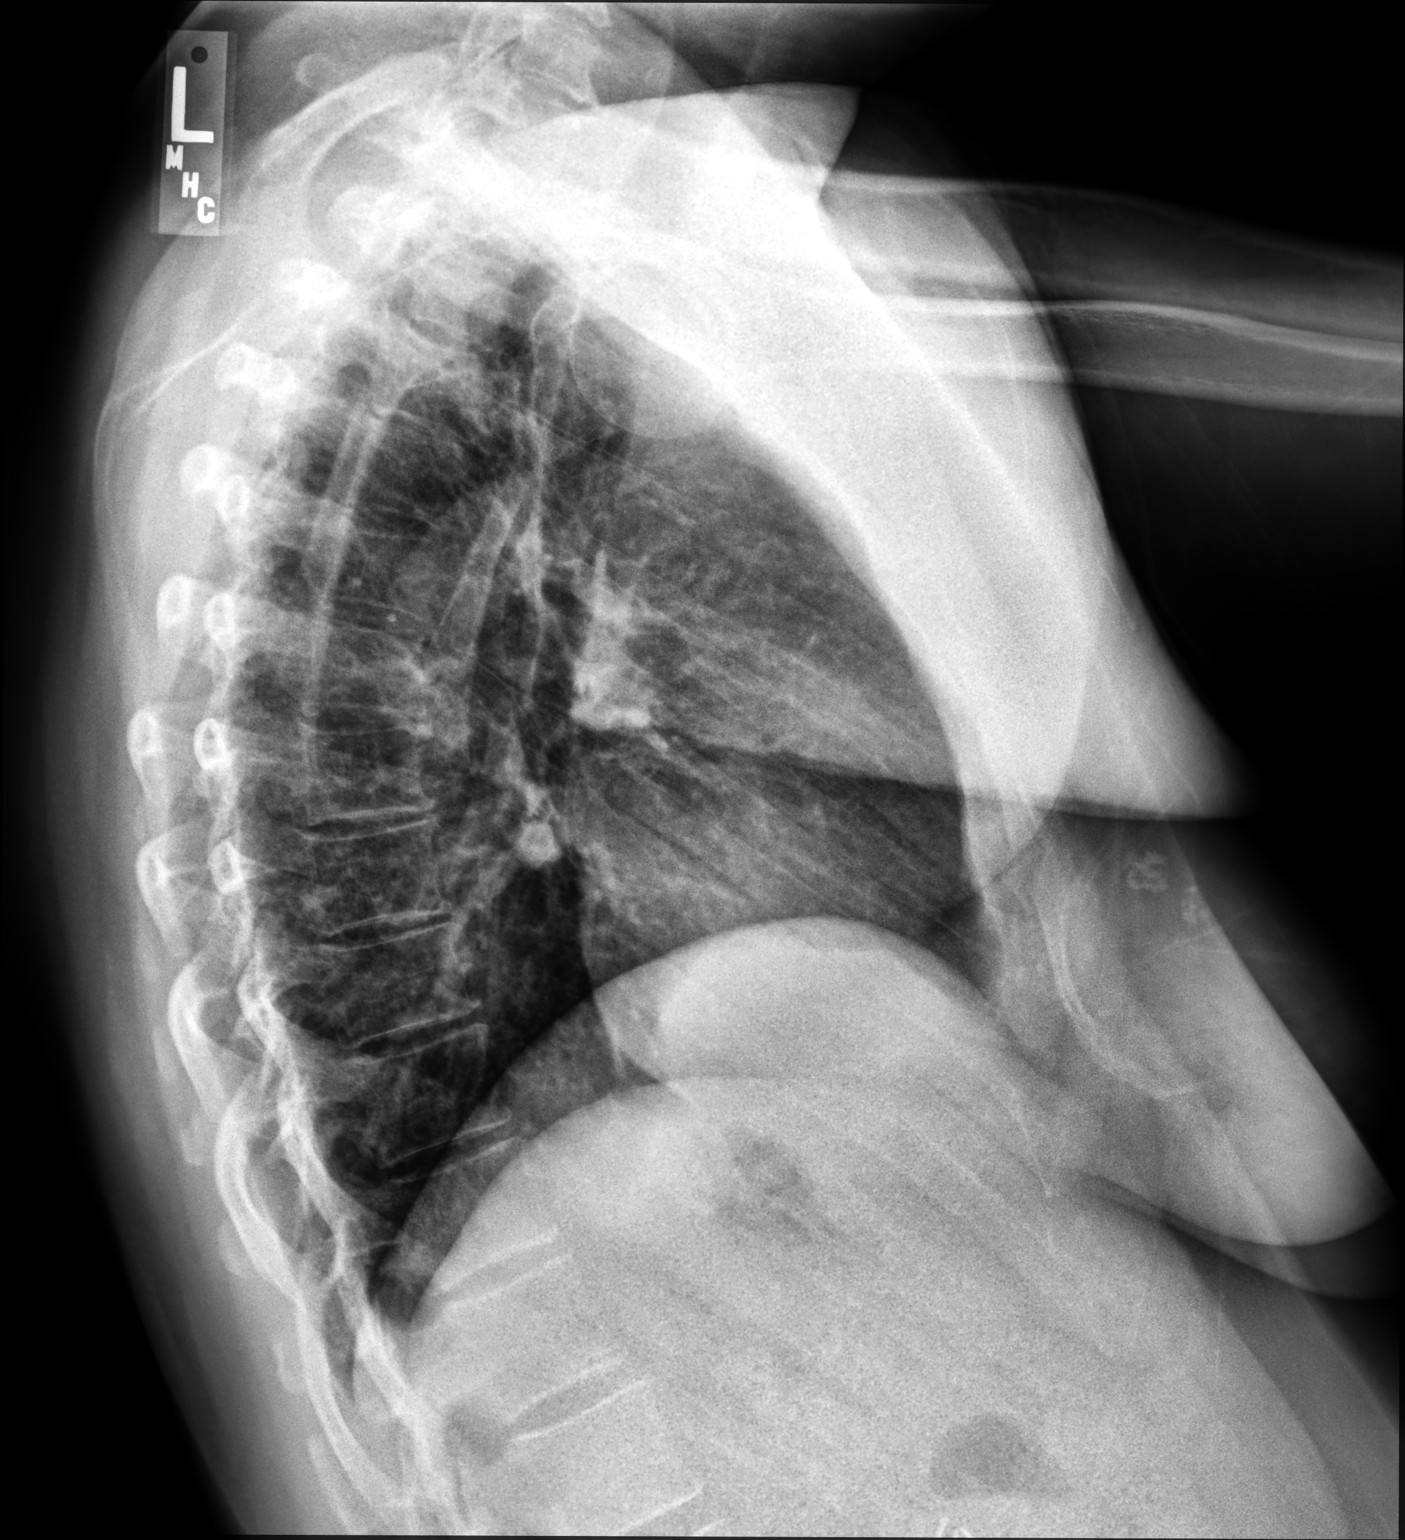

[2 of 2 positions shown; findings below may reference images not displayed]

FINDINGS: The heart size and mediastinal contours are within normal limits.
Both lungs are clear. No visible pleural effusions or pneumothorax.
No acute osseous abnormality.
IMPRESSION: No active cardiopulmonary disease.

## 2021-03-03 ENCOUNTER — Other Ambulatory Visit: Payer: Self-pay | Admitting: Family Medicine

## 2021-03-15 ENCOUNTER — Other Ambulatory Visit: Payer: Self-pay | Admitting: Family Medicine

## 2021-03-23 ENCOUNTER — Other Ambulatory Visit: Payer: Self-pay | Admitting: Family Medicine

## 2021-04-07 ENCOUNTER — Other Ambulatory Visit: Payer: Self-pay | Admitting: Family Medicine

## 2021-04-29 ENCOUNTER — Other Ambulatory Visit: Payer: Self-pay | Admitting: Family Medicine

## 2021-05-11 ENCOUNTER — Other Ambulatory Visit: Payer: Self-pay | Admitting: Family Medicine

## 2021-05-11 NOTE — Telephone Encounter (Signed)
Patient called in, Schedule an appointment with Dr Caryl Bis ,I spoke with Luther Redo. To advise of appt schedule

## 2021-05-11 NOTE — Telephone Encounter (Signed)
LMTCB. Need to scheduled pt an appt with Dr. Caryl Bis.

## 2021-05-31 ENCOUNTER — Other Ambulatory Visit: Payer: Self-pay

## 2021-05-31 ENCOUNTER — Encounter: Payer: Self-pay | Admitting: Family Medicine

## 2021-05-31 ENCOUNTER — Telehealth (INDEPENDENT_AMBULATORY_CARE_PROVIDER_SITE_OTHER): Payer: Self-pay | Admitting: Family Medicine

## 2021-05-31 VITALS — BP 133/103 | HR 91 | Temp 97.9°F | Ht 65.0 in | Wt 195.0 lb

## 2021-05-31 DIAGNOSIS — U071 COVID-19: Secondary | ICD-10-CM

## 2021-05-31 DIAGNOSIS — E1165 Type 2 diabetes mellitus with hyperglycemia: Secondary | ICD-10-CM

## 2021-05-31 DIAGNOSIS — E669 Obesity, unspecified: Secondary | ICD-10-CM

## 2021-05-31 DIAGNOSIS — J4530 Mild persistent asthma, uncomplicated: Secondary | ICD-10-CM

## 2021-05-31 DIAGNOSIS — I10 Essential (primary) hypertension: Secondary | ICD-10-CM

## 2021-05-31 MED ORDER — PANTOPRAZOLE SODIUM 40 MG PO TBEC: 40.0000 mg | DELAYED_RELEASE_TABLET | Freq: Every day | ORAL | 0 refills | Status: DC

## 2021-05-31 MED ORDER — NIRMATRELVIR/RITONAVIR (PAXLOVID)TABLET: 3.0000 | ORAL_TABLET | Freq: Two times a day (BID) | ORAL | 0 refills | Status: AC

## 2021-05-31 NOTE — Assessment & Plan Note (Signed)
Continue albuterol PRN. No need for steroid at this time.

## 2021-05-31 NOTE — Assessment & Plan Note (Signed)
States home sugars have been well controlled recently. She will buy new battery for her glucometer which recently ran out. She has f/u scheduled with PCP already for 07/2021.

## 2021-05-31 NOTE — Assessment & Plan Note (Signed)
Elevated readings today in setting of acute COVID illness and recent sudafed use. She had not been checking recently. Advised to start monitoring BP and let us know if persistently elevated. She will also stop sudafed.

## 2021-05-31 NOTE — Progress Notes (Signed)
Patient ID: Nicole Bryant, female    DOB: 26-May-1970, 51 y.o.   MRN: [id number]  Virtual visit completed through [employer], a video enabled telemedicine application. Due to national recommendations of social distancing due to COVID-19, a virtual visit is felt to be most appropriate for this patient at this time. Reviewed limitations, risks, security and privacy concerns of performing a virtual visit and the availability of in person appointments. I also reviewed that there may be a patient responsible charge related to this service. The patient agreed to proceed.   Patient location: home Provider location: home Persons participating in this virtual visit: patient, provider   If any vitals were documented, they were collected by patient at home unless specified below.    BP (!) 133/103 (BP Location: Right Arm)    Pulse 91    Temp 97.9 F (36.6 C)    Ht _0  (1.651 m)    Wt 195 lb (88.5 kg)    BMI 32.45 kg/m    CC: COVID Subjective:   HPI: Nicole Bryant is a 51 y.o. female presenting on 05/31/2021 for Covid Positive (C/o HA, body aches, nasal congestion and SOB.  Sxs started 05/26/21.  Pos home COVID test- 05/30/21.  Was prescribed prednisone during previous COVID episode 05/2020 due to h/o asthma.  No COVID shot, no flu shot. )    First day of symptoms: 05/26/2021 Tested COVID positive: 05/30/2021  Current symptoms: bad HA, body aches, nasal congestion, mild dyspnea No: fevers/chills, ST, abd pain, nausea, diarrhea, no significant cough.  Treatments to date: albuterol inhaler, tylenol, sudafed yesterday Risk factors include: diabetes, asthma, hypertension, obesity BMI 32  COVID vaccination status: none   BP elevated today despite taking BP meds every night (lisinopril and amlodipine).   Upcoming appt 07/2021 with PCP. Glucometer battery recently died ([employer]) but previously well controlled.      Relevant past medical, surgical, family and social history  reviewed and updated as indicated. Interim medical history since our last visit reviewed. Allergies and medications reviewed and updated. Outpatient Medications Prior to Visit  Medication Sig Dispense Refill   albuterol (VENTOLIN HFA) 108 (90 Base) MCG/ACT inhaler INHALE 2 PUFFS INTO THE LUNGS EVERY 6 HOURS AS NEEDED FOR WHEEZING OR SHORTNESS OF BREATH 8.5 g 0   amLODipine (NORVASC) 10 MG tablet TAKE 1 TABLET BY MOUTH ONCE DAILY 90 tablet 3   Blood Glucose Monitoring Suppl (RELION CONFIRM GLUCOSE MONITOR) w/Device KIT Check your blood glucose once daily before breakfast 1 kit 0   cholecalciferol (VITAMIN D) 1000 units tablet Take 5,000 Units by mouth daily.     CONTOUR NEXT TEST test strip USE TO CHECK BLOOD SUGAR ONCE DAILY BEFORE BREAKFAST 100 each 12   FEROSUL 325 (65 Fe) MG tablet TAKE 1 TABLET BY MOUTH TWICE A DAY WITH MEAL. TAKE WITH VITAMIN CAPSULE 30 tablet 1   fluticasone (FLONASE) 50 MCG/ACT nasal spray USE 2 SPRAYS IN EACH NOSTRIL ONCE DAILY 16 g 2   JARDIANCE 25 MG TABS tablet TAKE 1 TABLET BY MOUTH ONCE A DAY (NEED APPT FOR FURTHER REFILLS) 90 tablet 0   Lancet Devices (ONE TOUCH DELICA LANCING DEV) MISC   0   lisinopril (ZESTRIL) 5 MG tablet TAKE 1 TABLET BY MOUTH ONCE DAILY 30 tablet 1   loratadine (CLARITIN) 10 MG tablet Take 10 mg by mouth daily.     metFORMIN (GLUCOPHAGE) 500 MG tablet TAKE 2 TABLETS BY MOUTH TWICE DAILY WITHA MEAL 360 tablet 1  MICROLET LANCETS MISC CHECK YOUR BLOOD SUGAR ONCE DAILY BEFOREBREAKFAST 100 each 1   omeprazole (PRILOSEC) 40 MG capsule TAKE 1 CAPSULE BY MOUTH TWICE DAILY BEFORE A MEAL 60 capsule 3   ondansetron (ZOFRAN ODT) 8 MG disintegrating tablet Take 1 tablet (8 mg total) by mouth every 8 (eight) hours as needed for nausea or vomiting. 20 tablet 0   triamcinolone cream (KENALOG) 0.1 % APPLY TOPICALLY 2 TIMES DAILY 30 g 0   No facility-administered medications prior to visit.     Per HPI unless specifically indicated in ROS section  below Review of Systems Objective:  BP (!) 133/103 (BP Location: Right Arm)    Pulse 91    Temp 97.9 F (36.6 C)    Ht _0  (1.651 m)    Wt 195 lb (88.5 kg)    BMI 32.45 kg/m   Wt Readings from Last 3 Encounters:  05/31/21 195 lb (88.5 kg)  01/11/21 195 lb (88.5 kg)  12/28/20 194 lb (88 kg)       Physical exam: Gen: alert, NAD, not ill appearing Pulm: speaks in complete sentences without increased work of breathing Psych: normal mood, normal thought content      Lab Results  Component Value Date   CREATININE 0.75 08/22/2019   BUN 11 08/22/2019   NA 136 08/22/2019   K 4.0 08/22/2019   CL 101 08/22/2019   CO2 24 08/22/2019  GFR = 82 (08/2019) Lab Results  Component Value Date   HGBA1C 6.7 (H) 08/22/2019    Assessment & Plan:   Problem List Items Addressed This Visit     Type 2 diabetes mellitus with hyperglycemia, without long-term current use of insulin ([place])    States home sugars have been well controlled recently. She will buy new battery for her glucometer which recently ran out. She has f/u scheduled with PCP already for 07/2021.       Hypertension    Elevated readings today in setting of acute COVID illness and recent sudafed use. She had not been checking recently. Advised to start monitoring BP and let us know if persistently elevated. She will also stop sudafed.       Obesity (BMI 30.0-34.9)   Asthma    Continue albuterol PRN. No need for steroid at this time.       COVID-19 virus infection - Primary    Reviewed currently approved EUA treatments as well as possible side effects to watch for.  Reviewed expected course of illness, anticipated course of recovery, as well as red flags to suggest COVID pneumonia and/or to seek urgent in-person care. Reviewed latest CDC isolation/quarantine guidelines.  Encouraged fluids and rest. Reviewed further supportive care measures at home including vit C 522m bid, vit D 2000 IU daily, zinc 109mdaily, tylenol PRN,  pepcid 2073mID PRN.  Recommend:  Full dose paxlovid Paxlovid drug interactions:  1. amlodipine - monitor BP while on paxlovid, drop dose if needed 2. Fluticasone - monitor effect while on paxlovid.  3. Omeprazole - change PPI while on paxlovid - I've sent in a 7d course of pantoprazole.       Relevant Medications   nirmatrelvir/ritonavir EUA (PAXLOVID) 20 x 150 MG & 10 x 100MG TABS     Meds ordered this encounter  Medications   nirmatrelvir/ritonavir EUA (PAXLOVID) 20 x 150 MG & 10 x 100MG TABS    Sig: Take 3 tablets by mouth 2 (two) times daily for 5 days. (Take nirmatrelvir 150 mg two  tablets twice daily for 5 days and ritonavir 100 mg one tablet twice daily for 5 days) Patient GFR is 82    Dispense:  30 tablet    Refill:  0   pantoprazole (PROTONIX) 40 MG tablet    Sig: Take 1 tablet (40 mg total) by mouth daily. While on paxlovid    Dispense:  7 tablet    Refill:  0   No orders of the defined types were placed in this encounter.   I discussed the assessment and treatment plan with the patient. The patient was provided an opportunity to ask questions and all were answered. The patient agreed with the plan and demonstrated an understanding of the instructions. The patient was advised to call back or seek an in-person evaluation if the symptoms worsen or if the condition fails to improve as anticipated.  Follow up plan: No follow-ups on file.  Ria Bush, MD

## 2021-05-31 NOTE — Assessment & Plan Note (Addendum)
Reviewed currently approved EUA treatments as well as possible side effects to watch for.  Reviewed expected course of illness, anticipated course of recovery, as well as red flags to suggest COVID pneumonia and/or to seek urgent in-person care. Reviewed latest CDC isolation/quarantine guidelines.  Encouraged fluids and rest. Reviewed further supportive care measures at home including vit C 500mg  bid, vit D 2000 IU daily, zinc 100mg  daily, tylenol PRN, pepcid 20mg  BID PRN.  Recommend:  Full dose paxlovid Paxlovid drug interactions:  1. amlodipine - monitor BP while on paxlovid, drop dose if needed 2. Fluticasone - monitor effect while on paxlovid.  3. Omeprazole - change PPI while on paxlovid - I've sent in a 7d course of pantoprazole.

## 2021-06-01 ENCOUNTER — Encounter: Payer: Self-pay | Admitting: Family Medicine

## 2021-07-19 ENCOUNTER — Ambulatory Visit (INDEPENDENT_AMBULATORY_CARE_PROVIDER_SITE_OTHER): Payer: 59 | Admitting: Family Medicine

## 2021-07-19 ENCOUNTER — Encounter: Payer: Self-pay | Admitting: Family Medicine

## 2021-07-19 ENCOUNTER — Other Ambulatory Visit: Payer: Self-pay

## 2021-07-19 VITALS — BP 138/90 | HR 81 | Temp 97.8°F | Ht 64.0 in | Wt 203.6 lb

## 2021-07-19 DIAGNOSIS — Z8616 Personal history of COVID-19: Secondary | ICD-10-CM

## 2021-07-19 DIAGNOSIS — I10 Essential (primary) hypertension: Secondary | ICD-10-CM | POA: Diagnosis not present

## 2021-07-19 DIAGNOSIS — E785 Hyperlipidemia, unspecified: Secondary | ICD-10-CM

## 2021-07-19 DIAGNOSIS — E1165 Type 2 diabetes mellitus with hyperglycemia: Secondary | ICD-10-CM

## 2021-07-19 DIAGNOSIS — N951 Menopausal and female climacteric states: Secondary | ICD-10-CM

## 2021-07-19 DIAGNOSIS — H6980 Other specified disorders of Eustachian tube, unspecified ear: Secondary | ICD-10-CM | POA: Insufficient documentation

## 2021-07-19 DIAGNOSIS — Z1231 Encounter for screening mammogram for malignant neoplasm of breast: Secondary | ICD-10-CM

## 2021-07-19 DIAGNOSIS — H6982 Other specified disorders of Eustachian tube, left ear: Secondary | ICD-10-CM

## 2021-07-19 NOTE — Assessment & Plan Note (Signed)
Adequate control at home.  She will continue amlodipine 10 mg once daily and lisinopril 5 mg daily.  I suspect some measure of whitecoat hypertension. ?

## 2021-07-19 NOTE — Assessment & Plan Note (Addendum)
Her intermittent left ear issues are likely related to eustachian tube dysfunction.  Discussed trial of a different second-generation antihistamine.  If this is not beneficial she will let us know.  If she has persistent ear fullness or tinnitus she will let us know as well. ?

## 2021-07-19 NOTE — Patient Instructions (Addendum)
Nice to see you. ?Please contact your gynecologist as we discussed. ?We will contact you with your lab results. ?Please call [phone] to schedule your mammogram once you confirm with your insurance if you can have it at [facility]. ?

## 2021-07-19 NOTE — Assessment & Plan Note (Signed)
Check lipid panel  

## 2021-07-19 NOTE — Assessment & Plan Note (Signed)
Recovered.  Discussed the benefits of getting the COVID-19 vaccine.  The patient declines. ?

## 2021-07-19 NOTE — Assessment & Plan Note (Signed)
Patient has perimenopausal symptoms.  I encouraged her to contact her gynecologist to discuss treatment options. ?

## 2021-07-19 NOTE — Assessment & Plan Note (Signed)
Slightly above goal.  We will check an A1c.  She will continue Jardiance 25 mg once daily and metformin 1000 mg twice daily.  We are requesting ophthalmology records. ?

## 2021-07-19 NOTE — Progress Notes (Signed)
?[name], MD ?Phone: [phone] ? ?Nicole Bryant is a 51 y.o. female who presents today for follow-up. ? ?DIABETES ?Disease Monitoring: ?Blood Sugar ranges-fasting 150s, 2 hour postprandial avg 180 Polyuria/phagia/dipsia- no      Optho- UTD ?Medications: ?Compliance- taking jardiance, metformin Hypoglycemic symptoms- no ? ?HYPERTENSION ?Disease Monitoring ?Home BP Monitoring 115/70 Chest pain- no    Dyspnea- no ?Medications ?Compliance-  taking lisinopril, amlodipine.  Edema- no ?BMET ?   ?Component Value Date/Time  ? NA 136 08/22/2019 0820  ? K 4.0 08/22/2019 0820  ? CL 101 08/22/2019 0820  ? CO2 24 08/22/2019 0820  ? GLUCOSE 172 (H) 08/22/2019 0820  ? BUN 11 08/22/2019 0820  ? CREATININE 0.75 08/22/2019 0820  ? CREATININE 0.80 02/22/2019 1641  ? CALCIUM 9.5 08/22/2019 0820  ? GFRNONAA >60 06/21/2017 2001  ? GFRAA >60 06/21/2017 2001  ? ?History of COVID-19: Patient had COVID back in January.  She reports this is her third episode.  She notes she has fully recovered.  She declines COVID vaccinations. ? ?Left ear fullness: Patient notes this occurs in her left ear when there is a change in weather.  No tinnitus.  Notes its not bothering her too much now.  She does take Claritin and uses a nasal spray for allergies. ? ?Menopausal symptoms: Patient reports her menstrual cycles have become somewhat irregular.  She notes a year or so ago she went about 4 months without a menstrual cycle and then it recurred after her colonoscopy.  She has been having some hot flashes.  She has been having some dry vaginal mucosa.  She reports an LMP on January 28.  She does follow with gynecology. ? ? ?Social History  ? ?Tobacco Use  ?Smoking Status Never  ?Smokeless Tobacco Never  ? ? ?Current Outpatient Medications on File Prior to Visit  ?Medication Sig Dispense Refill  ? albuterol (VENTOLIN HFA) 108 (90 Base) MCG/ACT inhaler INHALE 2 PUFFS INTO THE LUNGS EVERY 6 HOURS AS NEEDED FOR WHEEZING OR SHORTNESS OF  BREATH 8.5 g 0  ? amLODipine (NORVASC) 10 MG tablet TAKE 1 TABLET BY MOUTH ONCE DAILY 90 tablet 3  ? Blood Glucose Monitoring Suppl (RELION CONFIRM GLUCOSE MONITOR) w/Device KIT Check your blood glucose once daily before breakfast 1 kit 0  ? cholecalciferol (VITAMIN D) 1000 units tablet Take 5,000 Units by mouth daily.    ? CONTOUR NEXT TEST test strip USE TO CHECK BLOOD SUGAR ONCE DAILY BEFORE BREAKFAST 100 each 12  ? FEROSUL 325 (65 Fe) MG tablet TAKE 1 TABLET BY MOUTH TWICE A DAY WITH MEAL. TAKE WITH VITAMIN CAPSULE 30 tablet 1  ? fluticasone (FLONASE) 50 MCG/ACT nasal spray USE 2 SPRAYS IN EACH NOSTRIL ONCE DAILY 16 g 2  ? JARDIANCE 25 MG TABS tablet TAKE 1 TABLET BY MOUTH ONCE A DAY (NEED APPT FOR FURTHER REFILLS) 90 tablet 0  ? Lancet Devices (ONE TOUCH DELICA LANCING DEV) MISC   0  ? lisinopril (ZESTRIL) 5 MG tablet TAKE 1 TABLET BY MOUTH ONCE DAILY 30 tablet 1  ? loratadine (CLARITIN) 10 MG tablet Take 10 mg by mouth daily.    ? metFORMIN (GLUCOPHAGE) 500 MG tablet TAKE 2 TABLETS BY MOUTH TWICE DAILY WITHA MEAL 360 tablet 1  ? MICROLET LANCETS MISC CHECK YOUR BLOOD SUGAR ONCE DAILY BEFOREBREAKFAST 100 each 1  ? omeprazole (PRILOSEC) 40 MG capsule TAKE 1 CAPSULE BY MOUTH TWICE DAILY BEFORE A MEAL 60 capsule 3  ? ondansetron (ZOFRAN ODT) 8 MG  disintegrating tablet Take 1 tablet (8 mg total) by mouth every 8 (eight) hours as needed for nausea or vomiting. 20 tablet 0  ? pantoprazole (PROTONIX) 40 MG tablet Take 1 tablet (40 mg total) by mouth daily. While on paxlovid 7 tablet 0  ? triamcinolone cream (KENALOG) 0.1 % APPLY TOPICALLY 2 TIMES DAILY 30 g 0  ? ?No current facility-administered medications on file prior to visit.  ? ? ? ?ROS see history of present illness ? ?Objective ? ?Physical Exam ?Vitals:  ? 07/19/21 1425 07/19/21 1440  ?BP: (!) 140/100 138/90  ?Pulse: 81   ?Temp: 97.8 ?F (36.6 ?C)   ?SpO2: 99%   ? ? ?BP Readings from Last 3 Encounters:  ?07/19/21 138/90  ?05/31/21 (!) 133/103  ?01/11/21 117/78   ? ?Wt Readings from Last 3 Encounters:  ?07/19/21 203 lb 9.6 oz (92.4 kg)  ?05/31/21 195 lb (88.5 kg)  ?01/11/21 195 lb (88.5 kg)  ? ? ?Physical Exam ?Constitutional:   ?   General: She is not in acute distress. ?   Appearance: She is not diaphoretic.  ?HENT:  ?   Right Ear: Tympanic membrane normal.  ?   Left Ear: Tympanic membrane normal.  ?Cardiovascular:  ?   Rate and Rhythm: Normal rate and regular rhythm.  ?   Heart sounds: Normal heart sounds.  ?Pulmonary:  ?   Effort: Pulmonary effort is normal.  ?   Breath sounds: Normal breath sounds.  ?Skin: ?   General: Skin is warm and dry.  ?Neurological:  ?   Mental Status: She is alert.  ? ?Diabetic Foot Exam - Simple   ?Simple Foot Form ?Diabetic Foot exam was performed with the following findings: Yes 07/19/2021  2:40 PM  ?Visual Inspection ?No deformities, no ulcerations, no other skin breakdown bilaterally: Yes ?Sensation Testing ?Intact to touch and monofilament testing bilaterally: Yes ?Pulse Check ?Posterior Tibialis and Dorsalis pulse intact bilaterally: Yes ?Comments ?  ? ? ? ?Assessment/Plan: Please see individual problem list. ? ?Problem List Items Addressed This Visit   ? ? Hyperlipidemia - Primary (Chronic)  ?  Check lipid panel. ?  ?  ? Relevant Orders  ? Comp Met (CMET)  ? Lipid panel  ? Eustachian tube dysfunction  ?  Her intermittent left ear issues are likely related to eustachian tube dysfunction.  Discussed trial of a different second-generation antihistamine.  If this is not beneficial she will let us know.  If she has persistent ear fullness or tinnitus she will let us know as well. ?  ?  ? History of COVID-19  ?  Recovered.  Discussed the benefits of getting the COVID-19 vaccine.  The patient declines. ?  ?  ? Hypertension  ?  Adequate control at home.  She will continue amlodipine 10 mg once daily and lisinopril 5 mg daily.  I suspect some measure of whitecoat hypertension. ?  ?  ? Relevant Orders  ? Comp Met (CMET)  ? Perimenopause  ?  Patient  has perimenopausal symptoms.  I encouraged her to contact her gynecologist to discuss treatment options. ?  ?  ? Type 2 diabetes mellitus with hyperglycemia, without long-term current use of insulin ([place])  ?  Slightly above goal.  We will check an A1c.  She will continue Jardiance 25 mg once daily and metformin 1000 mg twice daily.  We are requesting ophthalmology records. ?  ?  ? Relevant Orders  ? HgB A1c  ? ?Other Visit Diagnoses   ? ?  Encounter for screening mammogram for malignant neoplasm of breast      ? Relevant Orders  ? MM 3D SCREEN BREAST BILATERAL  ? ?  ? ? ? ?Health Maintenance: The patient will call to schedule mammogram after confirming that she can have this done through [facility] with her insurance. ? ?Return in about 6 months (around 01/19/2022). ? ?This visit occurred during the SARS-CoV-2 public health emergency.  Safety protocols were in place, including screening questions prior to the visit, additional usage of staff PPE, and extensive cleaning of exam room while observing appropriate contact time as indicated for disinfecting solutions.  ? ? ?[name], MD ?[place] ? ?

## 2021-07-20 ENCOUNTER — Other Ambulatory Visit: Payer: Self-pay | Admitting: Family Medicine

## 2021-07-20 LAB — COMPREHENSIVE METABOLIC PANEL WITH GFR
ALT: 31 U/L (ref 0–35)
AST: 20 U/L (ref 0–37)
Albumin: 4.9 g/dL (ref 3.5–5.2)
Alkaline Phosphatase: 81 U/L (ref 39–117)
BUN: 12 mg/dL (ref 6–23)
CO2: 23 meq/L (ref 19–32)
Calcium: 9.9 mg/dL (ref 8.4–10.5)
Chloride: 99 meq/L (ref 96–112)
Creatinine, Ser: 0.68 mg/dL (ref 0.40–1.20)
GFR: 101.31 mL/min
Glucose, Bld: 222 mg/dL — ABNORMAL HIGH (ref 70–99)
Potassium: 4.1 meq/L (ref 3.5–5.1)
Sodium: 135 meq/L (ref 135–145)
Total Bilirubin: 1 mg/dL (ref 0.2–1.2)
Total Protein: 7.5 g/dL (ref 6.0–8.3)

## 2021-07-20 LAB — LIPID PANEL
Cholesterol: 227 mg/dL — ABNORMAL HIGH (ref 0–200)
HDL: 44.6 mg/dL (ref >39.00)
Total CHOL/HDL Ratio: 5
Triglycerides: 530 mg/dL — ABNORMAL HIGH (ref 0.0–149.0)

## 2021-07-20 LAB — HEMOGLOBIN A1C: Hgb A1c MFr Bld: 8.1 % — ABNORMAL HIGH (ref 4.6–6.5)

## 2021-07-20 LAB — LDL CHOLESTEROL, DIRECT: Direct LDL: 133 mg/dL

## 2021-07-22 ENCOUNTER — Telehealth: Payer: Self-pay

## 2021-07-22 NOTE — Telephone Encounter (Signed)
LMTCB for lab results.  

## 2021-07-22 NOTE — Telephone Encounter (Signed)
The patient saw her results on Mychart . She wants to try diet and exercise and return for her 6 month follow up in September for a recheck .  The patient does not have a gallbladder nor a parathyroid.  ?

## 2021-07-26 NOTE — Telephone Encounter (Signed)
Noted.  I would really encourage her to consider starting another medication for this.  If she does not want to do this she does need to work on diet and exercise and continue her current diabetes medication.  She will need follow-up in about 3 months to follow-up on her diabetes. ?

## 2021-07-28 ENCOUNTER — Other Ambulatory Visit: Payer: Self-pay | Admitting: Family Medicine

## 2021-07-30 ENCOUNTER — Encounter: Payer: Self-pay | Admitting: *Deleted

## 2021-07-30 NOTE — Telephone Encounter (Signed)
Mychart message sent.

## 2021-08-10 ENCOUNTER — Other Ambulatory Visit: Payer: Self-pay | Admitting: Family Medicine

## 2021-08-18 ENCOUNTER — Other Ambulatory Visit: Payer: Self-pay | Admitting: Family

## 2021-09-02 ENCOUNTER — Other Ambulatory Visit: Payer: Self-pay | Admitting: Family Medicine

## 2021-09-06 ENCOUNTER — Encounter: Payer: Self-pay | Admitting: Family Medicine

## 2021-09-06 MED ORDER — OMEPRAZOLE 40 MG PO CPDR: 40.0000 mg | DELAYED_RELEASE_CAPSULE | Freq: Every day | ORAL | 3 refills | Status: DC

## 2021-10-20 ENCOUNTER — Other Ambulatory Visit: Payer: Self-pay | Admitting: Family Medicine

## 2021-10-27 ENCOUNTER — Other Ambulatory Visit: Payer: Self-pay | Admitting: Family

## 2021-10-29 ENCOUNTER — Encounter: Payer: Self-pay | Admitting: Family Medicine

## 2021-10-29 ENCOUNTER — Other Ambulatory Visit: Payer: Self-pay | Admitting: Family Medicine

## 2021-10-29 MED ORDER — EMPAGLIFLOZIN 25 MG PO TABS: 25.0000 mg | ORAL_TABLET | Freq: Every day | ORAL | 1 refills | Status: DC

## 2021-11-09 ENCOUNTER — Other Ambulatory Visit: Payer: Self-pay | Admitting: Family Medicine

## 2021-11-09 ENCOUNTER — Other Ambulatory Visit: Payer: Self-pay | Admitting: Family

## 2021-11-10 ENCOUNTER — Encounter: Payer: Self-pay | Admitting: Family Medicine

## 2021-11-10 DIAGNOSIS — I10 Essential (primary) hypertension: Secondary | ICD-10-CM

## 2021-11-10 MED ORDER — LISINOPRIL 5 MG PO TABS: 5.0000 mg | ORAL_TABLET | Freq: Every day | ORAL | 0 refills | Status: DC

## 2021-11-25 ENCOUNTER — Other Ambulatory Visit: Payer: Self-pay | Admitting: Family Medicine

## 2021-12-15 ENCOUNTER — Other Ambulatory Visit: Payer: Self-pay | Admitting: Family Medicine

## 2022-01-04 ENCOUNTER — Ambulatory Visit (INDEPENDENT_AMBULATORY_CARE_PROVIDER_SITE_OTHER): Payer: 59 | Admitting: Family Medicine

## 2022-01-04 ENCOUNTER — Encounter: Payer: Self-pay | Admitting: Family Medicine

## 2022-01-04 VITALS — BP 130/70 | HR 82 | Temp 97.9°F | Ht 64.0 in | Wt 175.2 lb

## 2022-01-04 DIAGNOSIS — E1165 Type 2 diabetes mellitus with hyperglycemia: Secondary | ICD-10-CM | POA: Diagnosis not present

## 2022-01-04 DIAGNOSIS — E611 Iron deficiency: Secondary | ICD-10-CM

## 2022-01-04 DIAGNOSIS — E785 Hyperlipidemia, unspecified: Secondary | ICD-10-CM

## 2022-01-04 DIAGNOSIS — I10 Essential (primary) hypertension: Secondary | ICD-10-CM

## 2022-01-04 LAB — HEMOGLOBIN A1C: Hgb A1c MFr Bld: 6.5 % (ref 4.6–6.5)

## 2022-01-04 LAB — BASIC METABOLIC PANEL
BUN: 10 mg/dL (ref 6–23)
CO2: 27 mEq/L (ref 19–32)
Calcium: 9.5 mg/dL (ref 8.4–10.5)
Chloride: 101 mEq/L (ref 96–112)
Creatinine, Ser: 0.7 mg/dL (ref 0.40–1.20)
GFR: 100.28 mL/min (ref >60.00)
Glucose, Bld: 118 mg/dL — ABNORMAL HIGH (ref 70–99)
Potassium: 3.7 mEq/L (ref 3.5–5.1)
Sodium: 137 mEq/L (ref 135–145)

## 2022-01-04 LAB — IBC + FERRITIN
Ferritin: 105.4 ng/mL (ref 10.0–291.0)
Iron: 84 ug/dL (ref 42–145)
Saturation Ratios: 21.2 % (ref 20.0–50.0)
TIBC: 396.2 ug/dL (ref 250.0–450.0)
Transferrin: 283 mg/dL (ref 212.0–360.0)

## 2022-01-04 LAB — CBC
HCT: 46.7 % — ABNORMAL HIGH (ref 36.0–46.0)
Hemoglobin: 15.5 g/dL — ABNORMAL HIGH (ref 12.0–15.0)
MCHC: 33.1 g/dL (ref 30.0–36.0)
MCV: 92.5 fl (ref 78.0–100.0)
Platelets: 202 10*3/uL (ref 150.0–400.0)
RBC: 5.05 Mil/uL (ref 3.87–5.11)
RDW: 13.2 % (ref 11.5–15.5)
WBC: 5.3 10*3/uL (ref 4.0–10.5)

## 2022-01-04 LAB — LDL CHOLESTEROL, DIRECT: Direct LDL: 132 mg/dL

## 2022-01-04 NOTE — Assessment & Plan Note (Signed)
Check A1c.  Continue Jardiance 25 mg daily and metformin 1000 mg twice daily.  Discussed potentially reducing the dosages of her medications depending on what her A1c is.

## 2022-01-04 NOTE — Assessment & Plan Note (Signed)
Check direct LDL.  Continue diet and exercise.  Discussed the recommendation for starting on a statin given that she has diabetes though if her LDL is less than 70 I did discuss that we could potentially monitor.

## 2022-01-04 NOTE — Assessment & Plan Note (Signed)
History of this.  We will check labs today.

## 2022-01-04 NOTE — Patient Instructions (Signed)
Nice to see you. We will contact you with your lab results. Please continue with healthy diet and exercise.

## 2022-01-04 NOTE — Progress Notes (Signed)
[name], MD Phone: [phone]  Nicole Bryant is a 51 y.o. female who presents today for f/u.  HYPERTENSION Disease Monitoring: Blood pressure range-116/75 Chest pain- no      Dyspnea- no Medications: Compliance- taking lisinopril, amlodipine   Edema- no  DIABETES Disease Monitoring: Blood Sugar ranges-140 avg Polyuria/phagia/dipsia- no      Medications: Compliance- taking metformin, jardiance Hypoglycemic symptoms-2 times a week as sensation of low sugar, eats or drinks something sugary and this improves  HYPERLIPIDEMIA Disease Monitoring: See symptoms for Hypertension Medications: Compliance- no medications Patient has significantly worked on diet and exercise.  She is working on portion control.  She not eating much sugar.  She is eating lean meats.  She is walking 7 days a week.    Social History   Tobacco Use  Smoking Status Never  Smokeless Tobacco Never    Current Outpatient Medications on File Prior to Visit  Medication Sig Dispense Refill   amLODipine (NORVASC) 10 MG tablet TAKE 1 TABLET BY MOUTH ONCE DAILY 90 tablet 3   Blood Glucose Monitoring Suppl (RELION CONFIRM GLUCOSE MONITOR) w/Device KIT Check your blood glucose once daily before breakfast 1 kit 0   cholecalciferol (VITAMIN D) 1000 units tablet Take 5,000 Units by mouth daily.     CONTOUR NEXT TEST test strip USE TO CHECK BLOOD SUGAR ONCE DAILY BEFORE BREAKFAST 100 each 12   empagliflozin (JARDIANCE) 25 MG TABS tablet Take 1 tablet (25 mg total) by mouth daily. 90 tablet 1   FEROSUL 325 (65 Fe) MG tablet TAKE 1 TABLET BY MOUTH TWICE A DAY WITH MEAL. TAKE WITH VITAMIN CAPSULE 30 tablet 1   fexofenadine (ALLEGRA ALLERGY) 180 MG tablet      fluticasone (FLONASE) 50 MCG/ACT nasal spray USE 2 SPRAYS IN EACH NOSTRIL ONCE DAILY 16 g 2   Lancet Devices (ONE TOUCH DELICA LANCING DEV) MISC   0   lisinopril (ZESTRIL) 5 MG tablet Take 1 tablet (5 mg total) by mouth daily. 90 tablet 0    metFORMIN (GLUCOPHAGE) 500 MG tablet TAKE 2 TABLETS BY MOUTH TWICE DAILY WITHA MEAL 360 tablet 1   MICROLET LANCETS MISC CHECK YOUR BLOOD SUGAR ONCE DAILY BEFOREBREAKFAST 100 each 1   omeprazole (PRILOSEC) 40 MG capsule Take 1 capsule (40 mg total) by mouth daily. 30 capsule 3   ondansetron (ZOFRAN ODT) 8 MG disintegrating tablet Take 1 tablet (8 mg total) by mouth every 8 (eight) hours as needed for nausea or vomiting. 20 tablet 0   PROAIR HFA 108 (90 Base) MCG/ACT inhaler INHALE 2 PUFFS INTO THE LUNGS EVERY 6 HOURS AS NEEDED FOR WHEEZING OR SHORTNESS OF BREATH 8.5 g 0   triamcinolone cream (KENALOG) 0.1 % APPLY TOPICALLY 2 TIMES DAILY 30 g 0   No current facility-administered medications on file prior to visit.     ROS see history of present illness  Objective  Physical Exam Vitals:   01/04/22 0952  BP: 130/70  Pulse: 82  Temp: 97.9 F (36.6 C)  SpO2: 99%    BP Readings from Last 3 Encounters:  01/04/22 130/70  07/19/21 138/90  05/31/21 (!) 133/103   Wt Readings from Last 3 Encounters:  01/04/22 175 lb 3.2 oz (79.5 kg)  07/19/21 203 lb 9.6 oz (92.4 kg)  05/31/21 195 lb (88.5 kg)    Physical Exam Constitutional:      General: She is not in acute distress.    Appearance: She is not diaphoretic.  Cardiovascular:  Rate and Rhythm: Normal rate and regular rhythm.     Heart sounds: Normal heart sounds.  Pulmonary:     Effort: Pulmonary effort is normal.     Breath sounds: Normal breath sounds.  Skin:    General: Skin is warm and dry.  Neurological:     Mental Status: She is alert.      Assessment/Plan: Please see individual problem list.  Problem List Items Addressed This Visit     Hyperlipidemia (Chronic)    Check direct LDL.  Continue diet and exercise.  Discussed the recommendation for starting on a statin given that she has diabetes though if her LDL is less than 70 I did discuss that we could potentially monitor.      Relevant Orders   Direct LDL    Hypertension - Primary (Chronic)    Well-controlled.  She will continue amlodipine 10 mg daily and lisinopril 5 mg daily.      Relevant Orders   Basic Metabolic Panel (BMET)   Type 2 diabetes mellitus with hyperglycemia, without long-term current use of insulin ([facility]) (Chronic)    Check A1c.  Continue Jardiance 25 mg daily and metformin 1000 mg twice daily.  Discussed potentially reducing the dosages of her medications depending on what her A1c is.      Relevant Orders   HgB A1c   Urine Microalbumin w/creat. ratio   Iron deficiency    History of this.  We will check labs today.      Relevant Orders   IBC + Ferritin   CBC     Return in about 6 months (around 07/07/2022) for Diabetes/hypertension.   [name], MD [place]

## 2022-01-04 NOTE — Assessment & Plan Note (Signed)
Well-controlled.  She will continue amlodipine 10 mg daily and lisinopril 5 mg daily.

## 2022-01-05 LAB — MICROALBUMIN / CREATININE URINE RATIO
Creatinine,U: 6.5 mg/dL
Microalb Creat Ratio: 10.7 mg/g (ref 0.0–30.0)
Microalb, Ur: <0.7 mg/dL (ref 0.0–1.9)

## 2022-01-10 ENCOUNTER — Other Ambulatory Visit: Payer: Self-pay | Admitting: Family Medicine

## 2022-01-10 DIAGNOSIS — D582 Other hemoglobinopathies: Secondary | ICD-10-CM

## 2022-01-24 ENCOUNTER — Ambulatory Visit: Payer: 59 | Admitting: Family Medicine

## 2022-01-25 ENCOUNTER — Ambulatory Visit: Payer: 59 | Admitting: Family Medicine

## 2022-02-15 ENCOUNTER — Other Ambulatory Visit: Payer: Self-pay | Admitting: Family Medicine

## 2022-02-15 DIAGNOSIS — I10 Essential (primary) hypertension: Secondary | ICD-10-CM

## 2022-03-02 ENCOUNTER — Other Ambulatory Visit: Payer: Self-pay | Admitting: Family Medicine

## 2022-04-05 ENCOUNTER — Other Ambulatory Visit: Payer: Self-pay | Admitting: Family

## 2022-04-25 ENCOUNTER — Other Ambulatory Visit: Payer: Self-pay | Admitting: Family Medicine

## 2022-05-12 ENCOUNTER — Other Ambulatory Visit: Payer: Self-pay | Admitting: Family Medicine

## 2022-05-12 DIAGNOSIS — I10 Essential (primary) hypertension: Secondary | ICD-10-CM

## 2022-05-17 ENCOUNTER — Other Ambulatory Visit (INDEPENDENT_AMBULATORY_CARE_PROVIDER_SITE_OTHER): Payer: BC Managed Care – PPO

## 2022-05-17 DIAGNOSIS — D582 Other hemoglobinopathies: Secondary | ICD-10-CM | POA: Diagnosis not present

## 2022-05-17 LAB — CBC
HCT: 47 % — ABNORMAL HIGH (ref 36.0–46.0)
Hemoglobin: 15.6 g/dL — ABNORMAL HIGH (ref 12.0–15.0)
MCHC: 33.2 g/dL (ref 30.0–36.0)
MCV: 93.1 fl (ref 78.0–100.0)
Platelets: 267 10*3/uL (ref 150.0–400.0)
RBC: 5.05 Mil/uL (ref 3.87–5.11)
RDW: 13.6 % (ref 11.5–15.5)
WBC: 7.1 10*3/uL (ref 4.0–10.5)

## 2022-05-17 LAB — IBC + FERRITIN
Ferritin: 96 ng/mL (ref 10.0–291.0)
Iron: 96 ug/dL (ref 42–145)
Saturation Ratios: 26.2 % (ref 20.0–50.0)
TIBC: 366.8 ug/dL (ref 250.0–450.0)
Transferrin: 262 mg/dL (ref 212.0–360.0)

## 2022-05-19 ENCOUNTER — Other Ambulatory Visit: Payer: Self-pay | Admitting: Family Medicine

## 2022-05-19 DIAGNOSIS — D582 Other hemoglobinopathies: Secondary | ICD-10-CM

## 2022-05-31 ENCOUNTER — Telehealth: Payer: Self-pay | Admitting: *Deleted

## 2022-05-31 ENCOUNTER — Encounter: Payer: BC Managed Care – PPO | Admitting: Oncology

## 2022-05-31 ENCOUNTER — Other Ambulatory Visit: Payer: BC Managed Care – PPO

## 2022-05-31 NOTE — Telephone Encounter (Signed)
Nurse placed call to patient to review appointment details for upcoming new oncology patient visit. Patient is scheduled for 1/17 at 2:00 pm for visit with Dr. Rogue Bussing. Verified appointment details with patient. Patient advised that she can have 1 support person and that mandatory masking is in place. Patient denies any questions or concerns at this time.

## 2022-06-01 ENCOUNTER — Encounter: Payer: Self-pay | Admitting: Internal Medicine

## 2022-06-01 ENCOUNTER — Inpatient Hospital Stay: Payer: BC Managed Care – PPO

## 2022-06-01 ENCOUNTER — Inpatient Hospital Stay: Payer: BC Managed Care – PPO | Attending: Oncology | Admitting: Internal Medicine

## 2022-06-01 VITALS — BP 146/92 | HR 96 | Temp 97.4°F | Resp 18 | Wt 181.6 lb

## 2022-06-01 DIAGNOSIS — D751 Secondary polycythemia: Secondary | ICD-10-CM | POA: Diagnosis not present

## 2022-06-01 LAB — CBC WITH DIFFERENTIAL/PLATELET
Abs Immature Granulocytes: 0.07 10*3/uL (ref 0.00–0.07)
Basophils Absolute: 0.1 10*3/uL (ref 0.0–0.1)
Basophils Relative: 1 %
Eosinophils Absolute: 0.2 10*3/uL (ref 0.0–0.5)
Eosinophils Relative: 2 %
HCT: 47.9 % — ABNORMAL HIGH (ref 36.0–46.0)
Hemoglobin: 15.5 g/dL — ABNORMAL HIGH (ref 12.0–15.0)
Immature Granulocytes: 1 %
Lymphocytes Relative: 23 %
Lymphs Abs: 1.7 10*3/uL (ref 0.7–4.0)
MCH: 30.2 pg (ref 26.0–34.0)
MCHC: 32.4 g/dL (ref 30.0–36.0)
MCV: 93.4 fL (ref 80.0–100.0)
Monocytes Absolute: 0.5 10*3/uL (ref 0.1–1.0)
Monocytes Relative: 6 %
Neutro Abs: 5 10*3/uL (ref 1.7–7.7)
Neutrophils Relative %: 67 %
Platelets: 243 10*3/uL (ref 150–400)
RBC: 5.13 MIL/uL — ABNORMAL HIGH (ref 3.87–5.11)
RDW: 12.7 % (ref 11.5–15.5)
WBC: 7.5 10*3/uL (ref 4.0–10.5)
nRBC: 0 % (ref 0.0–0.2)

## 2022-06-01 LAB — COMPREHENSIVE METABOLIC PANEL
ALT: 21 U/L (ref 0–44)
AST: 19 U/L (ref 15–41)
Albumin: 4.6 g/dL (ref 3.5–5.0)
Alkaline Phosphatase: 60 U/L (ref 38–126)
Anion gap: 10 (ref 5–15)
BUN: 12 mg/dL (ref 6–20)
CO2: 24 mmol/L (ref 22–32)
Calcium: 8.9 mg/dL (ref 8.9–10.3)
Chloride: 103 mmol/L (ref 98–111)
Creatinine, Ser: 0.79 mg/dL (ref 0.44–1.00)
GFR, Estimated: >60 mL/min (ref >60)
Glucose, Bld: 164 mg/dL — ABNORMAL HIGH (ref 70–99)
Potassium: 3.8 mmol/L (ref 3.5–5.1)
Sodium: 137 mmol/L (ref 135–145)
Total Bilirubin: 0.5 mg/dL (ref 0.3–1.2)
Total Protein: 8 g/dL (ref 6.5–8.1)

## 2022-06-01 LAB — LACTATE DEHYDROGENASE: LDH: 107 U/L (ref 98–192)

## 2022-06-01 NOTE — Progress Notes (Signed)
New patient evaluation with no acute problems/concerns.

## 2022-06-01 NOTE — Assessment & Plan Note (Addendum)
#  Erythrocytosis--asymptomatic.   I had a long discussion the patient regarding the potential causes of her abnormal blood counts-both primary bone marrow problem versus reactive/secondary causes.  For now I  recommend checking CBC;CMP jak 2; LDH.  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  Also discussed regarding bone marrow biopsy with the above workup is inconclusive; however I would prefer not to do a bone marrow unless absolutely needed.  #I discussed the role of phlebotomy in bringing the hematocrit/hemoglobin down-avoid any thromboembolic events.  The role of phlebotomy in secondary erythrocytosis is unclear/controversial.  However, phlebotomy is a standard of care [goal hematocrit less than 45] for polycythemia vera.  Phlebotomy will be recommended only if patient is actively polycythemia vera.   Thank you Dr.Sonnenberg for allowing me to participate in the care of your pleasant patient. Please do not hesitate to contact me with questions or concerns in the interim.  # DISPOSITION: # labs- ordered today # follow up in 2-3 weeks- MD; no labs- Dr.B

## 2022-06-01 NOTE — Progress Notes (Signed)
[facility] NOTE  Patient Care Team: Leone Haven, MD as PCP - General (Family Medicine) Michael Boston, MD as Consulting Physician (General Surgery) Pyrtle, Lajuan Lines, MD as Consulting Physician (Gastroenterology)  CHIEF COMPLAINTS/PURPOSE OF CONSULTATION: ERYTHROCYTOSIS   HEMATOLOGY HISTORY  # ERYTHROCYTOSIS  [Hb; WBC; platelets]  HISTORY OF PRESENTING ILLNESS: Ambulating independently.  Accompanied by mother. Nicole Bryant 52 y.o.  female pleasant patient was been referred to [place] for further evaluation of erythrocytosis incidentally noted on routine blood work.  Patient denies any unusual fatigue or tingling or numbness in extremities.  Denies any nausea vomiting.  Denies any early satiety weight loss or night sweats.  Smoking: never smoked.  Lung Disease/COPD: none OSA/CPAP: none Hx of DVT/PE or Stroke: none   Review of Systems  Constitutional:  Negative for chills, diaphoresis, fever, malaise/fatigue and weight loss.  HENT:  Negative for nosebleeds and sore throat.   Eyes:  Negative for double vision.  Respiratory:  Negative for cough, hemoptysis, sputum production, shortness of breath and wheezing.   Cardiovascular:  Negative for chest pain, palpitations, orthopnea and leg swelling.  Gastrointestinal:  Negative for abdominal pain, blood in stool, constipation, diarrhea, heartburn, melena, nausea and vomiting.  Genitourinary:  Negative for dysuria, frequency and urgency.  Musculoskeletal:  Negative for back pain and joint pain.  Skin: Negative.  Negative for itching and rash.  Neurological:  Negative for dizziness, tingling, focal weakness, weakness and headaches.  Endo/Heme/Allergies:  Does not bruise/bleed easily.  Psychiatric/Behavioral:  Negative for depression. The patient is not nervous/anxious and does not have insomnia.      MEDICAL HISTORY:  Past Medical History:  Diagnosis Date   Allergic rhinitis    Allergy    Anemia     Asthma    Chickenpox    Chronic cholecystitis without calculus s/p lap cholecystectomy 02/04/2016 02/04/2016   COVID-19 virus infection    Diabetes ([place])    currently no meds- trying to control with diet   Fatty liver    Gastric polyps    GERD (gastroesophageal reflux disease)    Guaiac positive stools 08/03/2015   Hyperlipemia    Hypertension    Low iron    Norovirus 07/2015   Parathyroid abnormality ([facility])    Raynaud disease    Tubular adenoma of colon    UTI (lower urinary tract infection)     SURGICAL HISTORY: Past Surgical History:  Procedure Laterality Date   BREAST CYST EXCISION     CHOLECYSTECTOMY     COLONOSCOPY     LAPAROSCOPIC CHOLECYSTECTOMY SINGLE SITE WITH INTRAOPERATIVE CHOLANGIOGRAM N/A 02/04/2016   Procedure: LAPAROSCOPIC CHOLECYSTECTOMY SINGLE SITE WITH INTRAOPERATIVE CHOLANGIOGRAM AND STUDY OF BILE DUCTS;  Surgeon: Michael Boston, MD;  Location: [facility];  Service: General;  Laterality: N/A;   PARATHYROIDECTOMY  05/17/2007   POLYPECTOMY     TUBAL LIGATION  05/16/2006    SOCIAL HISTORY: Social History   Socioeconomic History   Marital status: Single    Spouse name: Not on file   Number of children: Not on file   Years of education: Not on file   Highest education level: Not on file  Occupational History   Occupation: data entry  Tobacco Use   Smoking status: Never   Smokeless tobacco: Never  Vaping Use   Vaping Use: Never used  Substance and Sexual Activity   Alcohol use: No    Alcohol/week: 0.0 standard drinks of alcohol   Drug use: No   Sexual activity: Not  on file  Other Topics Concern   Not on file  Social History Narrative   Lives in [place]; 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    Social Determinants of Health   Financial Resource Strain: Not on file  Food Insecurity: Not on file  Transportation Needs: Not on file  Physical Activity: Not on file  Stress: Not on file  Social Connections: Not on file  Intimate  Partner Violence: Not on file    FAMILY HISTORY: Family History  Problem Relation Age of Onset   Diverticulosis Mother    Stroke Father    Colon polyps Maternal Aunt    GER disease Maternal Aunt    Cancer - Lung Maternal Aunt    Hypertension Paternal Aunt    Diabetes Paternal Aunt    Heart disease Maternal Grandfather    Heart disease Paternal Grandmother    Cancer Cousin        Peritoneal carcinoma   Prostate cancer Other        great uncle   Colon cancer Neg Hx    Esophageal cancer Neg Hx    Stomach cancer Neg Hx    Rectal cancer Neg Hx     ALLERGIES:  is allergic to doxycycline, codeine, diphenoxylate-atropine, and sulfa antibiotics.  MEDICATIONS:  Current Outpatient Medications  Medication Sig Dispense Refill   amLODipine (NORVASC) 10 MG tablet TAKE 1 TABLET BY MOUTH ONCE DAILY 90 tablet 3   Blood Glucose Monitoring Suppl (RELION CONFIRM GLUCOSE MONITOR) w/Device KIT Check your blood glucose once daily before breakfast 1 kit 0   cholecalciferol (VITAMIN D) 1000 units tablet Take 5,000 Units by mouth daily.     CONTOUR NEXT TEST test strip USE TO CHECK BLOOD SUGAR ONCE DAILY BEFORE BREAKFAST 100 each 12   fluticasone (FLONASE) 50 MCG/ACT nasal spray USE 2 SPRAYS IN EACH NOSTRIL ONCE DAILY 16 g 2   JARDIANCE 25 MG TABS tablet TAKE 1 TABLET BY MOUTH ONCE A DAY 90 tablet 1   Lancet Devices (ONE TOUCH DELICA LANCING DEV) MISC   0   lisinopril (ZESTRIL) 5 MG tablet TAKE 1 TABLET BY MOUTH ONCE DAILY 90 tablet 1   metFORMIN (GLUCOPHAGE) 500 MG tablet TAKE 2 TABLETS BY MOUTH TWICE DAILY WITHA MEAL 360 tablet 1   MICROLET LANCETS MISC CHECK YOUR BLOOD SUGAR ONCE DAILY BEFOREBREAKFAST 100 each 1   omeprazole (PRILOSEC) 40 MG capsule TAKE 1 CAPSULE BY MOUTH ONCE DAILY 30 capsule 3   ondansetron (ZOFRAN ODT) 8 MG disintegrating tablet Take 1 tablet (8 mg total) by mouth every 8 (eight) hours as needed for nausea or vomiting. 20 tablet 0   PROAIR HFA 108 (90 Base) MCG/ACT inhaler  INHALE 2 PUFFS INTO THE LUNGS EVERY 6 HOURS AS NEEDED FOR WHEEZING OR SHORTNESS OF BREATH 8.5 g 0   triamcinolone cream (KENALOG) 0.1 % APPLY TOPICALLY 2 TIMES DAILY 30 g 0   FEROSUL 325 (65 Fe) MG tablet TAKE 1 TABLET BY MOUTH TWICE A DAY WITH MEAL. TAKE WITH VITAMIN CAPSULE (Patient not taking: Reported on 06/01/2022) 30 tablet 1   fexofenadine (ALLEGRA ALLERGY) 180 MG tablet      Loratadine (CLARITIN) 10 MG CAPS Take 10 mg by mouth daily.     No current facility-administered medications for this visit.     Marland Kitchen  PHYSICAL EXAMINATION:   Vitals:   06/01/22 1400  BP: (!) 146/92  Pulse: 96  Resp: 18  Temp: (!) 97.4 F (36.3 C)  Filed Weights   06/01/22 1400  Weight: 181 lb 9.6 oz (82.4 kg)    Physical Exam Vitals and nursing note reviewed.  HENT:     Head: Normocephalic and atraumatic.     Mouth/Throat:     Pharynx: Oropharynx is clear.  Eyes:     Extraocular Movements: Extraocular movements intact.     Pupils: Pupils are equal, round, and reactive to light.  Cardiovascular:     Rate and Rhythm: Normal rate and regular rhythm.  Pulmonary:     Comments: Decreased breath sounds bilaterally.  Abdominal:     Palpations: Abdomen is soft.  Musculoskeletal:        General: Normal range of motion.     Cervical back: Normal range of motion.  Skin:    General: Skin is warm.  Neurological:     General: No focal deficit present.     Mental Status: She is alert and oriented to person, place, and time.  Psychiatric:        Behavior: Behavior normal.        Judgment: Judgment normal.      LABORATORY DATA:  I have reviewed the data as listed Lab Results  Component Value Date   WBC 7.5 06/01/2022   HGB 15.5 (H) 06/01/2022   HCT 47.9 (H) 06/01/2022   MCV 93.4 06/01/2022   PLT 243 06/01/2022   Recent Labs    07/19/21 1439 01/04/22 1015 06/01/22 1507  NA 135 137 137  K 4.1 3.7 3.8  CL 99 101 103  CO2 '23 27 24  '$ GLUCOSE 222* 118* 164*  BUN '12 10 12  '$ CREATININE 0.68  0.70 0.79  CALCIUM 9.9 9.5 8.9  GFRNONAA  --   --  >60  PROT 7.5  --  8.0  ALBUMIN 4.9  --  4.6  AST 20  --  19  ALT 31  --  21  ALKPHOS 81  --  60  BILITOT 1.0  --  0.5     No results found.  ASSESSMENT & PLAN:   Erythrocytosis #Erythrocytosis--asymptomatic.   I had a long discussion the patient regarding the potential causes of her abnormal blood counts-both primary bone marrow problem versus reactive/secondary causes.  For now I  recommend checking CBC;CMP jak 2; LDH.  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  Also discussed regarding bone marrow biopsy with the above workup is inconclusive; however I would prefer not to do a bone marrow unless absolutely needed.  #I discussed the role of phlebotomy in bringing the hematocrit/hemoglobin down-avoid any thromboembolic events.  The role of phlebotomy in secondary erythrocytosis is unclear/controversial.  However, phlebotomy is a standard of care [goal hematocrit less than 45] for polycythemia vera.  Phlebotomy will be recommended only if patient is actively polycythemia vera.   Thank you Dr.Sonnenberg for allowing me to participate in the care of your pleasant patient. Please do not hesitate to contact me with questions or concerns in the interim.  # DISPOSITION: # labs- ordered today # follow up in 2-3 weeks- MD; no labs- Dr.B    All questions were answered. The patient knows to call the clinic with any problems, questions or concerns.  Thank you Dr. for allowing me to participate in the care of your pleasant patient. Please do not hesitate to contact me with questions or concerns in the interim.   Cammie Sickle, MD 06/01/2022 4:39 PM   # 45 minutes face-to-face with the patient discussing the above plan of care; more than 50%  of time spent on counseling and coordination.

## 2022-06-09 LAB — JAK2 GENOTYPR

## 2022-06-13 ENCOUNTER — Other Ambulatory Visit: Payer: Self-pay | Admitting: Family Medicine

## 2022-06-14 ENCOUNTER — Other Ambulatory Visit: Payer: Self-pay

## 2022-06-14 MED ORDER — METFORMIN HCL 500 MG PO TABS: ORAL_TABLET | ORAL | 1 refills | Status: DC

## 2022-06-15 ENCOUNTER — Inpatient Hospital Stay: Payer: BC Managed Care – PPO | Admitting: Internal Medicine

## 2022-06-15 ENCOUNTER — Encounter: Payer: Self-pay | Admitting: Internal Medicine

## 2022-06-15 DIAGNOSIS — D751 Secondary polycythemia: Secondary | ICD-10-CM

## 2022-06-15 NOTE — Progress Notes (Signed)
Pt here for follow up on all of her blood results. Feeling well.Appetite is good. Energy is normal.

## 2022-06-15 NOTE — Assessment & Plan Note (Addendum)
#  Erythrocytosis--asymptomatic-mildly elevated hematocrit at 47.9. [JAN 2024]-JAK2 negative.  Clinically suggestive of secondary erythrocytosis.  Recommend checking for exam 12; erythropoietin level.  Hold off bone marrow biopsy.  # Discussed regarding sleep study rule out OSA causing erythrocytosis.  Defer to PCP for referral.  # DISPOSITION: # follow up in 4 months- MD; labs- cbc/bmp; Jak-2 exon 12/epo-  Dr.B

## 2022-06-15 NOTE — Progress Notes (Signed)
[facility] NOTE  Patient Care Team: Leone Haven, MD as PCP - General (Family Medicine) Michael Boston, MD as Consulting Physician (General Surgery) Pyrtle, Lajuan Lines, MD as Consulting Physician (Gastroenterology)  CHIEF COMPLAINTS/PURPOSE OF CONSULTATION: ERYTHROCYTOSIS   HEMATOLOGY HISTORY  # ERYTHROCYTOSIS  [Hb; WBC; platelets]  HISTORY OF PRESENTING ILLNESS: Ambulating independently.  Accompanied by mother.  Nicole Bryant 52 y.o.  female pleasant patient is here for follow-up to review the results of the blood work ordered at last visit for elevated hematocrit.  Patient denies any symptoms.   Review of Systems  Constitutional:  Negative for chills, diaphoresis, fever, malaise/fatigue and weight loss.  HENT:  Negative for nosebleeds and sore throat.   Eyes:  Negative for double vision.  Respiratory:  Negative for cough, hemoptysis, sputum production, shortness of breath and wheezing.   Cardiovascular:  Negative for chest pain, palpitations, orthopnea and leg swelling.  Gastrointestinal:  Negative for abdominal pain, blood in stool, constipation, diarrhea, heartburn, melena, nausea and vomiting.  Genitourinary:  Negative for dysuria, frequency and urgency.  Musculoskeletal:  Negative for back pain and joint pain.  Skin: Negative.  Negative for itching and rash.  Neurological:  Negative for dizziness, tingling, focal weakness, weakness and headaches.  Endo/Heme/Allergies:  Does not bruise/bleed easily.  Psychiatric/Behavioral:  Negative for depression. The patient is not nervous/anxious and does not have insomnia.      MEDICAL HISTORY:  Past Medical History:  Diagnosis Date   Allergic rhinitis    Allergy    Anemia    Asthma    Chickenpox    Chronic cholecystitis without calculus s/p lap cholecystectomy 02/04/2016 02/04/2016   COVID-19 virus infection    Diabetes ([place])    currently no meds- trying to control with diet   Fatty liver     Gastric polyps    GERD (gastroesophageal reflux disease)    Guaiac positive stools 08/03/2015   Hyperlipemia    Hypertension    Low iron    Norovirus 07/2015   Parathyroid abnormality ([facility])    Raynaud disease    Tubular adenoma of colon    UTI (lower urinary tract infection)     SURGICAL HISTORY: Past Surgical History:  Procedure Laterality Date   BREAST CYST EXCISION     CHOLECYSTECTOMY     COLONOSCOPY     LAPAROSCOPIC CHOLECYSTECTOMY SINGLE SITE WITH INTRAOPERATIVE CHOLANGIOGRAM N/A 02/04/2016   Procedure: LAPAROSCOPIC CHOLECYSTECTOMY SINGLE SITE WITH INTRAOPERATIVE CHOLANGIOGRAM AND STUDY OF BILE DUCTS;  Surgeon: Michael Boston, MD;  Location: [facility];  Service: General;  Laterality: N/A;   PARATHYROIDECTOMY  05/17/2007   POLYPECTOMY     TUBAL LIGATION  05/16/2006    SOCIAL HISTORY: Social History   Socioeconomic History   Marital status: Single    Spouse name: Not on file   Number of children: Not on file   Years of education: Not on file   Highest education level: Not on file  Occupational History   Occupation: data entry  Tobacco Use   Smoking status: Never   Smokeless tobacco: Never  Vaping Use   Vaping Use: Never used  Substance and Sexual Activity   Alcohol use: No    Alcohol/week: 0.0 standard drinks of alcohol   Drug use: No   Sexual activity: Not on file  Other Topics Concern   Not on file  Social History Narrative   Lives in [place]; 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    Social  Determinants of Health   Financial Resource Strain: Not on file  Food Insecurity: Not on file  Transportation Needs: Not on file  Physical Activity: Not on file  Stress: Not on file  Social Connections: Not on file  Intimate Partner Violence: Not on file    FAMILY HISTORY: Family History  Problem Relation Age of Onset   Diverticulosis Mother    Stroke Father    Colon polyps Maternal Aunt    GER disease Maternal Aunt    Cancer - Lung  Maternal Aunt    Hypertension Paternal Aunt    Diabetes Paternal Aunt    Heart disease Maternal Grandfather    Heart disease Paternal Grandmother    Cancer Cousin        Peritoneal carcinoma   Prostate cancer Other        great uncle   Colon cancer Neg Hx    Esophageal cancer Neg Hx    Stomach cancer Neg Hx    Rectal cancer Neg Hx     ALLERGIES:  is allergic to doxycycline, codeine, diphenoxylate-atropine, and sulfa antibiotics.  MEDICATIONS:  Current Outpatient Medications  Medication Sig Dispense Refill   amLODipine (NORVASC) 10 MG tablet TAKE 1 TABLET BY MOUTH ONCE DAILY 90 tablet 3   Blood Glucose Monitoring Suppl (RELION CONFIRM GLUCOSE MONITOR) w/Device KIT Check your blood glucose once daily before breakfast 1 kit 0   cholecalciferol (VITAMIN D) 1000 units tablet Take 5,000 Units by mouth daily.     CONTOUR NEXT TEST test strip USE TO CHECK BLOOD SUGAR ONCE DAILY BEFORE BREAKFAST 100 each 12   fluticasone (FLONASE) 50 MCG/ACT nasal spray USE 2 SPRAYS IN EACH NOSTRIL ONCE DAILY 16 g 2   JARDIANCE 25 MG TABS tablet TAKE 1 TABLET BY MOUTH ONCE A DAY 90 tablet 1   Lancet Devices (ONE TOUCH DELICA LANCING DEV) MISC   0   lisinopril (ZESTRIL) 5 MG tablet TAKE 1 TABLET BY MOUTH ONCE DAILY 90 tablet 1   Loratadine (CLARITIN) 10 MG CAPS Take 10 mg by mouth daily.     metFORMIN (GLUCOPHAGE) 500 MG tablet TAKE 2 TABLETS BY MOUTH TWICE DAILY WITHA MEAL 360 tablet 1   MICROLET LANCETS MISC CHECK YOUR BLOOD SUGAR ONCE DAILY BEFOREBREAKFAST 100 each 1   omeprazole (PRILOSEC) 40 MG capsule TAKE 1 CAPSULE BY MOUTH ONCE DAILY 30 capsule 3   FEROSUL 325 (65 Fe) MG tablet TAKE 1 TABLET BY MOUTH TWICE A DAY WITH MEAL. TAKE WITH VITAMIN CAPSULE (Patient not taking: Reported on 06/01/2022) 30 tablet 1   ondansetron (ZOFRAN ODT) 8 MG disintegrating tablet Take 1 tablet (8 mg total) by mouth every 8 (eight) hours as needed for nausea or vomiting. (Patient not taking: Reported on 06/15/2022) 20 tablet 0    PROAIR HFA 108 (90 Base) MCG/ACT inhaler INHALE 2 PUFFS INTO THE LUNGS EVERY 6 HOURS AS NEEDED FOR WHEEZING OR SHORTNESS OF BREATH (Patient not taking: Reported on 06/15/2022) 8.5 g 0   triamcinolone cream (KENALOG) 0.1 % APPLY TOPICALLY 2 TIMES DAILY (Patient not taking: Reported on 06/15/2022) 30 g 0   No current facility-administered medications for this visit.     Marland Kitchen  PHYSICAL EXAMINATION:   There were no vitals filed for this visit.  There were no vitals filed for this visit.   Physical Exam Vitals and nursing note reviewed.  HENT:     Head: Normocephalic and atraumatic.     Mouth/Throat:     Pharynx: Oropharynx is clear.  Eyes:     Extraocular Movements: Extraocular movements intact.     Pupils: Pupils are equal, round, and reactive to light.  Cardiovascular:     Rate and Rhythm: Normal rate and regular rhythm.  Pulmonary:     Comments: Decreased breath sounds bilaterally.  Abdominal:     Palpations: Abdomen is soft.  Musculoskeletal:        General: Normal range of motion.     Cervical back: Normal range of motion.  Skin:    General: Skin is warm.  Neurological:     General: No focal deficit present.     Mental Status: She is alert and oriented to person, place, and time.  Psychiatric:        Behavior: Behavior normal.        Judgment: Judgment normal.      LABORATORY DATA:  I have reviewed the data as listed Lab Results  Component Value Date   WBC 7.5 06/01/2022   HGB 15.5 (H) 06/01/2022   HCT 47.9 (H) 06/01/2022   MCV 93.4 06/01/2022   PLT 243 06/01/2022   Recent Labs    07/19/21 1439 01/04/22 1015 06/01/22 1507  NA 135 137 137  K 4.1 3.7 3.8  CL 99 101 103  CO2 '23 27 24  '$ GLUCOSE 222* 118* 164*  BUN '12 10 12  '$ CREATININE 0.68 0.70 0.79  CALCIUM 9.9 9.5 8.9  GFRNONAA  --   --  >60  PROT 7.5  --  8.0  ALBUMIN 4.9  --  4.6  AST 20  --  19  ALT 31  --  21  ALKPHOS 81  --  60  BILITOT 1.0  --  0.5     No results found.  ASSESSMENT &  PLAN:   Erythrocytosis #Erythrocytosis--asymptomatic-mildly elevated hematocrit at 47.9. [JAN 2024]-JAK2 negative.  Clinically suggestive of secondary erythrocytosis.  Recommend checking for exam 12; erythropoietin level.  Hold off bone marrow biopsy.  # Discussed regarding sleep study rule out OSA causing erythrocytosis.  Defer to PCP for referral.  # DISPOSITION: # follow up in 4 months- MD; labs- cbc/bmp; Jak-2 exon 12/epo-  Dr.B    All questions were answered. The patient knows to call the clinic with any problems, questions or concerns.  Thank you Dr. for allowing me to participate in the care of your pleasant patient. Please do not hesitate to contact me with questions or concerns in the interim.   Cammie Sickle, MD 06/15/2022 3:40 PM   # 45 minutes face-to-face with the patient discussing the above plan of care; more than 50% of time spent on counseling and coordination.

## 2022-06-18 ENCOUNTER — Telehealth: Payer: Self-pay

## 2022-06-20 NOTE — Telephone Encounter (Signed)
Noted. Is she ok switching to something else?

## 2022-06-20 NOTE — Telephone Encounter (Signed)
Pt call in returning [place] message. Note below was read to her. As per pt, she has 5-10 days left of Omeprazole.

## 2022-06-20 NOTE — Telephone Encounter (Signed)
Please let the patient know that her insurance denied her omeprazole. Is she currently taking this? How much does she have left?

## 2022-06-20 NOTE — Telephone Encounter (Signed)
LVM for patient to call back.   Deazia Lampi,cma  

## 2022-06-21 MED ORDER — PANTOPRAZOLE SODIUM 40 MG PO TBEC: 40.0000 mg | DELAYED_RELEASE_TABLET | Freq: Every day | ORAL | 3 refills | Status: DC

## 2022-06-21 NOTE — Telephone Encounter (Signed)
Protonix sent to pharmacy.

## 2022-06-21 NOTE — Addendum Note (Signed)
Addended by: Leone Haven on: 06/21/2022 09:14 AM   Modules accepted: Orders

## 2022-06-21 NOTE — Telephone Encounter (Signed)
I called the patient and asked her if she was okay with switching from Omeprazole to something else and she stated she would be fine with the switch.  Bernisha Verma,cma

## 2022-06-27 ENCOUNTER — Encounter: Payer: Self-pay | Admitting: Family Medicine

## 2022-06-28 NOTE — Telephone Encounter (Signed)
PA pantoprazole

## 2022-07-06 LAB — HM DIABETES EYE EXAM

## 2022-07-07 ENCOUNTER — Telehealth: Payer: Self-pay

## 2022-07-07 ENCOUNTER — Other Ambulatory Visit (HOSPITAL_COMMUNITY): Payer: Self-pay

## 2022-07-07 NOTE — Telephone Encounter (Signed)
Patient Advocate Encounter  Prior authorization is required for Pantoprazole Sodium 40MG dr tablets. PA submitted and APPROVED on 07/07/22.  Key [id number] Effective: 07/07/22 - 07/06/23

## 2022-07-08 ENCOUNTER — Encounter: Payer: Self-pay | Admitting: Family Medicine

## 2022-07-08 ENCOUNTER — Ambulatory Visit: Payer: BC Managed Care – PPO | Admitting: Family Medicine

## 2022-07-08 VITALS — BP 126/80 | HR 92 | Temp 97.9°F | Ht 64.0 in | Wt 178.2 lb

## 2022-07-08 DIAGNOSIS — L0292 Furuncle, unspecified: Secondary | ICD-10-CM | POA: Insufficient documentation

## 2022-07-08 DIAGNOSIS — E1165 Type 2 diabetes mellitus with hyperglycemia: Secondary | ICD-10-CM | POA: Diagnosis not present

## 2022-07-08 DIAGNOSIS — E785 Hyperlipidemia, unspecified: Secondary | ICD-10-CM

## 2022-07-08 DIAGNOSIS — K219 Gastro-esophageal reflux disease without esophagitis: Secondary | ICD-10-CM | POA: Diagnosis not present

## 2022-07-08 DIAGNOSIS — Z1231 Encounter for screening mammogram for malignant neoplasm of breast: Secondary | ICD-10-CM

## 2022-07-08 DIAGNOSIS — I10 Essential (primary) hypertension: Secondary | ICD-10-CM

## 2022-07-08 LAB — LIPID PANEL
Cholesterol: 213 mg/dL — ABNORMAL HIGH (ref 0–200)
HDL: 49.1 mg/dL (ref >39.00)
NonHDL: 163.53
Total CHOL/HDL Ratio: 4
Triglycerides: 214 mg/dL — ABNORMAL HIGH (ref 0.0–149.0)
VLDL: 42.8 mg/dL — ABNORMAL HIGH (ref 0.0–40.0)

## 2022-07-08 LAB — LDL CHOLESTEROL, DIRECT: Direct LDL: 146 mg/dL

## 2022-07-08 LAB — POCT GLYCOSYLATED HEMOGLOBIN (HGB A1C): Hemoglobin A1C: 6 % — AB (ref 4.0–5.6)

## 2022-07-08 MED ORDER — CLINDAMYCIN HCL 150 MG PO CAPS: 450.0000 mg | ORAL_CAPSULE | Freq: Three times a day (TID) | ORAL | 0 refills | Status: AC

## 2022-07-08 NOTE — Assessment & Plan Note (Signed)
Chronic issue.  Previously well-controlled.  Diastolic is slightly above goal today.  Discussed checking blood pressure consistently and a goal of less than 130/80.  If it is running above that she will let us know.  She will continue amlodipine 10 mg daily and lisinopril 5 mg daily.

## 2022-07-08 NOTE — Assessment & Plan Note (Signed)
Check lipid panel  

## 2022-07-08 NOTE — Assessment & Plan Note (Signed)
Chronic issue.  Extremely well-controlled.  She will continue Jardiance 25 mg daily and metformin 1000 mg twice daily.

## 2022-07-08 NOTE — Patient Instructions (Addendum)
Nice to see you. We are going to treat your boils with clindamycin.  There is some risk of diarrhea and C. difficile with this.  If you start to have diarrhea while taking this please let us know. Please call to schedule your mammogram.  The phone number is [phone]. Will contact you with your lab results. Please start checking your blood pressure on a daily basis.  If your numbers are greater than 130/80 please let me know.

## 2022-07-08 NOTE — Assessment & Plan Note (Addendum)
Patient with recurrent boils over the last month or so.  We will treat with clindamycin 450 mg 3 times daily for 7 days.  Discussed the risk of c diff and diarrhea with this.  If she gets diarrhea she will let us know.  I am using clindamycin given her adverse reactions to Bactrim and doxycycline in the past.  Advised if she has worsening symptoms she should seek medical attention.  Discussed stopping shaving the area at least for a month or so to let things heal.  If she has recurrence in the future she will need to stop shaving altogether.

## 2022-07-08 NOTE — Progress Notes (Signed)
Nicole Rumps, MD Phone: [phone]  Nicole Bryant is a 52 y.o. female who presents today for f/u.  DIABETES Disease Monitoring: Blood Sugar ranges-30 day avg 135, fasting 123 Polyuria/phagia/dipsia- no      Optho- UTD Medications: Compliance- taking jardiance, metformin Hypoglycemic symptoms- no  HYPERTENSION Disease Monitoring Home BP Monitoring not checking Chest pain- no    Dyspnea- no Medications Compliance-  taking amlodipine, lisinopril  Edema- no BMET    Component Value Date/Time   NA 137 06/01/2022 1507   K 3.8 06/01/2022 1507   CL 103 06/01/2022 1507   CO2 24 06/01/2022 1507   GLUCOSE 164 (H) 06/01/2022 1507   BUN 12 06/01/2022 1507   CREATININE 0.79 06/01/2022 1507   CREATININE 0.80 02/22/2019 1641   CALCIUM 8.9 06/01/2022 1507   GFRNONAA >60 06/01/2022 1507   GFRAA >60 06/21/2017 2001   GERD:   Reflux symptoms: generally no   Abd pain: no   Blood in stool: no  Dysphagia: no   EGD: 2017 with gastric polyps Medication: protonix  Boils: Patient notes over the last month or so she has had boils in her left groin.  They come and go.  She notes that get worse and eventually opened up and drained.  She does shave in that area.    Social History   Tobacco Use  Smoking Status Never  Smokeless Tobacco Never    Current Outpatient Medications on File Prior to Visit  Medication Sig Dispense Refill   amLODipine (NORVASC) 10 MG tablet TAKE 1 TABLET BY MOUTH ONCE DAILY 90 tablet 3   Blood Glucose Monitoring Suppl (RELION CONFIRM GLUCOSE MONITOR) w/Device KIT Check your blood glucose once daily before breakfast 1 kit 0   cholecalciferol (VITAMIN D) 1000 units tablet Take 5,000 Units by mouth daily.     CONTOUR NEXT TEST test strip USE TO CHECK BLOOD SUGAR ONCE DAILY BEFORE BREAKFAST 100 each 12   FEROSUL 325 (65 Fe) MG tablet TAKE 1 TABLET BY MOUTH TWICE A DAY WITH MEAL. TAKE WITH VITAMIN CAPSULE 30 tablet 1   fluticasone (FLONASE) 50 MCG/ACT nasal  spray USE 2 SPRAYS IN EACH NOSTRIL ONCE DAILY 16 g 2   JARDIANCE 25 MG TABS tablet TAKE 1 TABLET BY MOUTH ONCE A DAY 90 tablet 1   Lancet Devices (ONE TOUCH DELICA LANCING DEV) MISC   0   lisinopril (ZESTRIL) 5 MG tablet TAKE 1 TABLET BY MOUTH ONCE DAILY 90 tablet 1   Loratadine (CLARITIN) 10 MG CAPS Take 10 mg by mouth daily.     metFORMIN (GLUCOPHAGE) 500 MG tablet TAKE 2 TABLETS BY MOUTH TWICE DAILY WITHA MEAL 360 tablet 1   MICROLET LANCETS MISC CHECK YOUR BLOOD SUGAR ONCE DAILY BEFOREBREAKFAST 100 each 1   ondansetron (ZOFRAN ODT) 8 MG disintegrating tablet Take 1 tablet (8 mg total) by mouth every 8 (eight) hours as needed for nausea or vomiting. 20 tablet 0   pantoprazole (PROTONIX) 40 MG tablet Take 1 tablet (40 mg total) by mouth daily. Take 30 minutes prior to first meal of the day. 30 tablet 3   PROAIR HFA 108 (90 Base) MCG/ACT inhaler INHALE 2 PUFFS INTO THE LUNGS EVERY 6 HOURS AS NEEDED FOR WHEEZING OR SHORTNESS OF BREATH 8.5 g 0   triamcinolone cream (KENALOG) 0.1 % APPLY TOPICALLY 2 TIMES DAILY 30 g 0   No current facility-administered medications on file prior to visit.     ROS see history of present illness  Objective  Physical  Exam Vitals:   07/08/22 0814 07/08/22 0823  BP: 132/84 126/80  Pulse: 92   Temp: 97.9 F (36.6 C)   SpO2: 99%     BP Readings from Last 3 Encounters:  07/08/22 126/80  06/01/22 (!) 146/92  01/04/22 130/70   Wt Readings from Last 3 Encounters:  07/08/22 178 lb 3.2 oz (80.8 kg)  06/01/22 181 lb 9.6 oz (82.4 kg)  01/04/22 175 lb 3.2 oz (79.5 kg)    Physical Exam Constitutional:      General: She is not in acute distress.    Appearance: She is not diaphoretic.  Cardiovascular:     Rate and Rhythm: Normal rate and regular rhythm.     Heart sounds: Normal heart sounds.  Pulmonary:     Effort: Pulmonary effort is normal.     Breath sounds: Normal breath sounds.  Genitourinary:    Comments: Jeralyn Bennett, CMA served as  chaperone, left suprapubic area with 2 apparent boils with no areas of fluctuance, there is some tenderness and overlying erythema Musculoskeletal:     Right lower leg: No edema.     Left lower leg: No edema.  Skin:    General: Skin is warm and dry.  Neurological:     Mental Status: She is alert.      Assessment/Plan: Please see individual problem list.  Type 2 diabetes mellitus with hyperglycemia, without long-term current use of insulin ([place]) Assessment & Plan: Chronic issue.  Extremely well-controlled.  She will continue Jardiance 25 mg daily and metformin 1000 mg twice daily.  Orders: -     POCT glycosylated hemoglobin (Hb A1C)  Primary hypertension Assessment & Plan: Chronic issue.  Previously well-controlled.  Diastolic is slightly above goal today.  Discussed checking blood pressure consistently and a goal of less than 130/80.  If it is running above that she will let us know.  She will continue amlodipine 10 mg daily and lisinopril 5 mg daily.   Hyperlipidemia, unspecified hyperlipidemia type Assessment & Plan: Check lipid panel.    Orders: -     Lipid panel  Gastroesophageal reflux disease without esophagitis Assessment & Plan: Adequately controlled.  Patient will continue Protonix 40 mg daily.   Boils Assessment & Plan: Patient with recurrent boils over the last month or so.  We will treat with clindamycin 450 mg 3 times daily for 7 days.  Discussed the risk of c diff and diarrhea with this.  If she gets diarrhea she will let us know.  I am using clindamycin given her adverse reactions to Bactrim and doxycycline in the past.  Advised if she has worsening symptoms she should seek medical attention.  Discussed stopping shaving the area at least for a month or so to let things heal.  If she has recurrence in the future she will need to stop shaving altogether.  Orders: -     Clindamycin HCl; Take 3 capsules (450 mg total) by mouth 3 (three) times daily for 7 days.   Dispense: 63 capsule; Refill: 0  Encounter for screening mammogram for malignant neoplasm of breast -     3D Screening Mammogram, Left and Right; Future     Health Maintenance: patient will call to schedule her mammogram.   Return in about 3 months (around 10/06/2022) for Hypertension.   Nicole Rumps, MD [place]

## 2022-07-08 NOTE — Assessment & Plan Note (Signed)
Adequately controlled.  Patient will continue Protonix 40 mg daily.

## 2022-07-11 ENCOUNTER — Telehealth: Payer: Self-pay

## 2022-07-11 NOTE — Telephone Encounter (Signed)
Left message to call the office back regarding Dr. Ellen Henri message

## 2022-07-11 NOTE — Telephone Encounter (Signed)
-----   Message from Leone Haven, MD sent at 07/08/2022  5:10 PM EST ----- Please contact the patient and see if she got the MyChart result message.  I think she should be started on Crestor 20 mg daily and have labs rechecked in 6 weeks with an LDL and hepatic function panel.

## 2022-07-12 ENCOUNTER — Other Ambulatory Visit: Payer: Self-pay

## 2022-07-12 DIAGNOSIS — E785 Hyperlipidemia, unspecified: Secondary | ICD-10-CM

## 2022-07-21 ENCOUNTER — Encounter: Payer: Self-pay | Admitting: Family Medicine

## 2022-07-21 ENCOUNTER — Other Ambulatory Visit: Payer: Self-pay | Admitting: Family Medicine

## 2022-07-21 MED ORDER — AMLODIPINE BESYLATE 10 MG PO TABS: 10.0000 mg | ORAL_TABLET | Freq: Every day | ORAL | 3 refills | Status: DC

## 2022-07-25 ENCOUNTER — Other Ambulatory Visit: Payer: Self-pay | Admitting: Family Medicine

## 2022-08-01 ENCOUNTER — Encounter: Payer: Self-pay | Admitting: Family Medicine

## 2022-08-03 NOTE — Telephone Encounter (Signed)
Please change her may visit to a physical.

## 2022-08-03 NOTE — Telephone Encounter (Signed)
May appointment changed to a Physical

## 2022-08-23 ENCOUNTER — Other Ambulatory Visit: Payer: Self-pay | Admitting: Family Medicine

## 2022-09-14 ENCOUNTER — Other Ambulatory Visit: Payer: Self-pay | Admitting: Family Medicine

## 2022-09-21 ENCOUNTER — Other Ambulatory Visit: Payer: Self-pay | Admitting: Family Medicine

## 2022-09-21 ENCOUNTER — Encounter: Payer: Self-pay | Admitting: Family Medicine

## 2022-10-04 ENCOUNTER — Other Ambulatory Visit: Payer: BC Managed Care – PPO

## 2022-10-07 ENCOUNTER — Ambulatory Visit: Payer: BC Managed Care – PPO | Admitting: Family Medicine

## 2022-10-18 ENCOUNTER — Ambulatory Visit: Payer: BC Managed Care – PPO | Admitting: Internal Medicine

## 2022-10-18 ENCOUNTER — Other Ambulatory Visit: Payer: BC Managed Care – PPO

## 2022-10-25 ENCOUNTER — Other Ambulatory Visit: Payer: BC Managed Care – PPO

## 2022-11-07 ENCOUNTER — Ambulatory Visit: Payer: BC Managed Care – PPO | Admitting: Family Medicine

## 2022-11-09 ENCOUNTER — Other Ambulatory Visit (INDEPENDENT_AMBULATORY_CARE_PROVIDER_SITE_OTHER): Payer: BC Managed Care – PPO

## 2022-11-09 DIAGNOSIS — E785 Hyperlipidemia, unspecified: Secondary | ICD-10-CM

## 2022-11-09 LAB — LIPID PANEL
Cholesterol: 184 mg/dL (ref 0–200)
HDL: 38.8 mg/dL — ABNORMAL LOW (ref >39.00)
NonHDL: 144.71
Total CHOL/HDL Ratio: 5
Triglycerides: 219 mg/dL — ABNORMAL HIGH (ref 0.0–149.0)
VLDL: 43.8 mg/dL — ABNORMAL HIGH (ref 0.0–40.0)

## 2022-11-09 LAB — LDL CHOLESTEROL, DIRECT: Direct LDL: 124 mg/dL

## 2022-11-10 ENCOUNTER — Other Ambulatory Visit: Payer: Self-pay | Admitting: Family Medicine

## 2022-11-10 DIAGNOSIS — I10 Essential (primary) hypertension: Secondary | ICD-10-CM

## 2022-11-21 ENCOUNTER — Ambulatory Visit: Payer: BC Managed Care – PPO | Admitting: Nurse Practitioner

## 2022-11-22 ENCOUNTER — Ambulatory Visit (INDEPENDENT_AMBULATORY_CARE_PROVIDER_SITE_OTHER): Payer: BC Managed Care – PPO | Admitting: Family Medicine

## 2022-11-22 ENCOUNTER — Encounter: Payer: Self-pay | Admitting: Family Medicine

## 2022-11-22 VITALS — BP 122/78 | HR 80 | Temp 97.8°F | Ht 64.0 in | Wt 183.8 lb

## 2022-11-22 DIAGNOSIS — E1165 Type 2 diabetes mellitus with hyperglycemia: Secondary | ICD-10-CM | POA: Diagnosis not present

## 2022-11-22 DIAGNOSIS — Z0000 Encounter for general adult medical examination without abnormal findings: Secondary | ICD-10-CM | POA: Diagnosis not present

## 2022-11-22 DIAGNOSIS — E785 Hyperlipidemia, unspecified: Secondary | ICD-10-CM

## 2022-11-22 LAB — POCT GLYCOSYLATED HEMOGLOBIN (HGB A1C): Hemoglobin A1C: 6.1 % — AB (ref 4.0–5.6)

## 2022-11-22 MED ORDER — OMEPRAZOLE 40 MG PO CPDR: 40.0000 mg | DELAYED_RELEASE_CAPSULE | Freq: Every day | ORAL | 3 refills | Status: DC

## 2022-11-22 MED ORDER — PROAIR HFA 108 (90 BASE) MCG/ACT IN AERS: 2.0000 | INHALATION_SPRAY | Freq: Four times a day (QID) | RESPIRATORY_TRACT | 0 refills | Status: DC | PRN

## 2022-11-22 NOTE — Assessment & Plan Note (Signed)
Physical exam completed.  Encouraged continued healthy diet and exercise.  Discussed trying some higher intensity exercise to see if that helps with more weight loss.  Pap smear is up-to-date.  She notes she is going to schedule follow-up with her gynecologist.  She was encouraged to schedule her mammogram.  She was encouraged to get the Shingrix vaccine.  She will check on insurance coverage for this.  She declines COVID vaccinations.  She declines hepatitis C screening.  A1c to be completed today.

## 2022-11-22 NOTE — Assessment & Plan Note (Signed)
Chronic issue.  Discussed LDL was still above goal.  Discussed starting a statin.  Discussed risk of myalgias and liver enzyme issues with this.  Discussed that we would recheck labs 6 weeks after starting a statin.  Patient is hesitant to start a statin at this time and wants to keep working on diet and exercise.  Discussed that even if she got her LDL down my recommendation would still be to go on a statin given her diabetes history.  We will follow-up in 3 months and recheck at that time.

## 2022-11-22 NOTE — Progress Notes (Signed)
[name], MD Phone: [phone]  Nicole Bryant is a 52 y.o. female who presents today for CPE.  Diet: Notes it could be better though seems to be relatively healthy, has overnight oats with flax and she has seeds or Cheerios for breakfast, snacks on yogurt and apples and not, lunch is typically leftovers, dinner is typically a meat and something else.  Does not eat a lot of vegetables though does get plenty of fruit. Exercise: Walks 5 days a week, lifts weights 3 times a week Pap smear: NILM neg HPV in 2021, notes needs to schedule with GYN Colonoscopy: 01/11/21 7 year recall Mammogram: needs to schedule, notes she got a letter to get this scheduled Family history-  Colon cancer: no  Breast cancer: no  Ovarian cancer: no Menses: LMP in February Vaccines-   Flu: out of season  Tetanus: UTD  Shingles: due  COVID19: declines HIV screening: UTD - reports had in the past Hep C Screening: due Tobacco use: no Alcohol use: no Illicit Drug use: no Dentist: yes Ophthalmology: yes   Active Ambulatory Problems    Diagnosis Date Noted   Hyperlipidemia 08/03/2015   Type 2 diabetes mellitus with hyperglycemia, without long-term current use of insulin (HCC)    Sleeping difficulty 12/29/2015   Hypertension 06/26/2017   Iron deficiency 06/26/2017   Acute right ankle pain 10/03/2017   Rash 10/03/2017   Missed period 05/02/2018   Cervical cancer screening 08/22/2019   Obesity (BMI 30.0-34.9) 08/22/2019   Asthma 12/06/2019   COVID-19 virus infection 05/27/2020   Perimenopause 07/19/2021   Eustachian tube dysfunction 07/19/2021   History of COVID-19 07/19/2021   Erythrocytosis 06/01/2022   GERD (gastroesophageal reflux disease) 07/08/2022   Boils 07/08/2022   Routine general medical examination at a health care facility 11/22/2022   Resolved Ambulatory Problems    Diagnosis Date Noted   Abdominal pain, epigastric 07/07/2015   Constipation 08/03/2015   Guaiac  positive stools 08/03/2015   Influenza with respiratory manifestation 08/10/2015   Nausea vomiting and diarrhea 09/08/2015   Dehydration 09/08/2015   Syncope 09/08/2015   Regional enteritis (HCC) 09/08/2015   Enteritis    Metabolic acidosis    Elevated blood pressure 12/29/2015   Abdominal pain, chronic, right upper quadrant 12/29/2015   Chronic cholecystitis without calculus s/p lap cholecystectomy 02/04/2016 02/04/2016   URI (upper respiratory infection) 06/26/2017   Suspected COVID-19 virus infection 03/27/2019   Past Medical History:  Diagnosis Date   Allergic rhinitis    Allergy    Anemia    Chickenpox    Diabetes (HCC)    Fatty liver    Gastric polyps    Hyperlipemia    Low iron    Norovirus 07/2015   Parathyroid abnormality (HCC)    Raynaud disease    Tubular adenoma of colon    UTI (lower urinary tract infection)     Family History  Problem Relation Age of Onset   Diverticulosis Mother    Stroke Father    Colon polyps Maternal Aunt    GER disease Maternal Aunt    Cancer - Lung Maternal Aunt    Hypertension Paternal Aunt    Diabetes Paternal Aunt    Heart disease Maternal Grandfather    Heart disease Paternal Grandmother    Cancer Cousin        Peritoneal carcinoma   Prostate cancer Other        great uncle   Colon cancer Neg Hx    Esophageal cancer Neg  Hx    Stomach cancer Neg Hx    Rectal cancer Neg Hx     Social History   Socioeconomic History   Marital status: Single    Spouse name: Not on file   Number of children: Not on file   Years of education: Not on file   Highest education level: Not on file  Occupational History   Occupation: data entry  Tobacco Use   Smoking status: Never   Smokeless tobacco: Never  Vaping Use   Vaping Use: Never used  Substance and Sexual Activity   Alcohol use: No    Alcohol/week: 0.0 standard drinks of alcohol   Drug use: No   Sexual activity: Not on file  Other Topics Concern   Not on file  Social  History Narrative   Lives in [place]; 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    Social Determinants of Health   Financial Resource Strain: Not on file  Food Insecurity: Not on file  Transportation Needs: Not on file  Physical Activity: Not on file  Stress: Not on file  Social Connections: Not on file  Intimate Partner Violence: Not on file    ROS  General:  Negative for nexplained weight loss, fever Skin: Negative for new or changing mole, sore that won't heal HEENT: Negative for trouble hearing, trouble seeing, ringing in ears, mouth sores, hoarseness, change in voice, dysphagia. CV:  Negative for chest pain, dyspnea, edema, palpitations Resp: Negative for cough, dyspnea, hemoptysis GI: Negative for nausea, vomiting, diarrhea, constipation, abdominal pain, melena, hematochezia. GU: Negative for dysuria, incontinence, urinary hesitance, hematuria, vaginal or penile discharge, polyuria, sexual difficulty, lumps in testicle or breasts MSK: Negative for muscle cramps or aches, joint pain or swelling Neuro: Negative for headaches, weakness, numbness, dizziness, passing out/fainting Psych: Negative for depression, anxiety, memory problems  Objective  Physical Exam Vitals:   11/22/22 0836  BP: 122/78  Pulse: 80  Temp: 97.8 F (36.6 C)  SpO2: 96%    BP Readings from Last 3 Encounters:  11/22/22 122/78  07/08/22 126/80  06/01/22 (!) 146/92   Wt Readings from Last 3 Encounters:  11/22/22 183 lb 12.8 oz (83.4 kg)  07/08/22 178 lb 3.2 oz (80.8 kg)  06/01/22 181 lb 9.6 oz (82.4 kg)    Physical Exam Constitutional:      General: She is not in acute distress.    Appearance: She is not diaphoretic.  HENT:     Head: Normocephalic and atraumatic.  Cardiovascular:     Rate and Rhythm: Normal rate and regular rhythm.     Heart sounds: Normal heart sounds.  Pulmonary:     Effort: Pulmonary effort is normal.     Breath sounds: Normal breath sounds.   Abdominal:     General: Bowel sounds are normal. There is no distension.     Palpations: Abdomen is soft.     Tenderness: There is no abdominal tenderness.  Musculoskeletal:     Right lower leg: No edema.     Left lower leg: No edema.  Lymphadenopathy:     Cervical: No cervical adenopathy.  Skin:    General: Skin is warm and dry.  Neurological:     Mental Status: She is alert.  Psychiatric:        Mood and Affect: Mood normal.    Diabetic Foot Exam - Simple   Simple Foot Form Diabetic Foot exam was performed with the following findings: Yes 11/22/2022  9:27 AM  Visual Inspection No deformities, no ulcerations, no other skin breakdown bilaterally: Yes Sensation Testing Intact to touch and monofilament testing bilaterally: Yes Pulse Check Posterior Tibialis and Dorsalis pulse intact bilaterally: Yes Comments      Assessment/Plan:   Routine general medical examination at a health care facility Assessment & Plan: Physical exam completed.  Encouraged continued healthy diet and exercise.  Discussed trying some higher intensity exercise to see if that helps with more weight loss.  Pap smear is up-to-date.  She notes she is going to schedule follow-up with her gynecologist.  She was encouraged to schedule her mammogram.  She was encouraged to get the Shingrix vaccine.  She will check on insurance coverage for this.  She declines COVID vaccinations.  She declines hepatitis C screening.  A1c to be completed today.   Hyperlipidemia, unspecified hyperlipidemia type Assessment & Plan: Chronic issue.  Discussed LDL was still above goal.  Discussed starting a statin.  Discussed risk of myalgias and liver enzyme issues with this.  Discussed that we would recheck labs 6 weeks after starting a statin.  Patient is hesitant to start a statin at this time and wants to keep working on diet and exercise.  Discussed that even if she got her LDL down my recommendation would still be to go on a statin  given her diabetes history.  We will follow-up in 3 months and recheck at that time.   Type 2 diabetes mellitus with hyperglycemia, without long-term current use of insulin (HCC) -     POCT glycosylated hemoglobin (Hb A1C)  Other orders -     Omeprazole; Take 1 capsule (40 mg total) by mouth daily.  Dispense: 30 capsule; Refill: 3 -     ProAir HFA; Inhale 2 puffs into the lungs every 6 (six) hours as needed for wheezing or shortness of breath.  Dispense: 8.5 g; Refill: 0    Return in about 3 months (around 02/22/2023) for HLD.   [name], MD [facility]

## 2022-11-30 ENCOUNTER — Encounter: Payer: Self-pay | Admitting: Family Medicine

## 2022-11-30 ENCOUNTER — Telehealth: Payer: BC Managed Care – PPO | Admitting: Family Medicine

## 2022-11-30 ENCOUNTER — Telehealth: Payer: Self-pay

## 2022-11-30 VITALS — Temp 97.9°F

## 2022-11-30 DIAGNOSIS — J40 Bronchitis, not specified as acute or chronic: Secondary | ICD-10-CM | POA: Diagnosis not present

## 2022-11-30 MED ORDER — PREDNISONE 20 MG PO TABS
40.0000 mg | ORAL_TABLET | Freq: Every day | ORAL | 0 refills | Status: DC
Start: 2022-11-30 — End: 2023-09-04

## 2022-11-30 MED ORDER — BENZONATATE 200 MG PO CAPS
200.0000 mg | ORAL_CAPSULE | Freq: Two times a day (BID) | ORAL | 0 refills | Status: DC | PRN
Start: 2022-11-30 — End: 2023-09-04

## 2022-11-30 MED ORDER — AMOXICILLIN-POT CLAVULANATE 875-125 MG PO TABS
1.0000 | ORAL_TABLET | Freq: Two times a day (BID) | ORAL | 0 refills | Status: DC
Start: 2022-11-30 — End: 2023-09-04

## 2022-11-30 NOTE — Telephone Encounter (Signed)
Noted. Ok for virtual visit later today. We can then determine if she needs to be seen in person somewhere.

## 2022-11-30 NOTE — Telephone Encounter (Signed)
Spoke to Patient she confirms her symptoms began with a scratchy throat on Saturday 11/26/22 then that cleared up. Now Patient has headache, chest tightness, wheezing, weakness, dry cough, and left ear pressure. Patient took a at home covid test this morning and it was Negative.

## 2022-11-30 NOTE — Progress Notes (Signed)
Virtual Visit via video Note  I connected with Nicole Bryant today at  4:30 PM EDT by a video enabled telemedicine application or telephone and verified that I am speaking with the correct person using two identifiers. Location patient: home Location provider: work Persons participating in the virtual visit: patient, provider  I discussed the limitations, risks, security and privacy concerns of performing an evaluation and management service by telephone and the availability of in person appointments. I also discussed with the patient that there may be a patient responsible charge related to this service. The patient expressed understanding and agreed to proceed.   Reason for visit: same day visit.   HPI: Cough: Patient notes onset of symptoms on 7/13.  Started with a scratchy throat and ear pressure and feeling tired.  Then she developed dry cough with wheezing and mild shortness of breath.  She has headache with this.  No postnasal drip or sore throat.  No taste or smell disturbances.  She does report some mild shortness of breath.  She does have some left ear pressure today.  No symptoms in her right ear.  She is been using her inhaler with little benefit.  She notes a negative COVID test this morning.   ROS: See pertinent positives and negatives per HPI.  Past Medical History:  Diagnosis Date   Allergic rhinitis    Allergy    Anemia    Asthma    Chickenpox    Chronic cholecystitis without calculus s/p lap cholecystectomy 02/04/2016 02/04/2016   COVID-19 virus infection    Diabetes (HCC)    currently no meds- trying to control with diet   Fatty liver    Gastric polyps    GERD (gastroesophageal reflux disease)    Guaiac positive stools 08/03/2015   Hyperlipemia    Hypertension    Low iron    Norovirus 07/2015   Parathyroid abnormality (HCC)    Raynaud disease    Tubular adenoma of colon    UTI (lower urinary tract infection)     Past Surgical History:   Procedure Laterality Date   BREAST CYST EXCISION     CHOLECYSTECTOMY     COLONOSCOPY     LAPAROSCOPIC CHOLECYSTECTOMY SINGLE SITE WITH INTRAOPERATIVE CHOLANGIOGRAM N/A 02/04/2016   Procedure: LAPAROSCOPIC CHOLECYSTECTOMY SINGLE SITE WITH INTRAOPERATIVE CHOLANGIOGRAM AND STUDY OF BILE DUCTS;  Surgeon: Karie Soda, MD;  Location: [facility];  Service: General;  Laterality: N/A;   PARATHYROIDECTOMY  05/17/2007   POLYPECTOMY     TUBAL LIGATION  05/16/2006    Family History  Problem Relation Age of Onset   Diverticulosis Mother    Stroke Father    Colon polyps Maternal Aunt    GER disease Maternal Aunt    Cancer - Lung Maternal Aunt    Hypertension Paternal Aunt    Diabetes Paternal Aunt    Heart disease Maternal Grandfather    Heart disease Paternal Grandmother    Cancer Cousin        Peritoneal carcinoma   Prostate cancer Other        great uncle   Colon cancer Neg Hx    Esophageal cancer Neg Hx    Stomach cancer Neg Hx    Rectal cancer Neg Hx     SOCIAL HX: Non-smoker   Current Outpatient Medications:    amLODipine (NORVASC) 10 MG tablet, Take 1 tablet (10 mg total) by mouth daily., Disp: 90 tablet, Rfl: 3   amoxicillin-clavulanate (AUGMENTIN) 875-125 MG tablet, Take 1 tablet  by mouth 2 (two) times daily., Disp: 14 tablet, Rfl: 0   benzonatate (TESSALON) 200 MG capsule, Take 1 capsule (200 mg total) by mouth 2 (two) times daily as needed for cough., Disp: 20 capsule, Rfl: 0   Blood Glucose Monitoring Suppl (RELION CONFIRM GLUCOSE MONITOR) w/Device KIT, Check your blood glucose once daily before breakfast, Disp: 1 kit, Rfl: 0   cholecalciferol (VITAMIN D) 1000 units tablet, Take 5,000 Units by mouth daily., Disp: , Rfl:    CONTOUR NEXT TEST test strip, USE TO CHECK BLOOD SUGAR ONCE DAILY BEFORE BREAKFAST, Disp: 100 each, Rfl: 12   FEROSUL 325 (65 Fe) MG tablet, TAKE 1 TABLET BY MOUTH TWICE A DAY WITH MEAL. TAKE WITH VITAMIN CAPSULE, Disp: 30 tablet, Rfl: 1   fluticasone  (FLONASE) 50 MCG/ACT nasal spray, USE 2 SPRAYS IN EACH NOSTRIL ONCE DAILY, Disp: 16 g, Rfl: 2   JARDIANCE 25 MG TABS tablet, TAKE ONE TABLET BY MOUTH ONCE A DAY, Disp: 90 tablet, Rfl: 1   Lancet Devices (ONE TOUCH DELICA LANCING DEV) MISC, , Disp: , Rfl: 0   lisinopril (ZESTRIL) 5 MG tablet, TAKE ONE TABLET BY MOUTH ONCE DAILY, Disp: 90 tablet, Rfl: 1   Loratadine (CLARITIN) 10 MG CAPS, Take 10 mg by mouth daily., Disp: , Rfl:    metFORMIN (GLUCOPHAGE) 500 MG tablet, TAKE TWO TABLETS BY MOUTH TWICE A DAY WITH A MEAL, Disp: 360 tablet, Rfl: 1   MICROLET LANCETS MISC, CHECK YOUR BLOOD SUGAR ONCE DAILY BEFOREBREAKFAST, Disp: 100 each, Rfl: 1   omeprazole (PRILOSEC) 40 MG capsule, Take 1 capsule (40 mg total) by mouth daily., Disp: 30 capsule, Rfl: 3   ondansetron (ZOFRAN ODT) 8 MG disintegrating tablet, Take 1 tablet (8 mg total) by mouth every 8 (eight) hours as needed for nausea or vomiting., Disp: 20 tablet, Rfl: 0   predniSONE (DELTASONE) 20 MG tablet, Take 2 tablets (40 mg total) by mouth daily with breakfast., Disp: 10 tablet, Rfl: 0   PROAIR HFA 108 (90 Base) MCG/ACT inhaler, Inhale 2 puffs into the lungs every 6 (six) hours as needed for wheezing or shortness of breath., Disp: 8.5 g, Rfl: 0   triamcinolone cream (KENALOG) 0.1 %, APPLY TOPICALLY 2 TIMES DAILY, Disp: 30 g, Rfl: 0  EXAM:  VITALS per patient if applicable:  GENERAL: alert, oriented, appears well and in no acute distress  HEENT: atraumatic, conjunttiva clear, no obvious abnormalities on inspection of external nose and ears  NECK: normal movements of the head and neck  LUNGS: on inspection no signs of respiratory distress, breathing rate appears normal, no obvious gross SOB, gasping or wheezing  CV: no obvious cyanosis  MS: moves all visible extremities without noticeable abnormality  PSYCH/NEURO: pleasant and cooperative, no obvious depression or anxiety, speech and thought processing grossly intact  ASSESSMENT AND  PLAN:  Discussed the following assessment and plan:  Problem List Items Addressed This Visit     Bronchitis - Primary    Suspect bronchitis leading to asthma exacerbation.  Discussed the left ear pressure could represent an ear infection and the limitations of a virtual visit prevent me from fully evaluating that.  We will treat with prednisone 40 mg daily and Augmentin 1 tablet twice daily for 7 days.  Discussed the risk of sleep issues and agitation with the prednisone.  Discussed the risk of diarrhea with the antibiotic.  Advised to seek medical attention for worsening symptoms, increasing shortness of breath, cough productive of blood, fevers, or symptoms that  are not improving with treatment.  Will treat cough with Tessalon.  Patient will remain out of work tomorrow as well.  Work note provided.      Relevant Medications   benzonatate (TESSALON) 200 MG capsule   amoxicillin-clavulanate (AUGMENTIN) 875-125 MG tablet   predniSONE (DELTASONE) 20 MG tablet    Return if symptoms worsen or fail to improve.   I discussed the assessment and treatment plan with the patient. The patient was provided an opportunity to ask questions and all were answered. The patient agreed with the plan and demonstrated an understanding of the instructions.   The patient was advised to call back or seek an in-person evaluation if the symptoms worsen or if the condition fails to improve as anticipated.  [name], MD

## 2022-11-30 NOTE — Assessment & Plan Note (Signed)
Suspect bronchitis leading to asthma exacerbation.  Discussed the left ear pressure could represent an ear infection and the limitations of a virtual visit prevent me from fully evaluating that.  We will treat with prednisone 40 mg daily and Augmentin 1 tablet twice daily for 7 days.  Discussed the risk of sleep issues and agitation with the prednisone.  Discussed the risk of diarrhea with the antibiotic.  Advised to seek medical attention for worsening symptoms, increasing shortness of breath, cough productive of blood, fevers, or symptoms that are not improving with treatment.  Will treat cough with Tessalon.  Patient will remain out of work tomorrow as well.  Work note provided.

## 2022-11-30 NOTE — Telephone Encounter (Signed)
 Noted  

## 2022-12-02 ENCOUNTER — Encounter: Payer: Self-pay | Admitting: Family Medicine

## 2022-12-02 MED ORDER — TRAZODONE HCL 50 MG PO TABS: 25.0000 mg | ORAL_TABLET | Freq: Every evening | ORAL | 0 refills | Status: DC | PRN

## 2022-12-05 ENCOUNTER — Telehealth: Payer: Self-pay | Admitting: Family Medicine

## 2022-12-05 ENCOUNTER — Ambulatory Visit: Payer: BC Managed Care – PPO | Admitting: Family Medicine

## 2022-12-05 ENCOUNTER — Ambulatory Visit
Admission: RE | Admit: 2022-12-05 | Discharge: 2022-12-05 | Disposition: A | Discharge status: Home / Self Care | Payer: BC Managed Care – PPO | Attending: Family Medicine | Admitting: Family Medicine

## 2022-12-05 ENCOUNTER — Ambulatory Visit
Admission: RE | Admit: 2022-12-05 | Discharge: 2022-12-05 | Disposition: A | Discharge status: Home / Self Care | Payer: BC Managed Care – PPO | Source: Ambulatory Visit | Attending: Family Medicine | Admitting: Family Medicine

## 2022-12-05 VITALS — BP 144/88 | HR 112 | Temp 97.9°F | Ht 64.0 in | Wt 182.4 lb

## 2022-12-05 DIAGNOSIS — R0602 Shortness of breath: Secondary | ICD-10-CM | POA: Diagnosis not present

## 2022-12-05 DIAGNOSIS — J40 Bronchitis, not specified as acute or chronic: Secondary | ICD-10-CM | POA: Insufficient documentation

## 2022-12-05 MED ORDER — AZITHROMYCIN 250 MG PO TABS
ORAL_TABLET | ORAL | 0 refills | Status: AC
Start: 2022-12-05 — End: 2022-12-10

## 2022-12-05 NOTE — Patient Instructions (Signed)
Nice to see you. We are going to add azithromycin.  Please complete your Augmentin. If you develop worsening shortness of breath, cough productive of blood, fevers, or any new or worsening symptoms please seek medical attention.

## 2022-12-05 NOTE — Telephone Encounter (Signed)
Noted. Patient is scheduled to come in to see me today.

## 2022-12-05 NOTE — Telephone Encounter (Signed)
Pt called stating she is still not feeling well after being seen by the provider and would like a call back

## 2022-12-05 NOTE — Telephone Encounter (Signed)
Patient states she is a little worse and has been in the bed all weekend. Patient is still very tired , not sleeping, coughing until getting sick or almost and then haivng to catch her breath. Patient is also still having chest tightness. Patient states she has taken 2 covid tests which were negative. Would you like for her to come in to be seen? You did a virtual visit with her on Wednesday.

## 2022-12-05 NOTE — Progress Notes (Signed)
Nicole Alar, MD Phone: [phone]  Nicole Bryant is a 52 y.o. female who presents today for f/u.  Respiratory illness: Patient notes no significant improvement since her visit on 11/30/2022.  She continues to have some chest tightness and wheezing and coughing.  She has some shortness of breath.  She notes the prednisone has not helped very much.  She still has several days of antibiotics to take.  Has been using an inhaler every 6 hours with some benefit.  Social History   Tobacco Use  Smoking Status Never  Smokeless Tobacco Never    Current Outpatient Medications on File Prior to Visit  Medication Sig Dispense Refill   amLODipine (NORVASC) 10 MG tablet Take 1 tablet (10 mg total) by mouth daily. 90 tablet 3   amoxicillin-clavulanate (AUGMENTIN) 875-125 MG tablet Take 1 tablet by mouth 2 (two) times daily. 14 tablet 0   benzonatate (TESSALON) 200 MG capsule Take 1 capsule (200 mg total) by mouth 2 (two) times daily as needed for cough. 20 capsule 0   Blood Glucose Monitoring Suppl (RELION CONFIRM GLUCOSE MONITOR) w/Device KIT Check your blood glucose once daily before breakfast 1 kit 0   cholecalciferol (VITAMIN D) 1000 units tablet Take 5,000 Units by mouth daily.     CONTOUR NEXT TEST test strip USE TO CHECK BLOOD SUGAR ONCE DAILY BEFORE BREAKFAST 100 each 12   FEROSUL 325 (65 Fe) MG tablet TAKE 1 TABLET BY MOUTH TWICE A DAY WITH MEAL. TAKE WITH VITAMIN CAPSULE 30 tablet 1   fluticasone (FLONASE) 50 MCG/ACT nasal spray USE 2 SPRAYS IN EACH NOSTRIL ONCE DAILY 16 g 2   JARDIANCE 25 MG TABS tablet TAKE ONE TABLET BY MOUTH ONCE A DAY 90 tablet 1   Lancet Devices (ONE TOUCH DELICA LANCING DEV) MISC   0   lisinopril (ZESTRIL) 5 MG tablet TAKE ONE TABLET BY MOUTH ONCE DAILY 90 tablet 1   Loratadine (CLARITIN) 10 MG CAPS Take 10 mg by mouth daily.     metFORMIN (GLUCOPHAGE) 500 MG tablet TAKE TWO TABLETS BY MOUTH TWICE A DAY WITH A MEAL 360 tablet 1   MICROLET LANCETS MISC  CHECK YOUR BLOOD SUGAR ONCE DAILY BEFOREBREAKFAST 100 each 1   omeprazole (PRILOSEC) 40 MG capsule Take 1 capsule (40 mg total) by mouth daily. 30 capsule 3   ondansetron (ZOFRAN ODT) 8 MG disintegrating tablet Take 1 tablet (8 mg total) by mouth every 8 (eight) hours as needed for nausea or vomiting. 20 tablet 0   predniSONE (DELTASONE) 20 MG tablet Take 2 tablets (40 mg total) by mouth daily with breakfast. 10 tablet 0   PROAIR HFA 108 (90 Base) MCG/ACT inhaler Inhale 2 puffs into the lungs every 6 (six) hours as needed for wheezing or shortness of breath. 8.5 g 0   traZODone (DESYREL) 50 MG tablet Take 0.5-1 tablets (25-50 mg total) by mouth at bedtime as needed for sleep. 10 tablet 0   triamcinolone cream (KENALOG) 0.1 % APPLY TOPICALLY 2 TIMES DAILY 30 g 0   No current facility-administered medications on file prior to visit.     ROS see history of present illness  Objective  Physical Exam Vitals:   12/05/22 1503  BP: (!) 144/88  Pulse: (!) 112  Temp: 97.9 F (36.6 C)  SpO2: 98%    BP Readings from Last 3 Encounters:  12/05/22 (!) 144/88  11/22/22 122/78  07/08/22 126/80   Wt Readings from Last 3 Encounters:  12/05/22 182 lb 6.4 oz (  82.7 kg)  11/22/22 183 lb 12.8 oz (83.4 kg)  07/08/22 178 lb 3.2 oz (80.8 kg)    Physical Exam Constitutional:      General: She is not in acute distress.    Appearance: She is not diaphoretic.  HENT:     Right Ear: Tympanic membrane normal.     Left Ear: Tympanic membrane normal.  Cardiovascular:     Rate and Rhythm: Normal rate and regular rhythm.     Heart sounds: Normal heart sounds.  Pulmonary:     Effort: Pulmonary effort is normal.     Breath sounds: Normal breath sounds.  Skin:    General: Skin is warm and dry.  Neurological:     Mental Status: She is alert.      Assessment/Plan: Please see individual problem list.  Bronchitis Assessment & Plan: Patient still could potentially have bronchitis with asthma  exacerbation though I would certainly expect the asthma component to be better with the prednisone.  We will add azithromycin 500 mg on day 1 and 250 mg on days 2 through 5.  She will continue her Augmentin.  Will get a chest x-ray today.  If she develops worsening shortness of breath, cough abdominal blood, fevers, or worsening symptoms she should be reevaluated.  Work note provided to keep her out through tomorrow.  Orders: -     DG Chest 2 View; Future -     Azithromycin; Take 2 tablets on day 1, then 1 tablet daily on days 2 through 5  Dispense: 6 tablet; Refill: 0    Return if symptoms worsen or fail to improve.   Nicole Alar, MD [facility]

## 2022-12-05 NOTE — Assessment & Plan Note (Signed)
Patient still could potentially have bronchitis with asthma exacerbation though I would certainly expect the asthma component to be better with the prednisone.  We will add azithromycin 500 mg on day 1 and 250 mg on days 2 through 5.  She will continue her Augmentin.  Will get a chest x-ray today.  If she develops worsening shortness of breath, cough abdominal blood, fevers, or worsening symptoms she should be reevaluated.  Work note provided to keep her out through tomorrow.

## 2022-12-07 NOTE — Telephone Encounter (Signed)
Pt called in looking for the results of X-ray taken on 12/05/22. Pt would like a call back when results are back.

## 2022-12-07 NOTE — Telephone Encounter (Signed)
Noted  

## 2022-12-09 ENCOUNTER — Telehealth: Payer: Self-pay | Admitting: Family Medicine

## 2022-12-12 NOTE — Telephone Encounter (Signed)
LMTCB

## 2022-12-12 NOTE — Telephone Encounter (Signed)
Left message to call the office back to see if she needs the Trazodone.

## 2022-12-12 NOTE — Telephone Encounter (Signed)
Pt returned [facility] CMA call. Note below was read to her. Pt stated that she does not need this med because it makes her leg act different.

## 2022-12-12 NOTE — Telephone Encounter (Signed)
I believe this is an automatic pharmacy request. Please see if the patient still needs this.

## 2022-12-12 NOTE — Telephone Encounter (Signed)
Noted  

## 2022-12-13 ENCOUNTER — Encounter: Payer: Self-pay | Admitting: Family Medicine

## 2022-12-13 ENCOUNTER — Other Ambulatory Visit: Payer: Self-pay | Admitting: Family

## 2022-12-13 MED ORDER — PROMETHAZINE-DM 6.25-15 MG/5ML PO SYRP: 5.0000 mL | ORAL_SOLUTION | Freq: Four times a day (QID) | ORAL | 0 refills | Status: DC | PRN

## 2022-12-13 MED ORDER — METHYLPREDNISOLONE 4 MG PO TBPK: ORAL_TABLET | ORAL | 0 refills | Status: DC

## 2022-12-13 NOTE — Telephone Encounter (Signed)
Patient is aware that the medications were called in.

## 2023-02-10 ENCOUNTER — Other Ambulatory Visit: Payer: Self-pay | Admitting: Family Medicine

## 2023-02-10 DIAGNOSIS — I10 Essential (primary) hypertension: Secondary | ICD-10-CM

## 2023-02-24 ENCOUNTER — Ambulatory Visit: Payer: BC Managed Care – PPO | Admitting: Family Medicine

## 2023-04-10 ENCOUNTER — Other Ambulatory Visit: Payer: Self-pay | Admitting: Family Medicine

## 2023-04-18 ENCOUNTER — Telehealth: Payer: Self-pay | Admitting: Family Medicine

## 2023-04-18 NOTE — Telephone Encounter (Signed)
Pt called to cancel f/u appt with Dr. Birdie Sons on 12/13 to r/s out to Feb 2025. Pt asked since Dr. Birdie Sons will be leaving the office next month, can Dr. Darrick Huntsman become her new pcp? Pt mentioned she's spoken to Dr. Darrick Huntsman before regarding switching care once Dr. Birdie Sons leaves. Is this TOC okay? Call back # [phone]

## 2023-04-19 NOTE — Telephone Encounter (Signed)
noted 

## 2023-04-28 ENCOUNTER — Ambulatory Visit: Payer: BC Managed Care – PPO | Admitting: Family Medicine

## 2023-05-11 ENCOUNTER — Other Ambulatory Visit: Payer: Self-pay | Admitting: Family Medicine

## 2023-06-05 ENCOUNTER — Other Ambulatory Visit: Payer: Self-pay | Admitting: Family Medicine

## 2023-06-06 ENCOUNTER — Other Ambulatory Visit: Payer: Self-pay | Admitting: Family Medicine

## 2023-06-08 ENCOUNTER — Other Ambulatory Visit: Payer: Self-pay | Admitting: Family Medicine

## 2023-06-28 ENCOUNTER — Encounter: Payer: BC Managed Care – PPO | Admitting: Internal Medicine

## 2023-09-04 ENCOUNTER — Encounter: Payer: Self-pay | Admitting: Internal Medicine

## 2023-09-04 ENCOUNTER — Ambulatory Visit: Payer: BC Managed Care – PPO | Admitting: Internal Medicine

## 2023-09-04 VITALS — BP 126/70 | HR 82 | Ht 64.0 in | Wt 185.8 lb

## 2023-09-04 DIAGNOSIS — N924 Excessive bleeding in the premenopausal period: Secondary | ICD-10-CM | POA: Diagnosis not present

## 2023-09-04 DIAGNOSIS — Z1231 Encounter for screening mammogram for malignant neoplasm of breast: Secondary | ICD-10-CM

## 2023-09-04 DIAGNOSIS — E785 Hyperlipidemia, unspecified: Secondary | ICD-10-CM | POA: Diagnosis not present

## 2023-09-04 DIAGNOSIS — Z7984 Long term (current) use of oral hypoglycemic drugs: Secondary | ICD-10-CM

## 2023-09-04 DIAGNOSIS — E782 Mixed hyperlipidemia: Secondary | ICD-10-CM

## 2023-09-04 DIAGNOSIS — E66811 Obesity, class 1: Secondary | ICD-10-CM | POA: Diagnosis not present

## 2023-09-04 DIAGNOSIS — D751 Secondary polycythemia: Secondary | ICD-10-CM

## 2023-09-04 DIAGNOSIS — I10 Essential (primary) hypertension: Secondary | ICD-10-CM

## 2023-09-04 DIAGNOSIS — R300 Dysuria: Secondary | ICD-10-CM

## 2023-09-04 DIAGNOSIS — E1165 Type 2 diabetes mellitus with hyperglycemia: Secondary | ICD-10-CM

## 2023-09-04 DIAGNOSIS — J4520 Mild intermittent asthma, uncomplicated: Secondary | ICD-10-CM

## 2023-09-04 LAB — MICROALBUMIN / CREATININE URINE RATIO
Creatinine,U: 17.5 mg/dL
Microalb Creat Ratio: UNDETERMINED mg/g (ref 0.0–30.0)
Microalb, Ur: <0.7 mg/dL

## 2023-09-04 LAB — CBC WITH DIFFERENTIAL/PLATELET
Basophils Absolute: 0.1 10*3/uL (ref 0.0–0.1)
Basophils Relative: 0.9 % (ref 0.0–3.0)
Eosinophils Absolute: 0.1 10*3/uL (ref 0.0–0.7)
Eosinophils Relative: 2.2 % (ref 0.0–5.0)
HCT: 49.1 % — ABNORMAL HIGH (ref 36.0–46.0)
Hemoglobin: 16.1 g/dL — ABNORMAL HIGH (ref 12.0–15.0)
Lymphocytes Relative: 23.1 % (ref 12.0–46.0)
Lymphs Abs: 1.4 10*3/uL (ref 0.7–4.0)
MCHC: 32.8 g/dL (ref 30.0–36.0)
MCV: 91.5 fl (ref 78.0–100.0)
Monocytes Absolute: 0.4 10*3/uL (ref 0.1–1.0)
Monocytes Relative: 6.2 % (ref 3.0–12.0)
Neutro Abs: 4.1 10*3/uL (ref 1.4–7.7)
Neutrophils Relative %: 67.6 % (ref 43.0–77.0)
Platelets: 229 10*3/uL (ref 150.0–400.0)
RBC: 5.37 Mil/uL — ABNORMAL HIGH (ref 3.87–5.11)
RDW: 13.5 % (ref 11.5–15.5)
WBC: 6 10*3/uL (ref 4.0–10.5)

## 2023-09-04 LAB — LIPID PANEL
Cholesterol: 213 mg/dL — ABNORMAL HIGH (ref 0–200)
HDL: 44.6 mg/dL (ref >39.00)
LDL Cholesterol: 126 mg/dL — ABNORMAL HIGH (ref 0–99)
NonHDL: 168.44
Total CHOL/HDL Ratio: 5
Triglycerides: 214 mg/dL — ABNORMAL HIGH (ref 0.0–149.0)
VLDL: 42.8 mg/dL — ABNORMAL HIGH (ref 0.0–40.0)

## 2023-09-04 LAB — URINALYSIS, ROUTINE W REFLEX MICROSCOPIC
Bilirubin Urine: NEGATIVE
Hgb urine dipstick: NEGATIVE
Ketones, ur: NEGATIVE
Leukocytes,Ua: NEGATIVE
Nitrite: NEGATIVE
RBC / HPF: NONE SEEN (ref 0–?)
Specific Gravity, Urine: <=1.005 — AB (ref 1.000–1.030)
Total Protein, Urine: NEGATIVE
Urine Glucose: >=1000 — AB
Urobilinogen, UA: 0.2 (ref 0.0–1.0)
pH: 5.5 (ref 5.0–8.0)

## 2023-09-04 LAB — COMPREHENSIVE METABOLIC PANEL WITH GFR
ALT: 18 U/L (ref 0–35)
AST: 14 U/L (ref 0–37)
Albumin: 5 g/dL (ref 3.5–5.2)
Alkaline Phosphatase: 85 U/L (ref 39–117)
BUN: 13 mg/dL (ref 6–23)
CO2: 27 meq/L (ref 19–32)
Calcium: 9.7 mg/dL (ref 8.4–10.5)
Chloride: 101 meq/L (ref 96–112)
Creatinine, Ser: 0.8 mg/dL (ref 0.40–1.20)
GFR: 84.44 mL/min (ref >60.00)
Glucose, Bld: 151 mg/dL — ABNORMAL HIGH (ref 70–99)
Potassium: 4.1 meq/L (ref 3.5–5.1)
Sodium: 138 meq/L (ref 135–145)
Total Bilirubin: 0.7 mg/dL (ref 0.2–1.2)
Total Protein: 7.6 g/dL (ref 6.0–8.3)

## 2023-09-04 LAB — HEMOGLOBIN A1C: Hgb A1c MFr Bld: 6.6 % — ABNORMAL HIGH (ref 4.6–6.5)

## 2023-09-04 LAB — LDL CHOLESTEROL, DIRECT: Direct LDL: 148 mg/dL

## 2023-09-04 LAB — TSH: TSH: 1.78 u[IU]/mL (ref 0.35–5.50)

## 2023-09-04 MED ORDER — ONDANSETRON 4 MG PO TBDP: 4.0000 mg | ORAL_TABLET | Freq: Three times a day (TID) | ORAL | 0 refills | Status: AC | PRN

## 2023-09-04 MED ORDER — AMLODIPINE BESYLATE 10 MG PO TABS: 10.0000 mg | ORAL_TABLET | Freq: Every day | ORAL | 3 refills | Status: AC

## 2023-09-04 MED ORDER — EMPAGLIFLOZIN 25 MG PO TABS: 25.0000 mg | ORAL_TABLET | Freq: Every day | ORAL | 1 refills | Status: DC

## 2023-09-04 MED ORDER — DOXYCYCLINE HYCLATE 100 MG PO TABS: 100.0000 mg | ORAL_TABLET | Freq: Two times a day (BID) | ORAL | 0 refills | Status: AC

## 2023-09-04 MED ORDER — LISINOPRIL 5 MG PO TABS
5.0000 mg | ORAL_TABLET | Freq: Every day | ORAL | 1 refills | Status: DC
Start: 2023-09-04 — End: 2024-03-07

## 2023-09-04 NOTE — Assessment & Plan Note (Addendum)
 Reviewed 2024 workup by Hematology, which was negative for mutations.  No BM bopsy done.    May be secondary to OSA.  Will repeat CBC and recommend sleep study if still present   Lab Results  Component Value Date   WBC 6.0 09/04/2023   HGB 16.1 (H) 09/04/2023   HCT 49.1 (H) 09/04/2023   MCV 91.5 09/04/2023   PLT 229.0 09/04/2023

## 2023-09-04 NOTE — Patient Instructions (Signed)
 Nice to meet you!   We will hold off on testing for sleep apnea since you do not have any signs or symptoms   [facility] Spotted Fever  can present with fever and a headache, NOT a rash; s the occurrence of these symptoms during spring/summer/fall in the context of recent tick exposure is RMSF until proven otherwise and should be treated immediately with doxycycline .  I have sent this rx to your pharmacy along with zofran   ,  to use if you develop these symptoms.  Let me know if this occurs so we can set you up for the appropriate testing .

## 2023-09-04 NOTE — Progress Notes (Signed)
 Subjective:  Patient ID: Nicole Bryant, female    DOB: December 23, 1970  Age: 53 y.o. MRN: [id number]  CC: The primary encounter diagnosis was Type 2 diabetes mellitus with hyperglycemia, without long-term current use of insulin  (HCC). Diagnoses of Primary hypertension, Hyperlipidemia, unspecified hyperlipidemia type, Obesity (BMI 30.0-34.9), Encounter for screening mammogram for malignant neoplasm of breast, Menopausal bleeding, Erythrocytosis, and Mild intermittent asthma without complication were also pertinent to this visit.   HPI ADIN LARICCIA presents for  Chief Complaint  Patient presents with   Transfer of Care     Nicole Bryant is a 53 yr old female with obesity diabetes and hypertension , erythrocytosis of unclear etiology, who is here for follow up on acute and chronic issues.  Last appt with Dr Lovetta Rucks was > 6 month sago  Cc:  she had an episode of Post menopausal spotting that loccurred on April 11   and lasted 5 days.  The spotting was accompanied by  lower abdominal cramps and low back pain,  similar to menstrual symptoms.   Last menstrual period was 4 over 1 year  ago.  Has scheduled a gyn appt   Bruxism :  uses a nightguard prescribed by her dentist who as advised her to get tested for  OSA .  patient denies any history of palpations, morning fatigue and headache,  daytime somnolence ,  but does snore and has no prior screening despite erythrocytosis of uncertain etiology   Diabetes/obesity and HTN:   she feels generally well, is exercising several times per week and checking blood sugars infrequently   BS have been under 130 fasting and < 150 post prandially.  Denies any recent hypoglyemic events.  Taking Jardiance  and metformin .  Following a carbohydrate modified diet 6 days per week. Denies numbness, burning and tingling of extremities. Appetite is good.     Outpatient Medications Prior to Visit  Medication Sig Dispense Refill   albuterol  (VENTOLIN  HFA) 108  (90 Base) MCG/ACT inhaler INHALE TWO PUFFS INTO THE LUNGS EVERY SIX HOURS AS NEEDED FOR WHEEZING OR SHORTNESS OF BREATH. 8.5 g 0   Blood Glucose Monitoring Suppl (RELION CONFIRM GLUCOSE MONITOR) w/Device KIT Check your blood glucose once daily before breakfast 1 kit 0   cholecalciferol (VITAMIN D ) 1000 units tablet Take 5,000 Units by mouth daily.     CONTOUR NEXT TEST test strip USE TO CHECK BLOOD SUGAR ONCE DAILY BEFORE BREAKFAST 100 each 12   FEROSUL 325 (65 Fe) MG tablet TAKE 1 TABLET BY MOUTH TWICE A DAY WITH MEAL. TAKE WITH VITAMIN CAPSULE 30 tablet 1   fluticasone  (FLONASE ) 50 MCG/ACT nasal spray USE 2 SPRAYS IN EACH NOSTRIL ONCE DAILY 16 g 2   Lancet Devices (ONE TOUCH DELICA LANCING DEV) MISC   0   Loratadine  (CLARITIN ) 10 MG CAPS Take 10 mg by mouth daily.     metFORMIN  (GLUCOPHAGE ) 500 MG tablet TAKE TWO TABLETS BY MOUTH TWICE A DAY WITH A MEAL 360 tablet 1   MICROLET LANCETS MISC CHECK YOUR BLOOD SUGAR ONCE DAILY BEFOREBREAKFAST 100 each 1   omeprazole  (PRILOSEC) 40 MG capsule TAKE ONE CAPSULE (40 MG TOTAL) BY MOUTH DAILY. 30 capsule 3   ondansetron  (ZOFRAN  ODT) 8 MG disintegrating tablet Take 1 tablet (8 mg total) by mouth every 8 (eight) hours as needed for nausea or vomiting. 20 tablet 0   triamcinolone  cream (KENALOG ) 0.1 % APPLY TOPICALLY 2 TIMES DAILY 30 g 0   amLODipine  (NORVASC ) 10 MG tablet Take 1 tablet (  10 mg total) by mouth daily. 90 tablet 3   amoxicillin -clavulanate (AUGMENTIN ) 875-125 MG tablet Take 1 tablet by mouth 2 (two) times daily. (Patient not taking: Reported on 09/04/2023) 14 tablet 0   benzonatate  (TESSALON ) 200 MG capsule Take 1 capsule (200 mg total) by mouth 2 (two) times daily as needed for cough. (Patient not taking: Reported on 09/04/2023) 20 capsule 0   JARDIANCE  25 MG TABS tablet TAKE ONE TABLET BY MOUTH ONCE A DAY 90 tablet 1   lisinopril  (ZESTRIL ) 5 MG tablet TAKE ONE TABLET BY MOUTH ONCE DAILY 90 tablet 1   methylPREDNISolone  (MEDROL  DOSEPAK) 4 MG TBPK  tablet As directed (Patient not taking: Reported on 09/04/2023) 21 tablet 0   predniSONE  (DELTASONE ) 20 MG tablet Take 2 tablets (40 mg total) by mouth daily with breakfast. (Patient not taking: Reported on 09/04/2023) 10 tablet 0   promethazine -dextromethorphan (PROMETHAZINE -DM) 6.25-15 MG/5ML syrup Take 5 mLs by mouth 4 (four) times daily as needed. (Patient not taking: Reported on 09/04/2023) 118 mL 0   traZODone  (DESYREL ) 50 MG tablet Take 0.5-1 tablets (25-50 mg total) by mouth at bedtime as needed for sleep. (Patient not taking: Reported on 09/04/2023) 10 tablet 0   No facility-administered medications prior to visit.    Review of Systems;  Patient denies headache, fevers, malaise, unintentional weight loss, skin rash, eye pain, sinus congestion and sinus pain, sore throat, dysphagia,  hemoptysis , cough, dyspnea, wheezing, chest pain, palpitations, orthopnea, edema, abdominal pain, nausea, melena, diarrhea, constipation, flank pain, dysuria, hematuria, urinary  Frequency, nocturia, numbness, tingling, seizures,  Focal weakness, Loss of consciousness,  Tremor, insomnia, depression, anxiety, and suicidal ideation.      Objective:  BP 126/70   Pulse 82   Ht 5\' 4"  (1.626 m)   Wt 185 lb 12.8 oz (84.3 kg)   SpO2 99%   BMI 31.89 kg/m   BP Readings from Last 3 Encounters:  09/04/23 126/70  12/05/22 (!) 144/88  11/22/22 122/78    Wt Readings from Last 3 Encounters:  09/04/23 185 lb 12.8 oz (84.3 kg)  12/05/22 182 lb 6.4 oz (82.7 kg)  11/22/22 183 lb 12.8 oz (83.4 kg)    Physical Exam Vitals reviewed.  Constitutional:      General: She is not in acute distress.    Appearance: Normal appearance. She is obese. She is not ill-appearing, toxic-appearing or diaphoretic.  HENT:     Head: Normocephalic.  Eyes:     General: No scleral icterus.       Right eye: No discharge.        Left eye: No discharge.     Conjunctiva/sclera: Conjunctivae normal.  Cardiovascular:     Rate and  Rhythm: Normal rate and regular rhythm.     Heart sounds: Normal heart sounds.  Pulmonary:     Effort: Pulmonary effort is normal. No respiratory distress.     Breath sounds: Normal breath sounds.  Musculoskeletal:        General: Normal range of motion.  Skin:    General: Skin is warm and dry.  Neurological:     General: No focal deficit present.     Mental Status: She is alert and oriented to person, place, and time. Mental status is at baseline.  Psychiatric:        Mood and Affect: Mood normal.        Behavior: Behavior normal.        Thought Content: Thought content normal.  Judgment: Judgment normal.     Lab Results  Component Value Date   HGBA1C 6.6 (H) 09/04/2023   HGBA1C 6.1 (A) 11/22/2022   HGBA1C 6.0 (A) 07/08/2022    Lab Results  Component Value Date   CREATININE 0.80 09/04/2023   CREATININE 0.79 06/01/2022   CREATININE 0.70 01/04/2022    Lab Results  Component Value Date   WBC 6.0 09/04/2023   HGB 16.1 (H) 09/04/2023   HCT 49.1 (H) 09/04/2023   PLT 229.0 09/04/2023   GLUCOSE 151 (H) 09/04/2023   CHOL 213 (H) 09/04/2023   TRIG 214.0 (H) 09/04/2023   HDL 44.60 09/04/2023   LDLDIRECT 148.0 09/04/2023   LDLCALC 126 (H) 09/04/2023   ALT 18 09/04/2023   AST 14 09/04/2023   NA 138 09/04/2023   K 4.1 09/04/2023   CL 101 09/04/2023   CREATININE 0.80 09/04/2023   BUN 13 09/04/2023   CO2 27 09/04/2023   TSH 1.78 09/04/2023   INR 1.20 09/08/2015   HGBA1C 6.6 (H) 09/04/2023   MICROALBUR <0.7 09/04/2023    DG Chest 2 View Result Date: 12/08/2022 CLINICAL DATA:  Shortness of breath. EXAM: CHEST - 2 VIEW COMPARISON:  Chest radiograph 06/02/2020 FINDINGS: The heart size and mediastinal contours are within normal limits. Both lungs are clear. The visualized skeletal structures are unremarkable. IMPRESSION: No active cardiopulmonary disease. Electronically Signed   By: Jone Neither M.D.   On: 12/08/2022 12:17    Assessment & Plan:  .Type 2 diabetes  mellitus with hyperglycemia, without long-term current use of insulin  [facility]) Assessment & Plan: Historically well-controlled on current medications .  Aaron Aas Patient is reminded to schedule an annual eye exam and foot exam is normal today. Patient has no microalbuminuria. Patient  remains is opposed to  statin therapy for CAD risk reduction;  she is tolerating  ACE/ARB for renal protection and hypertension  She will continue Jardiance  25 mg daily and metformin  1000 mg twice daily.  Lab Results  Component Value Date   HGBA1C 6.6 (H) 09/04/2023   Lab Results  Component Value Date   MICROALBUR <0.7 09/04/2023   MICROALBUR <0.7 01/04/2022       Orders: -     Comprehensive metabolic panel with GFR -     Hemoglobin A1c -     Microalbumin / creatinine urine ratio  Primary hypertension Assessment & Plan: Well controlled on current regimen. Renal function stable, no changes today.   Lab Results  Component Value Date   CREATININE 0.79 06/01/2022   Lab Results  Component Value Date   NA 137 06/01/2022   K 3.8 06/01/2022   CL 103 06/01/2022   CO2 24 06/01/2022     Orders: -     Lisinopril ; Take 1 tablet (5 mg total) by mouth daily.  Dispense: 90 tablet; Refill: 1 -     Comprehensive metabolic panel with GFR -     Microalbumin / creatinine urine ratio  Hyperlipidemia, unspecified hyperlipidemia type -     Lipid panel -     LDL cholesterol, direct  Obesity (BMI 30.0-34.9) -     CBC with Differential/Platelet -     TSH  Encounter for screening mammogram for malignant neoplasm of breast -     3D Screening Mammogram, Left and Right; Future  Menopausal bleeding Assessment & Plan: Ruling out UTI>  agree wit GYN evaluation to rule out cervical /endometrial CA   Orders: -     Urinalysis, Routine w reflex microscopic -  Urine Culture  Erythrocytosis Assessment & Plan: Reviewed 2024 workup by Hematology, which was negative for mutations.  No BM bopsy done.    May be secondary  to OSA.  Will repeat CBC and recommend sleep study if still present   Lab Results  Component Value Date   WBC 6.0 09/04/2023   HGB 16.1 (H) 09/04/2023   HCT 49.1 (H) 09/04/2023   MCV 91.5 09/04/2023   PLT 229.0 09/04/2023      Mild intermittent asthma without complication Assessment & Plan: Asymptomatic.  Continue albuterol  PRN.   Other orders -     amLODIPine  Besylate; Take 1 tablet (10 mg total) by mouth daily.  Dispense: 90 tablet; Refill: 3 -     Empagliflozin ; Take 1 tablet (25 mg total) by mouth daily.  Dispense: 90 tablet; Refill: 1 -     Doxycycline  Hyclate; Take 1 tablet (100 mg total) by mouth 2 (two) times daily. WITH FOOD  Dispense: 14 tablet; Refill: 0 -     Ondansetron ; Take 1 tablet (4 mg total) by mouth every 8 (eight) hours as needed for nausea or vomiting.  Dispense: 20 tablet; Refill: 0     I spent 44 minutes on the day of this face to face encounter reviewing patient's  most recent visit with  hematology, prior relevant surgical and non surgical procedures, recent  labs and imaging studies, counseling on diabetes and weight management,  reviewing the assessment and plan with patient, and post visit ordering and reviewing of  diagnostics and therapeutics with patient  .   Follow-up: Return in about 6 months (around 03/05/2024) for follow up diabetes, physical.   Thersia Flax, MD

## 2023-09-04 NOTE — Assessment & Plan Note (Signed)
 Well controlled on current regimen. Renal function stable, no changes today.   Lab Results  Component Value Date   CREATININE 0.79 06/01/2022   Lab Results  Component Value Date   NA 137 06/01/2022   K 3.8 06/01/2022   CL 103 06/01/2022   CO2 24 06/01/2022

## 2023-09-04 NOTE — Assessment & Plan Note (Signed)
 Ruling out UTI>  agree wit GYN evaluation to rule out cervical /endometrial CA

## 2023-09-04 NOTE — Assessment & Plan Note (Addendum)
 Historically well-controlled on current medications .  Nicole Bryant Patient is reminded to schedule an annual eye exam and foot exam is normal today. Patient has no microalbuminuria. Patient  remains is opposed to  statin therapy for CAD risk reduction;  she is tolerating  ACE/ARB for renal protection and hypertension  She will continue Jardiance  25 mg daily and metformin  1000 mg twice daily.  Lab Results  Component Value Date   HGBA1C 6.6 (H) 09/04/2023   Lab Results  Component Value Date   MICROALBUR <0.7 09/04/2023   MICROALBUR <0.7 01/04/2022

## 2023-09-04 NOTE — Assessment & Plan Note (Signed)
Asymptomatic.   - Continue albuterol PRN

## 2023-09-05 LAB — URINE CULTURE
MICRO NUMBER:: 16353617
Result:: NO GROWTH
SPECIMEN QUALITY:: ADEQUATE

## 2023-09-06 MED ORDER — ATORVASTATIN CALCIUM 10 MG PO TABS: 10.0000 mg | ORAL_TABLET | Freq: Every day | ORAL | 0 refills | Status: DC

## 2023-09-06 NOTE — Addendum Note (Signed)
 Addended by: Thersia Flax on: 09/06/2023 12:48 PM   Modules accepted: Orders

## 2023-09-13 ENCOUNTER — Encounter: Admitting: Obstetrics and Gynecology

## 2023-09-18 DIAGNOSIS — Z1272 Encounter for screening for malignant neoplasm of vagina: Secondary | ICD-10-CM | POA: Diagnosis not present

## 2023-09-18 DIAGNOSIS — E119 Type 2 diabetes mellitus without complications: Secondary | ICD-10-CM | POA: Insufficient documentation

## 2023-09-18 DIAGNOSIS — Z1151 Encounter for screening for human papillomavirus (HPV): Secondary | ICD-10-CM | POA: Diagnosis not present

## 2023-09-18 DIAGNOSIS — N950 Postmenopausal bleeding: Secondary | ICD-10-CM | POA: Diagnosis not present

## 2023-09-20 DIAGNOSIS — R300 Dysuria: Secondary | ICD-10-CM | POA: Insufficient documentation

## 2023-09-22 LAB — HM PAP SMEAR: HPV, high-risk: NEGATIVE

## 2023-09-26 ENCOUNTER — Telehealth: Payer: Self-pay

## 2023-09-26 NOTE — Telephone Encounter (Signed)
 Last read by Angelica Kemp at 9:12AM on 09/18/2023.

## 2023-09-26 NOTE — Telephone Encounter (Signed)
 Left message for Patient to call back and schedule a cmp & ck nonfasting lab around 10/11/23.

## 2023-09-26 NOTE — Telephone Encounter (Signed)
 Left message to call the office back to schedule a cmp & ck non fasting lab for around 10/11/23.

## 2023-09-27 ENCOUNTER — Other Ambulatory Visit: Payer: Self-pay

## 2023-09-27 MED ORDER — OMEPRAZOLE 40 MG PO CPDR: DELAYED_RELEASE_CAPSULE | ORAL | 3 refills | Status: DC

## 2023-09-27 NOTE — Telephone Encounter (Signed)
 Previously filled by Dr. Lovetta Rucks.  Refilled: 06/08/2023 Last OV: 09/04/2023 Next OV: 03/08/2024

## 2023-10-02 ENCOUNTER — Other Ambulatory Visit (INDEPENDENT_AMBULATORY_CARE_PROVIDER_SITE_OTHER)

## 2023-10-02 DIAGNOSIS — E1165 Type 2 diabetes mellitus with hyperglycemia: Secondary | ICD-10-CM

## 2023-10-02 DIAGNOSIS — E782 Mixed hyperlipidemia: Secondary | ICD-10-CM

## 2023-10-03 LAB — COMPREHENSIVE METABOLIC PANEL WITH GFR
ALT: 20 U/L (ref 0–35)
AST: 22 U/L (ref 0–37)
Albumin: 4.8 g/dL (ref 3.5–5.2)
Alkaline Phosphatase: 59 U/L (ref 39–117)
BUN: 16 mg/dL (ref 6–23)
CO2: 21 meq/L (ref 19–32)
Calcium: 9.5 mg/dL (ref 8.4–10.5)
Chloride: 101 meq/L (ref 96–112)
Creatinine, Ser: 0.85 mg/dL (ref 0.40–1.20)
GFR: 78.47 mL/min (ref >60.00)
Glucose, Bld: 165 mg/dL — ABNORMAL HIGH (ref 70–99)
Potassium: 4 meq/L (ref 3.5–5.1)
Sodium: 135 meq/L (ref 135–145)
Total Bilirubin: 0.9 mg/dL (ref 0.2–1.2)
Total Protein: 7.6 g/dL (ref 6.0–8.3)

## 2023-10-03 LAB — CK: Total CK: 261 U/L — ABNORMAL HIGH (ref 7–177)

## 2023-10-03 NOTE — Telephone Encounter (Signed)
 Patient got lab work done on 10/02/23.

## 2023-10-04 ENCOUNTER — Ambulatory Visit: Payer: Self-pay | Admitting: Internal Medicine

## 2023-10-05 ENCOUNTER — Encounter: Payer: Self-pay | Admitting: Internal Medicine

## 2023-10-05 DIAGNOSIS — D751 Secondary polycythemia: Secondary | ICD-10-CM

## 2023-10-24 DIAGNOSIS — N950 Postmenopausal bleeding: Secondary | ICD-10-CM | POA: Diagnosis not present

## 2023-10-31 ENCOUNTER — Ambulatory Visit
Admission: RE | Admit: 2023-10-31 | Discharge: 2023-10-31 | Disposition: A | Discharge status: Home / Self Care | Source: Ambulatory Visit | Attending: Internal Medicine | Admitting: Internal Medicine

## 2023-10-31 DIAGNOSIS — Z1231 Encounter for screening mammogram for malignant neoplasm of breast: Secondary | ICD-10-CM

## 2023-11-02 ENCOUNTER — Telehealth: Payer: Self-pay

## 2023-11-02 NOTE — Telephone Encounter (Signed)
 Chart shows Mammogram was done on 10/31/23.

## 2023-11-29 ENCOUNTER — Other Ambulatory Visit: Payer: Self-pay

## 2023-11-29 MED ORDER — METFORMIN HCL 500 MG PO TABS
ORAL_TABLET | ORAL | 0 refills | Status: DC
Start: 2023-11-29 — End: 2024-02-23

## 2023-11-30 ENCOUNTER — Other Ambulatory Visit: Payer: Self-pay | Admitting: Internal Medicine

## 2024-01-01 ENCOUNTER — Encounter: Payer: Self-pay | Admitting: General Practice

## 2024-01-01 ENCOUNTER — Ambulatory Visit: Admitting: General Practice

## 2024-01-01 ENCOUNTER — Ambulatory Visit: Payer: Self-pay

## 2024-01-01 VITALS — BP 120/86 | HR 86 | Temp 98.1°F | Ht 64.0 in | Wt 190.0 lb

## 2024-01-01 DIAGNOSIS — R051 Acute cough: Secondary | ICD-10-CM | POA: Insufficient documentation

## 2024-01-01 DIAGNOSIS — R0789 Other chest pain: Secondary | ICD-10-CM | POA: Diagnosis not present

## 2024-01-01 DIAGNOSIS — J4521 Mild intermittent asthma with (acute) exacerbation: Secondary | ICD-10-CM | POA: Diagnosis not present

## 2024-01-01 LAB — POC COVID19 BINAXNOW: SARS Coronavirus 2 Ag: NEGATIVE

## 2024-01-01 LAB — POCT INFLUENZA A/B
Influenza A, POC: NEGATIVE
Influenza B, POC: NEGATIVE

## 2024-01-01 MED ORDER — BENZONATATE 200 MG PO CAPS: 200.0000 mg | ORAL_CAPSULE | Freq: Three times a day (TID) | ORAL | 0 refills | Status: AC | PRN

## 2024-01-01 MED ORDER — PREDNISONE 20 MG PO TABS: 40.0000 mg | ORAL_TABLET | Freq: Every day | ORAL | 0 refills | Status: AC

## 2024-01-01 NOTE — Telephone Encounter (Signed)
 Noted

## 2024-01-01 NOTE — Patient Instructions (Signed)
 Start prednisone  20 mg tablets. Take 2 tablets by mouth once daily in the morning for 5 days.  Continue Albuterol  as needed.   Start Benzonatate  capsules for cough. Take 1 capsule by mouth three times daily as needed for cough.  You can try a few things over the counter to help with your symptoms including:  Cough: Delsym or Robitussin (get the off brand, works just as well) Chest Congestion: Mucinex (plain) Nasal Congestion/Ear Pressure/Sinus Pressure: Try using Flonase  (fluticasone ) nasal spray. Instill 1 spray in each nostril twice daily. This can be purchased over the counter. Body aches, fevers, headache: Ibuprofen (not to exceed 2400 mg in 24 hours) or Acetaminophen -Tylenol  (not to exceed 3000 mg in 24 hours) Runny Nose/Throat Drainage/Sneezing/Itchy or Watery Eyes: An antihistamine such as Zyrtec, Claritin , Xyzal, Allegra  You should be feeling better by day seven of symptoms, but please do schedule an appointment if this is not the case.  Please schedule follow up with PCP if your symptoms worsen or do not improve.   It was a pleasure meeting you!

## 2024-01-01 NOTE — Telephone Encounter (Signed)
 Spoke to pt she stated that she was around a lot of people smoking on Friday and that may be cause for the chest tightness, right earache and jaw nodule just popped up overnight, pt refused ED and UC and stated that she was ok with just going for office appt. Scheduled her to see Carrol Aurora @ [facility] today @ 10 AM
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
## 2024-01-02 ENCOUNTER — Telehealth: Payer: Self-pay

## 2024-01-02 NOTE — Telephone Encounter (Signed)
Note done and sent to mychart

## 2024-01-02 NOTE — Telephone Encounter (Signed)
 Called and discussed symptoms with patient. She is feeling better since starting the prednisone . Advised patient to continue Prednisone  and keep an eye on her blood sugars. ER precautions provided.   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 Patient advised that note will be sent via mychart.   Jenette Aurora, DNP, AGNP-C 01/02/2024 10:53 AM

## 2024-01-02 NOTE — Telephone Encounter (Signed)
 Copied from CRM [id number]. Topic: Clinical - Medical Advice >> Jan 02, 2024  9:10 AM Revonda D wrote: Reason for CRM: Pt stated that she was recently prescribed predniSONE  and since taking the medication her sugar went up to 323, pt stated that she took the medication 2 hours later and it went down to 245. Pt has not taken the medication today yet and wanted to get medical advice if she should still be taking it. Pt would also like to get a letter stating that she can return back to work tomorrow. Pt would like a callback with an update yet.

## 2024-01-09 ENCOUNTER — Encounter: Payer: Self-pay | Admitting: General Practice

## 2024-01-09 DIAGNOSIS — B9689 Other specified bacterial agents as the cause of diseases classified elsewhere: Secondary | ICD-10-CM

## 2024-01-09 MED ORDER — AMOXICILLIN-POT CLAVULANATE 875-125 MG PO TABS
1.0000 | ORAL_TABLET | Freq: Two times a day (BID) | ORAL | 0 refills | Status: AC
Start: 2024-01-09 — End: 2024-01-16

## 2024-02-12 ENCOUNTER — Other Ambulatory Visit: Payer: Self-pay | Admitting: Internal Medicine

## 2024-02-22 ENCOUNTER — Other Ambulatory Visit: Payer: Self-pay | Admitting: Internal Medicine

## 2024-03-08 ENCOUNTER — Ambulatory Visit: Admitting: Internal Medicine

## 2024-03-08 ENCOUNTER — Encounter: Payer: Self-pay | Admitting: Internal Medicine

## 2024-03-08 VITALS — BP 125/72 | HR 76 | Temp 98.7°F | Ht 64.0 in | Wt 191.2 lb

## 2024-03-08 DIAGNOSIS — E1165 Type 2 diabetes mellitus with hyperglycemia: Secondary | ICD-10-CM | POA: Diagnosis not present

## 2024-03-08 DIAGNOSIS — D751 Secondary polycythemia: Secondary | ICD-10-CM | POA: Diagnosis not present

## 2024-03-08 DIAGNOSIS — Z7984 Long term (current) use of oral hypoglycemic drugs: Secondary | ICD-10-CM

## 2024-03-08 DIAGNOSIS — E66811 Obesity, class 1: Secondary | ICD-10-CM

## 2024-03-08 DIAGNOSIS — N924 Excessive bleeding in the premenopausal period: Secondary | ICD-10-CM

## 2024-03-08 DIAGNOSIS — Z0000 Encounter for general adult medical examination without abnormal findings: Secondary | ICD-10-CM | POA: Diagnosis not present

## 2024-03-08 DIAGNOSIS — E785 Hyperlipidemia, unspecified: Secondary | ICD-10-CM | POA: Diagnosis not present

## 2024-03-08 DIAGNOSIS — I10 Essential (primary) hypertension: Secondary | ICD-10-CM

## 2024-03-08 LAB — CBC WITH DIFFERENTIAL/PLATELET
Basophils Absolute: 0.1 K/uL (ref 0.0–0.1)
Basophils Relative: 1.3 % (ref 0.0–3.0)
Eosinophils Absolute: 0.1 K/uL (ref 0.0–0.7)
Eosinophils Relative: 1.7 % (ref 0.0–5.0)
HCT: 47 % — ABNORMAL HIGH (ref 36.0–46.0)
Hemoglobin: 15.5 g/dL — ABNORMAL HIGH (ref 12.0–15.0)
Lymphocytes Relative: 27 % (ref 12.0–46.0)
Lymphs Abs: 1.3 K/uL (ref 0.7–4.0)
MCHC: 33 g/dL (ref 30.0–36.0)
MCV: 91.5 fl (ref 78.0–100.0)
Monocytes Absolute: 0.3 K/uL (ref 0.1–1.0)
Monocytes Relative: 6.2 % (ref 3.0–12.0)
Neutro Abs: 3.1 K/uL (ref 1.4–7.7)
Neutrophils Relative %: 63.8 % (ref 43.0–77.0)
Platelets: 214 K/uL (ref 150.0–400.0)
RBC: 5.13 Mil/uL — ABNORMAL HIGH (ref 3.87–5.11)
RDW: 12.6 % (ref 11.5–15.5)
WBC: 4.8 K/uL (ref 4.0–10.5)

## 2024-03-08 LAB — COMPREHENSIVE METABOLIC PANEL WITH GFR
ALT: 24 U/L (ref 0–35)
AST: 20 U/L (ref 0–37)
Albumin: 5.2 g/dL (ref 3.5–5.2)
Alkaline Phosphatase: 75 U/L (ref 39–117)
BUN: 9 mg/dL (ref 6–23)
CO2: 26 meq/L (ref 19–32)
Calcium: 9.9 mg/dL (ref 8.4–10.5)
Chloride: 101 meq/L (ref 96–112)
Creatinine, Ser: 0.63 mg/dL (ref 0.40–1.20)
GFR: 101.3 mL/min (ref >60.00)
Glucose, Bld: 184 mg/dL — ABNORMAL HIGH (ref 70–99)
Potassium: 4 meq/L (ref 3.5–5.1)
Sodium: 140 meq/L (ref 135–145)
Total Bilirubin: 1.1 mg/dL (ref 0.2–1.2)
Total Protein: 7.8 g/dL (ref 6.0–8.3)

## 2024-03-08 LAB — TSH: TSH: 2.05 u[IU]/mL (ref 0.35–5.50)

## 2024-03-08 LAB — LIPID PANEL
Cholesterol: 148 mg/dL (ref 0–200)
HDL: 46.5 mg/dL (ref >39.00)
LDL Cholesterol: 64 mg/dL (ref 0–99)
NonHDL: 101.57
Total CHOL/HDL Ratio: 3
Triglycerides: 190 mg/dL — ABNORMAL HIGH (ref 0.0–149.0)
VLDL: 38 mg/dL (ref 0.0–40.0)

## 2024-03-08 LAB — LDL CHOLESTEROL, DIRECT: Direct LDL: 84 mg/dL

## 2024-03-08 LAB — HEMOGLOBIN A1C: Hgb A1c MFr Bld: 7.3 % — ABNORMAL HIGH (ref 4.6–6.5)

## 2024-03-08 MED ORDER — OMEPRAZOLE 40 MG PO CPDR: DELAYED_RELEASE_CAPSULE | ORAL | 3 refills | Status: AC

## 2024-03-08 MED ORDER — LISINOPRIL 5 MG PO TABS: 5.0000 mg | ORAL_TABLET | Freq: Every day | ORAL | 1 refills | Status: AC

## 2024-03-08 MED ORDER — GLIPIZIDE ER 2.5 MG PO TB24: 2.5000 mg | ORAL_TABLET | Freq: Every day | ORAL | 3 refills | Status: DC

## 2024-03-08 NOTE — Assessment & Plan Note (Addendum)
 Historically well-controlled on current medications but jardiance  was stopped due to erythrocytosis and BS have been  elevated.  Adding glipizide XL 2.5 mg daily because every other choice is tier 3  .  CBG MONITOR PLACED.  PHARM CONSULT ADDED   Patient is reminded to schedule an annual eye exam and foot exam is normal today. Patient has no microalbuminuria. Patient  remains is opposed to  statin therapy for CAD risk reduction;  she is tolerating  ACE/ARB for renal protection and hypertension  CONTINUE  metformin  1000 mg twice daily.  Lab Results  Component Value Date   HGBA1C 7.3 (H) 03/08/2024   Lab Results  Component Value Date   MICROALBUR <0.7 09/04/2023
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
## 2024-03-08 NOTE — Assessment & Plan Note (Signed)
 Well controlled on current regimen based on home readings . Renal function stable, no changes today.   Lab Results  Component Value Date   CREATININE 0.85 10/02/2023   Lab Results  Component Value Date   NA 135 10/02/2023   K 4.0 10/02/2023   CL 101 10/02/2023   CO2 21 10/02/2023

## 2024-03-08 NOTE — Assessment & Plan Note (Signed)
 Awaiting records from GYN evaluation to rule out cervical /endometrial CA which included an U/s and an endometrial biopys ([facility]) which were reportedly normal.

## 2024-03-08 NOTE — Patient Instructions (Addendum)
 Continue metformin  at your current dose   Adding glipizide XL 2.5 mg  once daily in the morning    If BS get too low at night,  change dosing to evening    Clinical pharmacology referral to see how we can get your insurance to pay more!  If your hemoglobin is still high,  I will recommend the sleep study   The CBG monitor will record your blood sugars 24/7 on your phone so we can evaluate your patterns with the new medication added

## 2024-03-08 NOTE — Assessment & Plan Note (Signed)
 Reviewed 2024 workup by Hematology, which was negative for mutations.  No BM bopsy done.    May be secondary to Jardiance .  Jardiance  was stopped in April   if hgb is still elevated will recommend sleep  study if still present   Lab Results  Component Value Date   WBC 6.0 09/04/2023   HGB 16.1 (H) 09/04/2023   HCT 49.1 (H) 09/04/2023   MCV 91.5 09/04/2023   PLT 229.0 09/04/2023

## 2024-03-10 ENCOUNTER — Ambulatory Visit: Payer: Self-pay | Admitting: Internal Medicine

## 2024-03-10 NOTE — Assessment & Plan Note (Signed)

## 2024-03-11 LAB — ERYTHROPOIETIN: Erythropoietin: 10.4 m[IU]/mL (ref 2.6–18.5)

## 2024-03-13 ENCOUNTER — Telehealth: Payer: Self-pay | Admitting: *Deleted

## 2024-03-13 ENCOUNTER — Encounter: Payer: Self-pay | Admitting: Internal Medicine

## 2024-03-13 NOTE — Progress Notes (Unsigned)
 Care Guide Pharmacy Note  03/13/2024 Name: Nicole Bryant MRN: [id number] DOB: [birth year]  Referred By: [name] [name], MD Reason for referral: Call Attempt #1 and Complex Care Management (Outreach to schedule referral with pharmacist )   Nicole Bryant is a 53 y.o. year old female who is a primary care patient of [name], [name], MD.  Nicole Bryant was referred to the pharmacist for assistance related to: DMII  An unsuccessful telephone outreach was attempted today to contact the patient who was referred to the pharmacy team for assistance with medication management. Additional attempts will be made to contact the patient.  Thedford Franks, CMA [place]  [facility], [facility] Guide Direct Dial: [phone]  Fax: [phone] Website: [email or web address]

## 2024-03-14 NOTE — Progress Notes (Signed)
 Care Guide Pharmacy Note  03/14/2024 Name: Nicole Bryant MRN: [id number] DOB: Jul 05, 1970  Referred By: [name] [name], MD Reason for referral: Call Attempt #1 and Complex Care Management (Outreach to schedule referral with pharmacist )   Nicole Bryant is a 53 y.o. year old female who is a primary care patient of [name] [name], MD.  Nicole Bryant was referred to the pharmacist for assistance related to: DMII  Successful contact was made with the patient to discuss pharmacy services including being ready for the pharmacist to call at least 5 minutes before the scheduled appointment time and to have medication bottles and any blood pressure readings ready for review. The patient agreed to meet with the pharmacist via telephone visit on 03/18/2024  Thedford Franks, CMA [place]  [facility], [facility], [place] Guide Direct Dial: [phone]  Fax: [phone] Website: [email or web address]

## 2024-03-14 NOTE — Telephone Encounter (Signed)
 [email or web address] samples has been picked up.

## 2024-03-18 ENCOUNTER — Other Ambulatory Visit

## 2024-03-25 ENCOUNTER — Encounter: Payer: Self-pay | Admitting: Internal Medicine

## 2024-03-25 NOTE — Telephone Encounter (Signed)
 Libre report printed placed in folder for review

## 2024-03-26 ENCOUNTER — Encounter: Payer: Self-pay | Admitting: Internal Medicine

## 2024-03-26 ENCOUNTER — Other Ambulatory Visit: Payer: Self-pay | Admitting: Internal Medicine

## 2024-03-26 MED ORDER — GLIPIZIDE ER 5 MG PO TB24: 5.0000 mg | ORAL_TABLET | Freq: Every day | ORAL | 1 refills | Status: DC

## 2024-04-01 ENCOUNTER — Telehealth: Payer: Self-pay

## 2024-04-01 NOTE — Progress Notes (Unsigned)
 Complex Care Management Note Care Guide Note  04/01/2024 Name: Nicole Bryant MRN: [id number] DOB: 01-Aug-1970   Complex Care Management Outreach Attempts: An unsuccessful telephone outreach was attempted today to offer the patient information about available complex care management services.  Follow Up Plan:  Additional outreach attempts will be made to offer the patient complex care management information and services.   Encounter Outcome:  No Answer  Dreama [facility]  [facility], [facility] VBCI Assistant Direct Dial: [phone]  Fax: [phone]

## 2024-04-02 ENCOUNTER — Other Ambulatory Visit

## 2024-04-03 ENCOUNTER — Encounter: Payer: Self-pay | Admitting: Pharmacist

## 2024-04-03 NOTE — Progress Notes (Signed)
 Patient referred to pharmacy for assistance with diabetes management in the setting of cost barriers to brand-name medication.   Patient declines pharmacy services at this time stating she plans to get a new insurance plan in 2026.  ______________________________  Most brand medications are showing costs of ~$15-25/month using savings cards. 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

## 2024-04-03 NOTE — Progress Notes (Signed)
 Complex Care Management Note Care Guide Note  04/03/2024 [name] MRN: [id number] DOB: 1970/10/03   Complex Care Management Outreach Attempts: A second unsuccessful outreach was attempted today to offer the patient with information about available complex care management services.  Follow Up Plan:  No further outreach attempts will be made at this time. We have been unable to contact the patient to offer or enroll patient in complex care management services.  Encounter Outcome:  No Answer  Dreama [facility]  [facility] : [facility], [facility] VBCI Assistant Direct Dial: [phone]  Fax: [phone]

## 2024-04-04 ENCOUNTER — Encounter: Payer: Self-pay | Admitting: Internal Medicine

## 2024-05-03 ENCOUNTER — Other Ambulatory Visit: Payer: Self-pay | Admitting: Internal Medicine

## 2024-05-03 DIAGNOSIS — E1165 Type 2 diabetes mellitus with hyperglycemia: Secondary | ICD-10-CM

## 2024-05-03 NOTE — Assessment & Plan Note (Signed)
 Dose increased to 5 mg in November based on review of CBG

## 2024-05-06 ENCOUNTER — Other Ambulatory Visit: Payer: Self-pay

## 2024-05-06 DIAGNOSIS — E1165 Type 2 diabetes mellitus with hyperglycemia: Secondary | ICD-10-CM

## 2024-05-06 MED ORDER — GLIPIZIDE ER 5 MG PO TB24: 5.0000 mg | ORAL_TABLET | Freq: Every day | ORAL | 1 refills | Status: AC

## 2024-05-31 ENCOUNTER — Other Ambulatory Visit: Payer: Self-pay | Admitting: Internal Medicine

## 2024-06-04 ENCOUNTER — Encounter: Payer: Self-pay | Admitting: Internal Medicine

## 2024-06-05 MED ORDER — ALBUTEROL SULFATE HFA 108 (90 BASE) MCG/ACT IN AERS: 1.0000 | INHALATION_SPRAY | Freq: Four times a day (QID) | RESPIRATORY_TRACT | 0 refills | Status: AC | PRN
